# Patient Record
Sex: Female | Born: 1945 | Race: White | Hispanic: No | Marital: Single | Smoking: Never smoker
Health system: Southern US, Community
[De-identification: ages and names within clinical notes are randomized; demographics above are authoritative.]

## PROBLEM LIST (undated history)

## (undated) DIAGNOSIS — D649 Anemia, unspecified: Secondary | ICD-10-CM

## (undated) DIAGNOSIS — Z5189 Encounter for other specified aftercare: Secondary | ICD-10-CM

## (undated) DIAGNOSIS — J189 Pneumonia, unspecified organism: Secondary | ICD-10-CM

## (undated) DIAGNOSIS — I10 Essential (primary) hypertension: Secondary | ICD-10-CM

## (undated) DIAGNOSIS — C189 Malignant neoplasm of colon, unspecified: Secondary | ICD-10-CM

## (undated) DIAGNOSIS — C569 Malignant neoplasm of unspecified ovary: Secondary | ICD-10-CM

## (undated) DIAGNOSIS — R0602 Shortness of breath: Secondary | ICD-10-CM

## (undated) DIAGNOSIS — E119 Type 2 diabetes mellitus without complications: Secondary | ICD-10-CM

## (undated) DIAGNOSIS — E785 Hyperlipidemia, unspecified: Secondary | ICD-10-CM

## (undated) HISTORY — PX: COLON SURGERY: SHX602

## (undated) HISTORY — DX: Hyperlipidemia, unspecified: E78.5

## (undated) HISTORY — DX: Encounter for other specified aftercare: Z51.89

## (undated) HISTORY — DX: Pneumonia, unspecified organism: J18.9

## (undated) HISTORY — PX: WISDOM TOOTH EXTRACTION: SHX21

## (undated) HISTORY — DX: Essential (primary) hypertension: I10

## (undated) HISTORY — DX: Type 2 diabetes mellitus without complications: E11.9

---

## 2011-07-14 DIAGNOSIS — C569 Malignant neoplasm of unspecified ovary: Secondary | ICD-10-CM

## 2011-07-14 DIAGNOSIS — C189 Malignant neoplasm of colon, unspecified: Secondary | ICD-10-CM

## 2011-07-14 HISTORY — DX: Malignant neoplasm of colon, unspecified: C18.9

## 2011-07-14 HISTORY — PX: PORTACATH PLACEMENT: SHX2246

## 2011-07-14 HISTORY — DX: Malignant neoplasm of unspecified ovary: C56.9

## 2011-12-15 ENCOUNTER — Encounter: Payer: Self-pay | Admitting: Internal Medicine

## 2012-01-12 ENCOUNTER — Ambulatory Visit (AMBULATORY_SURGERY_CENTER): Payer: Medicare Other | Admitting: *Deleted

## 2012-01-12 VITALS — Ht 63.5 in | Wt 169.8 lb

## 2012-01-12 DIAGNOSIS — Z1211 Encounter for screening for malignant neoplasm of colon: Secondary | ICD-10-CM

## 2012-01-26 ENCOUNTER — Encounter: Payer: Self-pay | Admitting: Internal Medicine

## 2012-01-26 ENCOUNTER — Ambulatory Visit (AMBULATORY_SURGERY_CENTER): Payer: Medicare Other | Admitting: Internal Medicine

## 2012-01-26 VITALS — BP 110/59 | HR 100 | Temp 98.1°F | Resp 15 | Ht 63.5 in | Wt 169.0 lb

## 2012-01-26 DIAGNOSIS — Z1211 Encounter for screening for malignant neoplasm of colon: Secondary | ICD-10-CM

## 2012-01-26 DIAGNOSIS — K633 Ulcer of intestine: Secondary | ICD-10-CM

## 2012-01-26 DIAGNOSIS — K5730 Diverticulosis of large intestine without perforation or abscess without bleeding: Secondary | ICD-10-CM

## 2012-01-26 MED ORDER — SODIUM CHLORIDE 0.9 % IV SOLN: 500.0000 mL | INTRAVENOUS | Status: DC

## 2012-01-26 NOTE — Patient Instructions (Addendum)
There was an ulcer in the colon - the right side of the colon. I have taken biopsies to find out what is causing it. You should hear from me about the biopsy results and plans this week.  Thank you for choosing me and [facility].  Iva Boop, MD, FACG   YOU HAD AN ENDOSCOPIC PROCEDURE TODAY AT THE [facility]: Refer to the procedure report that was given to you for any specific questions about what was found during the examination.  If the procedure report does not answer your questions, please call your gastroenterologist to clarify.  If you requested that your care partner not be given the details of your procedure findings, then the procedure report has been included in a sealed envelope for you to review at your convenience later.  YOU SHOULD EXPECT: Some feelings of bloating in the abdomen. Passage of more gas than usual.  Walking can help get rid of the air that was put into your GI tract during the procedure and reduce the bloating. If you had a lower endoscopy (such as a colonoscopy or flexible sigmoidoscopy) you may notice spotting of blood in your stool or on the toilet paper. If you underwent a bowel prep for your procedure, then you may not have a normal bowel movement for a few days.  DIET: Your first meal following the procedure should be a light meal and then it is ok to progress to your normal diet.  A half-sandwich or bowl of soup is an example of a good first meal.  Heavy or fried foods are harder to digest and may make you feel nauseous or bloated.  Likewise meals heavy in dairy and vegetables can cause extra gas to form and this can also increase the bloating.  Drink plenty of fluids but you should avoid alcoholic beverages for 24 hours.  ACTIVITY: Your care partner should take you home directly after the procedure.  You should plan to take it easy, moving slowly for the rest of the day.  You can resume normal activity the day after the procedure  however you should NOT DRIVE or use heavy machinery for 24 hours (because of the sedation medicines used during the test).    SYMPTOMS TO REPORT IMMEDIATELY: A gastroenterologist can be reached at any hour.  During normal business hours, 8:30 AM to 5:00 PM Monday through Friday, call [phone].  After hours and on weekends, please call the GI answering service at [phone] who will take a message and have the physician on call contact you.   Following lower endoscopy (colonoscopy or flexible sigmoidoscopy):  Excessive amounts of blood in the stool  Significant tenderness or worsening of abdominal pains  Swelling of the abdomen that is new, acute  Fever of 100F or higher  Following upper endoscopy (EGD)  Vomiting of blood or coffee ground material  New chest pain or pain under the shoulder blades  Painful or persistently difficult swallowing  New shortness of breath  Fever of 100F or higher  Black, tarry-looking stools  FOLLOW UP: If any biopsies were taken you will be contacted by phone or by letter within the next 1-3 weeks.  Call your gastroenterologist if you have not heard about the biopsies in 3 weeks.  Our staff will call the home number listed on your records the next business day following your procedure to check on you and address any questions or concerns that you may have at that time regarding the information  given to you following your procedure. This is a courtesy call and so if there is no answer at the home number and we have not heard from you through the emergency physician on call, we will assume that you have returned to your regular daily activities without incident.  SIGNATURES/CONFIDENTIALITY: You and/or your care partner have signed paperwork which will be entered into your electronic medical record.  These signatures attest to the fact that that the information above on your After Visit Summary has been reviewed and is understood.  Full responsibility of  the confidentiality of this discharge information lies with you and/or your care-partner.   Diverticulosis, high fiber diet, hemorrhoid information given.

## 2012-01-26 NOTE — Progress Notes (Signed)
PT. Stated that she noticed a knot under her nabel 2 weeks ago and her stomach stays swollen. She stated she told her primary doctor and she said her doctor stated it is coming from IBS. Instructed pt. To inform DR. Leone Payor when she talk to him prior to procedure,

## 2012-01-26 NOTE — Progress Notes (Signed)
Patient did not experience any of the following events: a burn prior to discharge; a fall within the facility; wrong site/side/patient/procedure/implant event; or a hospital transfer or hospital admission upon discharge from the facility. (G8907) Patient did not have preoperative order for IV antibiotic SSI prophylaxis. (G8918)  

## 2012-01-26 NOTE — Op Note (Signed)
[facility] [address] [place], [place]  [address]  COLONOSCOPY PROCEDURE REPORT  PATIENT:  Ann Baldwin, Ann Baldwin  MR#:  [id number] BIRTHDATE:  27-Oct-1945, 66 yrs. old  GENDER:  female ENDOSCOPIST:  Iva Boop, MD, [facility] REF. BY:  Paulita Cradle, N.P. PROCEDURE DATE:  01/26/2012 PROCEDURE:  Colonoscopy with biopsy ASA CLASS:  Class III INDICATIONS:  Routine Risk Screening MEDICATIONS:   These medications were titrated to patient response per physician's verbal order, MAC sedation, administered by CRNA, propofol (Diprivan) 250 mg IV  DESCRIPTION OF PROCEDURE:   After the risks benefits and alternatives of the procedure were thoroughly explained, informed consent was obtained.  Digital rectal exam was performed and revealed no abnormalities.   The LB [id number]-[id number] endoscope was introduced through the anus and advanced to the cecum, which was identified by both the appendix and ileocecal valve, without limitations.  The quality of the prep was excellent, using MoviPrep.  The instrument was then slowly withdrawn as the colon was fully examined. <<PROCEDUREIMAGES>>  FINDINGS:  An ulcer was found in the ascending colon. Large ulcer, firm with heaped up margins. Difficult to see and obtain biopsies. Position changes and abdominal pressure utilized.  This was otherwise a normal examination of the colon, though colon was redundant.  Retroflexed views in the rectum revealed internal and external hemorrhoids.    The time to cecum = 12:20 minutes. The scope was then withdrawn in 17 minutes from the cecum and the procedure completed. COMPLICATIONS:  None ENDOSCOPIC IMPRESSION: 1) Ulcer in the ascending colon - biopsied 2) Internal and external hemorrhoids 3) Otherwise normal examination - excellent prep RECOMMENDATIONS: 1) Await biopsy results 2) I will call with results and plans. REPEAT EXAM:  In for Colonoscopy, pending biopsy results.  Iva Boop, MD,  Clementeen Graham  CC:  Paulita Cradle, NP and The Patient  n. eSIGNED:   Iva Boop at 01/26/2012 10:58 AM  Clydene Fake, [id number]

## 2012-01-26 NOTE — Progress Notes (Signed)
1026 SA02 100%, CONTINUES WITH MASK 02

## 2012-01-26 NOTE — Progress Notes (Signed)
1020PATIENT DESATURATED TO 83, INCREASE OF 02 AND MASK APPLIED, SA02 INCREASED TO 94%. COLOR REMAINS THE SAME AS PREPROCEDURE. SKIN WARM DRY, NAILBEDS PINK.RESP SLOW EVEN

## 2012-01-27 ENCOUNTER — Telehealth: Payer: Self-pay | Admitting: *Deleted

## 2012-01-27 NOTE — Telephone Encounter (Signed)
  Follow up Call-  Call back number 01/26/2012  Post procedure Call Back phone  # [phone]  Permission to leave phone message Yes     [facility]

## 2012-01-29 ENCOUNTER — Other Ambulatory Visit: Payer: Self-pay

## 2012-01-29 DIAGNOSIS — R140 Abdominal distension (gaseous): Secondary | ICD-10-CM

## 2012-01-29 DIAGNOSIS — K633 Ulcer of intestine: Secondary | ICD-10-CM

## 2012-01-29 DIAGNOSIS — R188 Other ascites: Secondary | ICD-10-CM

## 2012-01-29 NOTE — Progress Notes (Signed)
Quick Note:  I called result to her Discussed with Paulita Cradle, NP at [facility]  BUN 15, creat 1.48 and GFR 36 on 01/18/12  Office:  Please order CT abd/pelvis with contrast re: malignant right colon ulcer, abdominal distention, suspected ascites, ? Metastatic cancer  Patient expecting call  [facility] - no recall or letter ______

## 2012-01-29 NOTE — Progress Notes (Signed)
Have set up patient's CT abd/pelvis with contrast for 02/01/12, arrive at 11:15am for a 1:30pm scan at [facility] located at [address], [place] [address].  Have tried to call pt and LM for them to call me back.  Will continue to try and reach patient until I contact her.

## 2012-02-01 ENCOUNTER — Ambulatory Visit (INDEPENDENT_AMBULATORY_CARE_PROVIDER_SITE_OTHER)
Admission: RE | Admit: 2012-02-01 | Discharge: 2012-02-01 | Disposition: A | Discharge status: Home / Self Care | Payer: Medicaid Other | Source: Ambulatory Visit | Attending: Internal Medicine | Admitting: Internal Medicine

## 2012-02-01 DIAGNOSIS — R140 Abdominal distension (gaseous): Secondary | ICD-10-CM

## 2012-02-01 DIAGNOSIS — R141 Gas pain: Secondary | ICD-10-CM

## 2012-02-01 DIAGNOSIS — R188 Other ascites: Secondary | ICD-10-CM

## 2012-02-01 DIAGNOSIS — K633 Ulcer of intestine: Secondary | ICD-10-CM

## 2012-02-01 MED ORDER — IOHEXOL 300 MG/ML  SOLN
80.0000 mL | Freq: Once | INTRAMUSCULAR | Status: AC | PRN
  Administered 2012-02-01: 80 mL via INTRAVENOUS

## 2012-02-01 NOTE — Progress Notes (Signed)
Quick Note:  I called results Will get GYN Oncology appointment   ______

## 2012-02-02 ENCOUNTER — Other Ambulatory Visit: Payer: Self-pay | Admitting: *Deleted

## 2012-02-02 ENCOUNTER — Telehealth: Payer: Self-pay | Admitting: *Deleted

## 2012-02-02 DIAGNOSIS — K633 Ulcer of intestine: Secondary | ICD-10-CM

## 2012-02-02 DIAGNOSIS — R933 Abnormal findings on diagnostic imaging of other parts of digestive tract: Secondary | ICD-10-CM

## 2012-02-02 NOTE — Progress Notes (Signed)
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

## 2012-02-02 NOTE — Telephone Encounter (Signed)
Spoke with Dagmar Hait at [facility] oncology and the patient can be seen on 02/09/12 9:00/9:30 AM by Dr. Valentino Nose. Valet parking is available. Patient notified.

## 2012-02-03 ENCOUNTER — Telehealth: Payer: Self-pay | Admitting: Internal Medicine

## 2012-02-03 NOTE — Telephone Encounter (Signed)
Forward 3 pages to Dr. Stan Head for review on 02-03-12 ym

## 2012-02-08 ENCOUNTER — Encounter: Payer: Self-pay | Admitting: *Deleted

## 2012-02-09 ENCOUNTER — Other Ambulatory Visit (HOSPITAL_COMMUNITY)
Admission: RE | Admit: 2012-02-09 | Discharge: 2012-02-09 | Disposition: A | Discharge status: Home / Self Care | Payer: Medicare Other | Source: Ambulatory Visit | Attending: Gynecologic Oncology | Admitting: Gynecologic Oncology

## 2012-02-09 ENCOUNTER — Ambulatory Visit (HOSPITAL_BASED_OUTPATIENT_CLINIC_OR_DEPARTMENT_OTHER): Payer: Medicare Other | Admitting: Lab

## 2012-02-09 ENCOUNTER — Encounter: Payer: Self-pay | Admitting: Gynecologic Oncology

## 2012-02-09 ENCOUNTER — Ambulatory Visit: Payer: Medicare Other | Admitting: Gynecologic Oncology

## 2012-02-09 VITALS — BP 100/58 | HR 98 | Temp 97.5°F | Resp 18 | Ht 63.78 in | Wt 168.5 lb

## 2012-02-09 DIAGNOSIS — K6389 Other specified diseases of intestine: Secondary | ICD-10-CM

## 2012-02-09 DIAGNOSIS — R197 Diarrhea, unspecified: Secondary | ICD-10-CM

## 2012-02-09 DIAGNOSIS — K633 Ulcer of intestine: Secondary | ICD-10-CM

## 2012-02-09 DIAGNOSIS — Z124 Encounter for screening for malignant neoplasm of cervix: Secondary | ICD-10-CM | POA: Insufficient documentation

## 2012-02-09 DIAGNOSIS — E119 Type 2 diabetes mellitus without complications: Secondary | ICD-10-CM | POA: Insufficient documentation

## 2012-02-09 DIAGNOSIS — C786 Secondary malignant neoplasm of retroperitoneum and peritoneum: Secondary | ICD-10-CM | POA: Insufficient documentation

## 2012-02-09 DIAGNOSIS — R1909 Other intra-abdominal and pelvic swelling, mass and lump: Secondary | ICD-10-CM

## 2012-02-09 DIAGNOSIS — E785 Hyperlipidemia, unspecified: Secondary | ICD-10-CM | POA: Insufficient documentation

## 2012-02-09 DIAGNOSIS — C260 Malignant neoplasm of intestinal tract, part unspecified: Secondary | ICD-10-CM

## 2012-02-09 DIAGNOSIS — R188 Other ascites: Secondary | ICD-10-CM

## 2012-02-09 DIAGNOSIS — I10 Essential (primary) hypertension: Secondary | ICD-10-CM | POA: Insufficient documentation

## 2012-02-09 LAB — CEA: CEA: 0.5 ng/mL (ref 0.0–5.0)

## 2012-02-09 MED ORDER — HYDROCODONE-ACETAMINOPHEN 5-325 MG PO TABS: 1.0000 | ORAL_TABLET | ORAL | Status: DC | PRN

## 2012-02-09 MED ORDER — ONDANSETRON HCL 8 MG PO TABS: 4.0000 mg | ORAL_TABLET | Freq: Three times a day (TID) | ORAL | Status: DC | PRN

## 2012-02-09 NOTE — Patient Instructions (Addendum)
Immunostains will be collected from the biopsy. CEA, CA 125 Patient will be presented at GI tumor board.  Had a discussion with Dr. Gaynelle Adu.  If this represents metastatic colon cancer neoadjuvant chemotherapy is the best treatment course.   Patient to f/u 02/18/2012 with Gyn Onc for treatment planning. Referral to Dr. Darrold Span for neoadjuvant chemotherapy   Thank you very much Ms. [name] for allowing me to provide care for you today.  I appreciate your confidence in choosing our Gynecologic Oncology team.  If you have any questions about your visit today please call our office and we will get back to you as soon as possible.  Maryclare Labrador. Korin Hartwell MD., PhD Gynecologic Oncology

## 2012-02-09 NOTE — Progress Notes (Signed)
Consult Note: Gyn-Onc  Consult was requested by Dr. Leone Payor for the evaluation of Ann Baldwin 66 y.o. female  CC:  Chief Complaint  Patient presents with  . Ascites    New consult    HPI: 66 y/o G2P2 LNMP 40's  presented in April 2013 with abdominal discomfort, diarrhea, nausea, no constipation  that was thought to be IBD. The symptoms persisted and she was referred for colonoscopy 01/26/2012.  Findings were notable for  A large ulcer with heaped up margins in the ascending colon.  It was difficult to see and to obtain biopsies.    Pathology was c/w adenocarcinoma and the concern that it was not a primary colonic adenocarcinoma.  The patient denies rectal bleeding or weight loss, she reports poor appetite, abdominal distension for two months.  Early satiety.  No vaginal bleeding.  CT abdomen and pelvis 02/01/2012 with a 5x6.2 cmcomplex solid and cystic left adnexal mass, 3.9x2.9 right ovarian soft tissue mass, omental caking and peritoneal carcinomatosis.    Current Meds:  Outpatient Encounter Prescriptions as of 02/09/2012  Medication Sig Dispense Refill  . amLODipine-benazepril (LOTREL) 10-20 MG per capsule Take 1 capsule by mouth daily.      Marland Kitchen aspirin 81 MG tablet Take 81 mg by mouth daily.      Marland Kitchen atorvastatin (LIPITOR) 40 MG tablet Take 40 mg by mouth daily.      Marland Kitchen ezetimibe (ZETIA) 10 MG tablet Take 10 mg by mouth daily.      . furosemide (LASIX) 20 MG tablet Take 20 mg by mouth daily.      Marland Kitchen ibuprofen (ADVIL,MOTRIN) 600 MG tablet Take 600 mg by mouth every 6 (six) hours as needed.      . lubiprostone (AMITIZA) 8 MCG capsule Take 8 mcg by mouth 2 (two) times daily with a meal.      . sitaGLIPtin (JANUVIA) 100 MG tablet Take 100 mg by mouth daily.        Allergy: No Known Allergies  Social Hx:   History   Social History  . Marital Status: Single    Spouse Name: N/A    Number of Children: N/A  . Years of Education: N/A   Occupational History  . Not on file.   Social  History Main Topics  . Smoking status: Never Smoker   . Smokeless tobacco: Never Used  . Alcohol Use: No  . Drug Use: No  . Sexually Active: No   Other Topics Concern  . Not on file   Social History Narrative  . No narrative on file    Past Surgical Hx: No past surgical history on file.  Past Medical Hx:  Past Medical History  Diagnosis Date  . Diabetes mellitus   . Hyperlipidemia   . Hypertension   Last mammogram 2 years ago wnl.  Past Gynecological History: G2 P2 LNMP in 40's.  Menarche 12 regular menses until menopause.  No contraceptives used.  No h/o abnormal pap test, Last pap 10 years ago.  NSVD x 2.  No LMP recorded. Patient is postmenopausal.  Family Hx:  Family History  Problem Relation Age of Onset  . Colon cancer Neg Hx   . Esophageal cancer Neg Hx   . Stomach cancer Neg Hx   . Rectal cancer Neg Hx     Review of Systems:  Constitutional  Feels poorly Cardiovascular  No chest pain, shortness of breath, sleeps on t wo pillows. Pulmonary  Reports cough with yellow sputum. No wheeze.  Gastro Intestinal  Reports nausea, dry heaves and diarrhoea. No bright red blood per rectum.  Reports abdominal tightness.  Flat pencil like stool. No constipation.  Genito Urinary  No frequency, urgency, dysuria, no vaginal bleeding or discharge Musculo Skeletal  No myalgia, arthralgia, joint swelling or pain  Neurologic  No weakness, numbness, change in gait,  Psychology  No depression, anxiety,difficulty sleeping.   Vitals:  Blood pressure 100/58, pulse 98, temperature 97.5 F (36.4 C), temperature source Oral, resp. rate 18, height 5' 3.78" (1.62 m), weight 168 lb 8 oz (76.431 kg).  Physical Exam: Thin WD in NAD.  Bitemporal wasting. Neck  Supple NROM, without any enlargements.  Lymph Node Survey No cervical supraclavicular or inguinal adenopathy Cardiovascular  Pulse normal rate, regularity and rhythm. Lungs  Clear to auscultation bilateraly, without  wheezes/crackles/rhonchi.DEcreased breath sounds at bases Skin  Poor skin turgor.  Evidence of weigh loss. Psychiatry  Alert and oriented to person, place, and time, flat affect. Abdomen  Distended c/w ascites.  Normoactive bowel sounds, abdomen soft, non-tender and obese. No palpable masses. Back No CVA tenderness Genito Urinary  Vulva/vagina: Normal external female genitalia.  No lesions. No discharge or bleeding.  Bladder/urethra:  No lesions or masses  Vagina:atrophic, no masses, discharge or bleeding.  Cervix: Normal appearing, no lesions.  Uterus: Small, mobile, no parametrial involvement or nodularity.  Adnexa: No palpable masses. Rectal  Good tone, no masses no cul de sac nodularity.  Extremities  No bilateral cyanosis, clubbing or edema.   Assessment/Plan:  Ann Baldwin  is a 66 y.o.  year old with ascites,an  ulcerative lesion of the ascending colon and diffuse  peritotoneal carcinomatosis.  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  Dr. Colonel Bald will perform additional immunostains noted on pathology report.  Per his communication with Dr. Leone Payor then was suspicion that the lesion was invading into the colon.   Call placed to Dr. Colonel Bald to request additional immunostains. CEA, CA 125 Patient will be presented at GI tumor board.  Had a discussion with Dr. Gaynelle Adu.  If this represents metastatic colon cancer neoadjuvant chemotherapy is the best treatment course.   If this is a Gyn primary the options are neoadjuvant chemotherapy versus primary debulking. F/U 02/18/2012       Laurette Schimke, MD, PhD 02/09/2012, 9:24 AM

## 2012-02-10 ENCOUNTER — Other Ambulatory Visit: Payer: Self-pay | Admitting: *Deleted

## 2012-02-10 ENCOUNTER — Telehealth: Payer: Self-pay | Admitting: Gynecologic Oncology

## 2012-02-10 ENCOUNTER — Encounter (HOSPITAL_COMMUNITY): Payer: Self-pay | Admitting: *Deleted

## 2012-02-10 ENCOUNTER — Emergency Department (HOSPITAL_COMMUNITY): Payer: Medicare Other

## 2012-02-10 ENCOUNTER — Other Ambulatory Visit: Payer: Self-pay | Admitting: Lab

## 2012-02-10 ENCOUNTER — Other Ambulatory Visit: Payer: Self-pay

## 2012-02-10 ENCOUNTER — Inpatient Hospital Stay (HOSPITAL_COMMUNITY)
Admission: EM | Admit: 2012-02-10 | Discharge: 2012-02-17 | DRG: 683 | Disposition: A | Discharge status: Home / Self Care | Payer: Medicare Other | Attending: Internal Medicine | Admitting: Internal Medicine

## 2012-02-10 DIAGNOSIS — E785 Hyperlipidemia, unspecified: Secondary | ICD-10-CM | POA: Diagnosis present

## 2012-02-10 DIAGNOSIS — C786 Secondary malignant neoplasm of retroperitoneum and peritoneum: Secondary | ICD-10-CM | POA: Diagnosis present

## 2012-02-10 DIAGNOSIS — N179 Acute kidney failure, unspecified: Secondary | ICD-10-CM

## 2012-02-10 DIAGNOSIS — N838 Other noninflammatory disorders of ovary, fallopian tube and broad ligament: Secondary | ICD-10-CM | POA: Diagnosis present

## 2012-02-10 DIAGNOSIS — R110 Nausea: Secondary | ICD-10-CM | POA: Diagnosis present

## 2012-02-10 DIAGNOSIS — N19 Unspecified kidney failure: Secondary | ICD-10-CM

## 2012-02-10 DIAGNOSIS — C569 Malignant neoplasm of unspecified ovary: Secondary | ICD-10-CM | POA: Diagnosis present

## 2012-02-10 DIAGNOSIS — E119 Type 2 diabetes mellitus without complications: Secondary | ICD-10-CM | POA: Diagnosis present

## 2012-02-10 DIAGNOSIS — R1900 Intra-abdominal and pelvic swelling, mass and lump, unspecified site: Secondary | ICD-10-CM

## 2012-02-10 DIAGNOSIS — I959 Hypotension, unspecified: Secondary | ICD-10-CM | POA: Diagnosis present

## 2012-02-10 DIAGNOSIS — R0902 Hypoxemia: Secondary | ICD-10-CM | POA: Diagnosis present

## 2012-02-10 DIAGNOSIS — N839 Noninflammatory disorder of ovary, fallopian tube and broad ligament, unspecified: Secondary | ICD-10-CM

## 2012-02-10 DIAGNOSIS — I10 Essential (primary) hypertension: Secondary | ICD-10-CM | POA: Diagnosis present

## 2012-02-10 DIAGNOSIS — R188 Other ascites: Secondary | ICD-10-CM | POA: Diagnosis present

## 2012-02-10 HISTORY — DX: Malignant neoplasm of unspecified ovary: C56.9

## 2012-02-10 LAB — CBC
HCT: 31.8 % — ABNORMAL LOW (ref 36.0–46.0)
Hemoglobin: 10.7 g/dL — ABNORMAL LOW (ref 12.0–15.0)
MCH: 29.1 pg (ref 26.0–34.0)
MCHC: 33.6 g/dL (ref 30.0–36.0)
MCV: 86.4 fL (ref 78.0–100.0)
Platelets: 407 10*3/uL — ABNORMAL HIGH (ref 150–400)
RBC: 3.68 MIL/uL — ABNORMAL LOW (ref 3.87–5.11)
RDW: 15.7 % — ABNORMAL HIGH (ref 11.5–15.5)
WBC: 5.3 10*3/uL (ref 4.0–10.5)

## 2012-02-10 LAB — URINALYSIS, ROUTINE W REFLEX MICROSCOPIC
Glucose, UA: NEGATIVE mg/dL
Leukocytes, UA: NEGATIVE
Nitrite: NEGATIVE
Protein, ur: NEGATIVE mg/dL
Specific Gravity, Urine: 1.032 — ABNORMAL HIGH (ref 1.005–1.030)
Urobilinogen, UA: 0.2 mg/dL (ref 0.0–1.0)
pH: 5 (ref 5.0–8.0)

## 2012-02-10 LAB — BASIC METABOLIC PANEL
BUN: 68 mg/dL — ABNORMAL HIGH (ref 6–23)
CO2: 17 mEq/L — ABNORMAL LOW (ref 19–32)
Calcium: 9 mg/dL (ref 8.4–10.5)
Chloride: 97 mEq/L (ref 96–112)
Creatinine, Ser: 9.03 mg/dL — ABNORMAL HIGH (ref 0.50–1.10)
GFR calc Af Amer: 5 mL/min — ABNORMAL LOW (ref >90)
GFR calc non Af Amer: 4 mL/min — ABNORMAL LOW (ref >90)
Glucose, Bld: 93 mg/dL (ref 70–99)
Potassium: 4.3 mEq/L (ref 3.5–5.1)
Sodium: 135 mEq/L (ref 135–145)

## 2012-02-10 LAB — MAGNESIUM: Magnesium: 1.7 mg/dL (ref 1.5–2.5)

## 2012-02-10 LAB — COMPREHENSIVE METABOLIC PANEL
AST: 9 U/L (ref 0–37)
BUN: 63 mg/dL — ABNORMAL HIGH (ref 6–23)
CO2: 18 mEq/L — ABNORMAL LOW (ref 19–32)
Calcium: 8.8 mg/dL (ref 8.4–10.5)
Chloride: 101 mEq/L (ref 96–112)
Creatinine, Ser: 8.78 mg/dL — ABNORMAL HIGH (ref 0.50–1.10)

## 2012-02-10 LAB — URINE MICROSCOPIC-ADD ON

## 2012-02-10 LAB — GLUCOSE, CAPILLARY: Glucose-Capillary: 78 mg/dL (ref 70–99)

## 2012-02-10 LAB — PHOSPHORUS: Phosphorus: 7.5 mg/dL — ABNORMAL HIGH (ref 2.3–4.6)

## 2012-02-10 MED ORDER — ONDANSETRON HCL 4 MG PO TABS
4.0000 mg | ORAL_TABLET | Freq: Three times a day (TID) | ORAL | Status: DC | PRN
  Administered 2012-02-11 – 2012-02-16 (×3): 4 mg via ORAL
  Filled 2012-02-10 (×3): qty 1

## 2012-02-10 MED ORDER — ATORVASTATIN CALCIUM 40 MG PO TABS
40.0000 mg | ORAL_TABLET | Freq: Every day | ORAL | Status: DC
  Administered 2012-02-11 – 2012-02-17 (×7): 40 mg via ORAL
  Filled 2012-02-10 (×7): qty 1

## 2012-02-10 MED ORDER — SODIUM CHLORIDE 0.9 % IV SOLN
INTRAVENOUS | Status: AC
  Administered 2012-02-10 – 2012-02-11 (×2): via INTRAVENOUS

## 2012-02-10 MED ORDER — SODIUM CHLORIDE 0.9 % IJ SOLN
3.0000 mL | Freq: Two times a day (BID) | INTRAMUSCULAR | Status: DC
  Administered 2012-02-10 – 2012-02-17 (×7): 3 mL via INTRAVENOUS

## 2012-02-10 MED ORDER — EZETIMIBE 10 MG PO TABS
10.0000 mg | ORAL_TABLET | Freq: Every day | ORAL | Status: DC
  Administered 2012-02-11 – 2012-02-17 (×7): 10 mg via ORAL
  Filled 2012-02-10 (×7): qty 1

## 2012-02-10 MED ORDER — SODIUM CHLORIDE 0.9 % IV BOLUS (SEPSIS)
1000.0000 mL | Freq: Once | INTRAVENOUS | Status: AC
  Administered 2012-02-10: 1000 mL via INTRAVENOUS

## 2012-02-10 MED ORDER — ZOLPIDEM TARTRATE 5 MG PO TABS
5.0000 mg | ORAL_TABLET | Freq: Every evening | ORAL | Status: DC | PRN
  Administered 2012-02-10 – 2012-02-16 (×4): 5 mg via ORAL
  Filled 2012-02-10 (×4): qty 1

## 2012-02-10 MED ORDER — HYDROCODONE-ACETAMINOPHEN 5-325 MG PO TABS
1.0000 | ORAL_TABLET | ORAL | Status: DC | PRN
  Administered 2012-02-10 – 2012-02-16 (×9): 1 via ORAL
  Filled 2012-02-10 (×9): qty 1

## 2012-02-10 MED ORDER — ENOXAPARIN SODIUM 30 MG/0.3ML ~~LOC~~ SOLN
30.0000 mg | SUBCUTANEOUS | Status: DC
  Administered 2012-02-10 – 2012-02-16 (×7): 30 mg via SUBCUTANEOUS
  Filled 2012-02-10 (×8): qty 0.3

## 2012-02-10 MED ORDER — INSULIN ASPART 100 UNIT/ML ~~LOC~~ SOLN
0.0000 [IU] | Freq: Three times a day (TID) | SUBCUTANEOUS | Status: DC
  Administered 2012-02-12 – 2012-02-17 (×5): 1 [IU] via SUBCUTANEOUS

## 2012-02-10 NOTE — Telephone Encounter (Signed)
Ann Baldwin was  informed that there is renal insufficiency creatinine 8.78.  Advised to bring her mother to the ER as [name] for admission to the hospitalist service.

## 2012-02-10 NOTE — ED Notes (Signed)
MD at bedside. 

## 2012-02-10 NOTE — ED Notes (Signed)
Placed fall risk bracelet and red socks on pt.  

## 2012-02-10 NOTE — H&P (Addendum)
Ann Baldwin is an 66 y.o. female.   Chief Complaint: ARF HPI: 66 yo female with Dm2, Hypertension, Hyperlipidemia, new dx of ovarian mass likely ovarian cancer has ARF.  Told by pcp to come to ER.  Note that creatiine 1.48 on 01/18/2012 per notes in computer.  Pt has been taking motrin up until 2-3 days ago.  Pt had CT scan with dye exposure.  Pt has had n/v, diarrhea for 1 month.   Pt denies fever, chills, cp, palp, sob, orthopnea, lower ext edema.   In ED, pt found t ohave creatinine 9.03.  Pt will be admitted for ARF, and w/up of ovarian masses.   Past Medical History  Diagnosis Date  . Diabetes mellitus   . Hyperlipidemia   . Hypertension   . Ovarian cancer     Past Surgical History  Procedure Date  . No past surgeries     Family History  Problem Relation Age of Onset  . Colon cancer Neg Hx   . Esophageal cancer Neg Hx   . Stomach cancer Neg Hx   . Rectal cancer Neg Hx    Social History:  reports that she has never smoked. She has never used smokeless tobacco. She reports that she does not drink alcohol or use illicit drugs.  Allergies: No Known Allergies   (Not in a hospital admission)  Results for orders placed during the hospital encounter of 02/10/12 (from the past 48 hour(s))  BASIC METABOLIC PANEL     Status: Abnormal   Collection Time   02/10/12  5:40 PM      Component Value Range Comment   Sodium 135  135 - 145 mEq/L    Potassium 4.3  3.5 - 5.1 mEq/L    Chloride 97  96 - 112 mEq/L    CO2 17 (*) 19 - 32 mEq/L    Glucose, Bld 93  70 - 99 mg/dL    BUN 68 (*) 6 - 23 mg/dL    Creatinine, Ser 8.65 (*) 0.50 - 1.10 mg/dL    Calcium 9.0  8.4 - 78.4 mg/dL    GFR calc non Af Amer 4 (*) >90 mL/min    GFR calc Af Amer 5 (*) >90 mL/min   MAGNESIUM     Status: Normal   Collection Time   02/10/12  5:40 PM      Component Value Range Comment   Magnesium 1.7  1.5 - 2.5 mg/dL   PHOSPHORUS     Status: Abnormal   Collection Time   02/10/12  5:40 PM      Component Value  Range Comment   Phosphorus 7.5 (*) 2.3 - 4.6 mg/dL   CBC     Status: Abnormal   Collection Time   02/10/12  5:40 PM      Component Value Range Comment   WBC 5.3  4.0 - 10.5 K/uL    RBC 3.68 (*) 3.87 - 5.11 MIL/uL    Hemoglobin 10.7 (*) 12.0 - 15.0 g/dL    HCT 69.6 (*) 29.5 - 46.0 %    MCV 86.4  78.0 - 100.0 fL    MCH 29.1  26.0 - 34.0 pg    MCHC 33.6  30.0 - 36.0 g/dL    RDW 28.4 (*) 13.2 - 15.5 %    Platelets 407 (*) 150 - 400 K/uL   URINALYSIS, ROUTINE W REFLEX MICROSCOPIC     Status: Abnormal   Collection Time   02/10/12  6:36 PM  Component Value Range Comment   Color, Urine AMBER (*) YELLOW BIOCHEMICALS MAY BE AFFECTED BY COLOR   APPearance CLOUDY (*) CLEAR    Specific Gravity, Urine 1.032 (*) 1.005 - 1.030    pH 5.0  5.0 - 8.0    Glucose, UA NEGATIVE  NEGATIVE mg/dL    Hgb urine dipstick TRACE (*) NEGATIVE    Bilirubin Urine SMALL (*) NEGATIVE    Ketones, ur TRACE (*) NEGATIVE mg/dL    Protein, ur NEGATIVE  NEGATIVE mg/dL    Urobilinogen, UA 0.2  0.0 - 1.0 mg/dL    Nitrite NEGATIVE  NEGATIVE    Leukocytes, UA NEGATIVE  NEGATIVE   URINE MICROSCOPIC-ADD ON     Status: Normal   Collection Time   02/10/12  6:36 PM      Component Value Range Comment   RBC / HPF 0-2  <3 RBC/hpf    Urine-Other AMORPHOUS URATES/PHOSPHATES      No results found.  Review of Systems  Constitutional: Positive for weight loss. Negative for fever, chills, malaise/fatigue and diaphoresis.  HENT: Negative for hearing loss, ear pain, nosebleeds, congestion, neck pain, tinnitus and ear discharge.   Eyes: Negative for blurred vision, double vision, photophobia, pain and discharge.  Respiratory: Negative for cough, hemoptysis, sputum production, shortness of breath, wheezing and stridor.   Cardiovascular: Positive for claudication. Negative for chest pain, palpitations, orthopnea, leg swelling and PND.  Gastrointestinal: Positive for nausea and diarrhea. Negative for heartburn, vomiting, abdominal  pain, constipation and blood in stool.  Genitourinary: Negative for dysuria, urgency, frequency and hematuria.       Decreased urine output per family  Musculoskeletal: Negative for myalgias, back pain and joint pain.  Skin: Negative for itching and rash.  Neurological: Negative for dizziness, tingling, tremors, sensory change, speech change, focal weakness, seizures, weakness and headaches.  Endo/Heme/Allergies: Negative for environmental allergies and polydipsia. Does not bruise/bleed easily.  Psychiatric/Behavioral: Negative for depression, suicidal ideas, hallucinations and substance abuse. The patient is not nervous/anxious and does not have insomnia.     Blood pressure 110/54, pulse 101, temperature 98 F (36.7 C), temperature source Oral, resp. rate 10, SpO2 90.00%. Physical Exam  Constitutional: She is oriented to person, place, and time. She appears well-developed and well-nourished. No distress.  HENT:  Head: Normocephalic and atraumatic.  Nose: Nose normal.  Mouth/Throat: No oropharyngeal exudate.  Eyes: Conjunctivae and EOM are normal. Pupils are equal, round, and reactive to light. Right eye exhibits no discharge. Left eye exhibits no discharge. No scleral icterus.  Neck: Normal range of motion. Neck supple. No JVD present. No tracheal deviation present. No thyromegaly present.  Cardiovascular: Normal rate, regular rhythm and normal heart sounds.  Exam reveals no gallop and no friction rub.   No murmur heard. Respiratory: Effort normal and breath sounds normal. No stridor. No respiratory distress. She has no wheezes. She has no rales. She exhibits no tenderness.  GI: Soft. Bowel sounds are normal. She exhibits distension and mass. There is no tenderness. There is no rebound and no guarding.       + distention, dullness to percussion  Musculoskeletal: Normal range of motion. She exhibits no edema and no tenderness.  Lymphadenopathy:    She has no cervical adenopathy.    Neurological: She is alert and oriented to person, place, and time. She has normal reflexes. She displays normal reflexes. No cranial nerve deficit. She exhibits normal muscle tone. Coordination normal.  Skin: Skin is warm and dry. No rash noted. She  is not diaphoretic. No erythema. No pallor.  Psychiatric: She has a normal mood and affect. Her behavior is normal. Judgment and thought content normal.     Assessment/Plan ARF ddx ace-i, + diarrrhea, + contrast + nsaids Check urine sodium , creatinine,  Eosinophils I have contacted nephrology Stop lasix, stop amlodipine benazepril Stop ibuprofen  Ovarian mass:  CA 125 elevated Pt will need oncology consult in am  Ascites: await ct scan results, may need paracentesis  Dm2 Fsbs ac and qhs, iss Stop januvia  Borderline hypoxemia VQ scan in am  Pearson Grippe 02/10/2012, 9:03 PM

## 2012-02-10 NOTE — Telephone Encounter (Signed)
Spoke with Ann Baldwin -the patient's daughter- regarding the CEA, CA 125 and results of the immuno-histochemistry stains of the colon biopsy.  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  The plan is for 3-4 cycles of neoadjuvant paclitaxel/carboplatin with interval surgery if there is clinical improvement.  Ann Baldwin will discuss this plan with her mother Ann Baldwin.  Advised to keep the appointments with Dr. Darrold Span and myself.

## 2012-02-10 NOTE — ED Notes (Signed)
[facility] notified of pt's need for transfer to [facility].

## 2012-02-10 NOTE — ED Notes (Signed)
Family at bedside. 

## 2012-02-11 ENCOUNTER — Other Ambulatory Visit (HOSPITAL_COMMUNITY): Payer: Medicare Other

## 2012-02-11 ENCOUNTER — Inpatient Hospital Stay (HOSPITAL_COMMUNITY): Payer: Medicare Other

## 2012-02-11 ENCOUNTER — Other Ambulatory Visit: Payer: Self-pay | Admitting: *Deleted

## 2012-02-11 ENCOUNTER — Telehealth: Payer: Self-pay | Admitting: Oncology

## 2012-02-11 DIAGNOSIS — E785 Hyperlipidemia, unspecified: Secondary | ICD-10-CM | POA: Diagnosis present

## 2012-02-11 DIAGNOSIS — I10 Essential (primary) hypertension: Secondary | ICD-10-CM | POA: Diagnosis present

## 2012-02-11 DIAGNOSIS — R188 Other ascites: Secondary | ICD-10-CM | POA: Diagnosis present

## 2012-02-11 LAB — SODIUM, URINE, RANDOM: Sodium, Ur: 39 mEq/L

## 2012-02-11 LAB — AMYLASE, BODY FLUID: Amylase, Fluid: 32 U/L

## 2012-02-11 LAB — RETICULOCYTES
RBC.: 3.48 MIL/uL — ABNORMAL LOW (ref 3.87–5.11)
Retic Count, Absolute: 27.8 10*3/uL (ref 19.0–186.0)

## 2012-02-11 LAB — CALCIUM / CREATININE RATIO, URINE

## 2012-02-11 LAB — PROTEIN, BODY FLUID

## 2012-02-11 LAB — GLUCOSE, CAPILLARY
Glucose-Capillary: 89 mg/dL (ref 70–99)
Glucose-Capillary: 78 mg/dL (ref 70–99)
Glucose-Capillary: 59 mg/dL — ABNORMAL LOW (ref 70–99)
Glucose-Capillary: 122 mg/dL — ABNORMAL HIGH (ref 70–99)

## 2012-02-11 LAB — BODY FLUID CELL COUNT WITH DIFFERENTIAL
Monocyte-Macrophage-Serous Fluid: 24 % — ABNORMAL LOW (ref 50–90)
Total Nucleated Cell Count, Fluid: 1140 cu mm — ABNORMAL HIGH (ref 0–1000)

## 2012-02-11 MED ORDER — SODIUM CHLORIDE 0.9 % IV BOLUS (SEPSIS)
1000.0000 mL | Freq: Once | INTRAVENOUS | Status: AC
  Administered 2012-02-11: 1000 mL via INTRAVENOUS

## 2012-02-11 MED ORDER — ONDANSETRON HCL 4 MG/2ML IJ SOLN
4.0000 mg | Freq: Four times a day (QID) | INTRAMUSCULAR | Status: DC | PRN
  Administered 2012-02-12 – 2012-02-13 (×3): 4 mg via INTRAVENOUS
  Filled 2012-02-11 (×4): qty 2

## 2012-02-11 MED ORDER — ALUMINUM HYDROXIDE GEL 320 MG/5ML PO SUSP
30.0000 mL | Freq: Three times a day (TID) | ORAL | Status: DC
  Administered 2012-02-11 – 2012-02-12 (×3): 30 mL via ORAL
  Filled 2012-02-11 (×5): qty 30

## 2012-02-11 MED ORDER — SODIUM CHLORIDE 0.9 % IV SOLN: INTRAVENOUS | Status: DC

## 2012-02-11 MED ORDER — TECHNETIUM TO 99M ALBUMIN AGGREGATED
3.0000 | Freq: Once | INTRAVENOUS | Status: AC | PRN
  Administered 2012-02-11: 3 via INTRAVENOUS

## 2012-02-11 MED ORDER — GLUCOSE-VITAMIN C 4-6 GM-MG PO CHEW
CHEWABLE_TABLET | ORAL | Status: AC
  Administered 2012-02-11: 18:00:00
  Filled 2012-02-11: qty 1

## 2012-02-11 MED ORDER — DEXTROSE-NACL 5-0.45 % IV SOLN
INTRAVENOUS | Status: DC
  Administered 2012-02-11 – 2012-02-12 (×3): via INTRAVENOUS

## 2012-02-11 NOTE — Procedures (Signed)
Successful US guided paracentesis from RLQ.  Yielded 5L of slightly turbid dark yellow fluid.  No immediate complications.  Pt tolerated well.  Her SBP did drop into the 80's from a beginning reading of 109.  Specimen was sent for labs.  Brayton El PA-C 02/11/2012 4:51 PM

## 2012-02-11 NOTE — Progress Notes (Signed)
Hypoglycemic Event  CBG: 59  Treatment: 3 glucose tabs  Symptoms: None  Follow-up CBG: Time:1742 CBG Result:113  Possible Reasons for Event: Inadequate meal intake  Comments/MD notified:Ashely RN paged Dr. Cheral Almas, Mee Hives  Remember to initiate Hypoglycemia Order Set & complete

## 2012-02-11 NOTE — Consult Note (Signed)
ONCOLOGY  HOSPITAL CONSULTATION NOTE  Ann Baldwin                                MR#: [id number]  DOB: 03-06-1946                        CSN#: [id number] Referring MD: [facility] Primary MD: Dr. Rudi Heap  Reason for Consult:    NWG:NFAOZHY Ann Baldwin is a 66 y.o. female initially found to have an ulcerative lesion of the ascending colon , s/p colonoscopy with biopsy by Dr. 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 Further immunostains are being  performed by Dr. Colonel Bald but Gyn primary is highly suspected.  CT abdomen and pelvis  with IV contrast on  02/01/2012 demonstrated a  5x6.2 cm complex solid and cystic left adnexal mass, 3.9 x 2.9 right ovarian soft tissue mass,omental infiltration and soft tissue density consistent with omental tumor/caking. Additionally, slight nodular appearance to the undersurface of the right diaphragm suspicious for  peritoneal tumor implants. Findings stranding is identified within the perihepatic ascites question adhesions, tumor strands, or septations within the ascitic fluid.  Dr. Laurette Schimke,  GYN Oncology evaluated patient on 02/09/12.  Based on the findings and elevated  CA 125 at [address]38.2, with CEA of 0.5,  3-4 cycles of neoadjuvant paclitaxel/carboplatin with interval surgery if there were clinical improvement was recommended.  She was to be seen at our clinic as well on 8/2 by Dr. Darrold Span. However, CMET performed on 7/30  revealed  acute renal insufficiency, with a Creatinine level of 8.78 then rising to 9.03 , for which she was admitted by the Hospitalist team. A new CT of the abdomen and pelvis without contrast on  7/31 confirmed again large amount of ascites, peritoneal and omental disease, consistent with carcinomatosis. No evidence for bowel obstruction. Complex bilateral ovarian masses were again seen.  We were informed of the patient's admission while her metabolic issues are being managed by the Nephrology and IM team.     PMH:  Past Medical History  Diagnosis Date  . Diabetes mellitus   . Hyperlipidemia   . Hypertension   . Ovarian cancer         Anemia  Surgeries:  Past Surgical History  Procedure Date  . No past surgeries     Allergies: No Known Allergies  Medications:   Prior to Admission:  Prescriptions prior to admission  Medication Sig Dispense Refill  . amLODipine-benazepril (LOTREL) 10-20 MG per capsule Take 1 capsule by mouth daily.      Marland Kitchen aspirin 81 MG tablet Take 81 mg by mouth daily.      Marland Kitchen atorvastatin (LIPITOR) 40 MG tablet Take 40 mg by mouth daily.      Marland Kitchen ezetimibe (ZETIA) 10 MG tablet Take 10 mg by mouth daily.      . furosemide (LASIX) 20 MG tablet Take 20 mg by mouth daily.      Marland Kitchen HYDROcodone-acetaminophen (NORCO/VICODIN) 5-325 MG per tablet Take 1 tablet by mouth every 4 (four) hours as needed. For pain      . ondansetron (ZOFRAN) 8 MG tablet Take 4-8 mg by mouth every 8 (eight) hours as needed. For nausea      . sitaGLIPtin (JANUVIA) 100 MG tablet Take 100 mg by mouth daily.      Marland Kitchen ibuprofen (ADVIL,MOTRIN) 600 MG tablet Take 600 mg by  mouth every 6 (six) hours as needed. For pain        ZOX:[id number], ondansetron, zolpidem  ROS: Constitutional: Positive for weight loss, unknown amount. Poor appetite for about 3 months.  Negative for fever, chills and malaise/fatigue.  Eyes: Negative for blurred vision and double vision.  Respiratory: Negative for cough. No hemoptysis. shortness of breath secondary to ascites. No pleuritic chest pain.  Cardiovascular: Negative for chest pain. No palpitations.  GI: Positive for nausea,no  vomiting, and positive for  Intermittent diarrhea for about 4 weeks, initially thought to be due to IBS . Initial symptoms began in 10/2011 and were accompanied by sharp LLQ pain . Abdominal tightness. Positive for change in bowel caliber, smaller diameter. No  Melena or hematochezia. GU: No blood in urine. No loss of urinary control.  Decreased urinary output for about 4 days Skin: Negative for itching. No rash. No petechia. No easy  bruising Neurological: No headaches. No motor or sensory deficits.  Family History:    No family history of cancer  Past Gynecological History: G2 P2 LNMP in 64's. Menarche 12 regular menses until menopause. No contraceptives used. No h/o abnormal pap test, Last pap 10 years ago. LAst MGM 1995.  NSVD x 2. No LMP recorded. Patient is postmenopausal.   Social History:  reports that she has never smoked. She has never used smokeless tobacco. She reports that she does not drink alcohol or use illicit drugs.  Physical Exam    Filed Vitals:   02/11/12 1005  BP: 95/59  Pulse: 83  Temp:   Resp:       General:  6 -year-old white female in no acute distress A. and O. x3  well-developed. Ill appearing.  HEENT: Normocephalic, atraumatic, PERRLA. Oral cavity without thrush or lesions. Neck supple. no thyromegaly, no cervical or supraclavicular adenopathy  Lungs decreased breath sounds at the bases. No wheezing, rhonchi or rales. No axillary masses. Breasts: not examined. Cardiac regular rate and rhythm normal S1-S2, no murmur , rubs or gallops Abdomen TTP on the LLQ. Abdomen distended,bowel sounds x4. No HSM. No masses palpable.  GU/rectal: deferred. Extremities no clubbing cyanosis or edema. No bruising or petechial rash. Poor skin turgor.  Musculoskeletal: no spinal tenderness.  Neuro: Non Focal  Labs:  CBC   Lab 02/10/12 1740  WBC 5.3  HGB 10.7*  HCT 31.8*  PLT 407*  MCV 86.4  MCH 29.1  MCHC 33.6  RDW 15.7*  LYMPHSABS --  MONOABS --  EOSABS --  BASOSABS --  BANDABS --     CMP    Lab 02/10/12 1740 02/09/12 1011  NA 135 139  K 4.3 4.4  CL 97 101  CO2 17* 18*  GLUCOSE 93 91  BUN 68* 63*  CREATININE 9.03* 8.78*  CALCIUM 9.0 8.8  MG 1.7 --  AST -- 9  ALT -- <8  ALKPHOS -- 74  BILITOT -- 0.2*        Component Value Date/Time   BILITOT 0.2* 02/09/2012 1011      No results found for this basename: INR:5,PROTIME:5 in the last 168 hours  No results found for this basename: DDIMER:2 in the last 72 hours   Anemia panel: Pending  No results found for this basename: VITAMINB12:2,FOLATE:2,FERRITIN:2,TIBC:2,IRON:2,RETICCTPCT:2 in the last 72 hours   Imaging Studies:  Ct Abdomen Pelvis Wo Contrast  02/11/2012  **ADDENDUM** CREATED: 02/11/2012 00:27:29  The patient has new renal failure. No evidence for hydronephrosis. There is a persistent nephrogram on this noncontrast exam,  a finding which can be associated with contrast nephropathy.  The patient was last given intravenous contrast on 02/01/2012.  **END ADDENDUM** SIGNED BY: Blair Hailey. Manson Passey, M.D.   02/10/2012  *RADIOLOGY REPORT*  Clinical Data: Abdominal pain.  Diarrhea, nausea.  Colonoscopy 07/16 showed a large ulceration in the ascending colon, pathology showing adenocarcinoma.  CT ABDOMEN AND PELVIS WITHOUT CONTRAST  Technique:  Multidetector CT imaging of the abdomen and pelvis was performed following the standard protocol without intravenous contrast.  Comparison: CT of the abdomen pelvis 02/01/2012  Findings: There are bilateral pleural effusions, left greater than right.  Bibasilar atelectasis/consolidation is present, increased since the prior study.  As identified previously, there is a large amount of ascites. Peritoneal nodules and omental stranding are again identified, consistent with carcinomatosis.  The liver shows no focal lesions.  No focal abnormality identified within the spleen, pancreas, or adrenal glands.  A hiatal hernia is present.  Small bowel loops show no obstruction although surrounded by mesenteric edema and ascites.  The gallbladder contains dependent, calcified gallstone.  Colonic loops are relatively collapsed.  Within the pelvis, solid and cystic adnexal masses are again noted, unchanged in appearance.  Uterine fibroids are present.  A Foley catheter decompresses the bladder.   There is diffuse body wall edema.  Degenerative changes are seen in the spine.  There is dense atherosclerotic calcification of the abdominal aorta but no aneurysm.  IMPRESSION:  1.  Large amount of ascites, peritoneal and omental disease, consistent with carcinomatosis. 2.  No evidence for bowel obstruction. 3.  Complex bilateral ovarian masses.  Differential diagnosis includes primary ovarian malignancy or ovarian metastases, which can originate from other sites such as stomach, pancreas, and colon primaries.  Original Report Authenticated By: Patterson Hammersmith, M.D.   Dg Chest 2 View  02/11/2012  *RADIOLOGY REPORT*  Clinical Data: Shortness of breath, cough, hypoxia.  CHEST - 2 VIEW  Comparison: None  Findings: Very low lung volumes with bibasilar atelectasis.  Small effusions present.  Heart size is borderline, accentuated by the low volumes.  Mild vascular congestion.  IMPRESSION: Very low lung volumes with bibasilar atelectasis and small effusions.  Original Report Authenticated By: Cyndie Chime, M.D.   Ct Abdomen Pelvis W Contrast  02/01/2012  *RADIOLOGY REPORT*  Clinical Data:  Abdominal distention, malignant right colonic ulcer, suspected ascites, lower abdominal pain, nausea, question metastatic cancer  CT ABDOMEN AND PELVIS WITH CONTRAST  Technique:  Multidetector CT imaging of the abdomen and pelvis was performed following the standard protocol during bolus administration of intravenous contrast. Sagittal and coronal MPR images reconstructed from axial data set.  Contrast: 80mL OMNIPAQUE IOHEXOL 300 MG/ML  SOLN Dilute oral contrast.  Comparison: None  Findings: Subsegmental atelectasis bilateral lower lobes greater on left. Minimal left pleural effusion. Significant ascites. Scattered atherosclerotic calcifications aorta and coronary arteries. Cholelithiasis. Liver, spleen, pancreas, kidneys, adrenal glands normal appearance.  Enhancing 1.4 x 1.6 cm diameter probable leiomyoma within the uterus.  Additional enhancing nodule adjacent to the uterine fundus question exophytic leiomyoma, 2.3 x 2.0 cm image 82. Complex solid and cystic appearing mass in left adnexa, 5.0 x 6.2 cm suspicious for a cystic ovarian neoplasm. Additional right ovarian soft tissue mass 3.9 x 2.9 cm image 80. Enlarged right inguinal lymph node 2.5 x 1.5 cm image 88. Enlarged left periaortic lymph node 2.1 x 1.6 x 3.3 cm image 46.  Omental infiltration and soft tissue density consistent with omental tumor/caking. Additionally, slight nodular appearance to the undersurface of  the right diaphragm question peritoneal tumor implants. Findings stranding is identified within the perihepatic ascites question adhesions, tumor strands, or septations within the ascitic fluid. Probable hiatal hernia. Remainder stomach decompressed. No definite colonic tumor identified. Redundant cecum in right upper quadrant. Appendix not definitely localized. No definite focal gastric or bowel abnormality otherwise identified. No definite acute osseous findings.  IMPRESSION: Bilateral ovarian masses, larger and more cystic on left, highly suspicious for ovarian neoplasms. Significant ascites with evidence of peritoneal carcinomatosis. Retroperitoneal and right inguinal adenopathy. Cholelithiasis. Hiatal hernia. No definite focal bowel lesion / mass evident by CT.  Original Report Authenticated By: Lollie Marrow, M.D.   COLONOSCOPY PROCEDURE REPORT  PROCEDURE DATE: 01/26/2012  PROCEDURE: Colonoscopy with biopsy  ASA CLASS: Class III  INDICATIONS: Routine Risk Screening  DESCRIPTION OF PROCEDUREThe LB PCF-Q180AL [id number] endoscope was introduced through the anus and advanced to the cecum, which was identified by both the appendix and ileocecal valve, without limitations. The quality of the prep was excellent, using MoviPrep. The instrument was then slowly withdrawn as the colon was fully examined.<PROCEDUREIMAGES>>  FINDINGS: An ulcer was found in the ascending  colon. Large ulcer, firm with heaped up margins. Difficult to see and obtain biopsies. Position changes and abdominal pressure utilized. This was otherwise a normal examination of the colon, though colon was redundant. Retroflexed views in the rectum revealed internal and external hemorrhoids. The time to cecum = 12:20 minutes. The scope was then withdrawn in 17 minutes from the cecum and the procedure completed.  COMPLICATIONS: None  ENDOSCOPIC IMPRESSION:  1) Ulcer in the ascending colon - biopsied  2) Internal and external hemorrhoids  3) Otherwise normal examination - excellent prep  Iva Boop, MD, Clementeen Graham  CC: Paulita Cradle, NP and The Patient n.  eSIGNED: Iva Boop at 01/26/2012 10:58 AM Clydene Fake, [id number]    A/P: 66 y.o. female with a new diagnosis of adenocarcinoma, GYN primary, with elevated CEA 125, admitted with acute renal failure and dehydration as well as diarrhea, managed by multispecialty teams. The patient was to be evaluated at our office, with plans to receive 3-4 cycles of neoadjuvant paclitaxel/carboplatin with interval surgery if there were clinical improvement. We were informed of the patient's admission, in order to proceed with recommendations while hospitalized. She is to receive therapeutic-diagnostic thoracentesis today. Spoke with Warner Mccreedy, NP for GYN Oncology who informed as that the patient is to be seen by Dr.Quang Thorpe at the [facility] once discharged from the hospital after her renal functions have normalized. At this time, her preliminary ointment time is 02/18/12 after her  visit with Dr. Nelly Rout. Thank you very much for allowing [place] the opportunity to participate in the care of this nice patient.   Marlowe Kays E 02/11/2012 2:22 PM  See full consult note 02-12-12 Jama Flavors, MD

## 2012-02-11 NOTE — Progress Notes (Signed)
I have personally seen and examined this patient and agree with the assessment/plan as outlined above by Lorretta Harp MD (PGY 2 Resident). Ann Baldwin is a 66 year old Caucasian woman with past medical history significant for diabetes-unclear duration, hypertension and dyslipidemia. In her work up for chronic nausea vomiting and abdominal pain, she underwent a colonoscopy that showed colonic ulcer with biopsy features consistent of adenocarcinoma which by CT scan with contrast on 02/01/2012 was found to be from metastatic left-sided ovarian cancer. She was admitted with acute renal failure (creatinine at baseline 1.5) with a creatinine of 9.0. Urine output charted overnight is noted to be in the anuric range at 30 cc.  Prior to her admission, she was regularly taking ACE inhibitors, diuretics and nonsteroidal anti-inflammatory drugs. On exam she appears to be hypotensive and volume depleted with significant discomfort from ascites/abdominal distention.  Her acute renal failure appears to be multifactorial with the predominating etiology of volume depletion/prerenal azotemia in the face of ongoing ACE inhibitor and nonsteroidal anti-inflammatory drug use. Cannot rule out evolution into acute tubular necrosis (ischemic) without a fair trial of intravenous fluids for volume resuscitation. We will look for the presence of any ureteral/urinary tract obstruction when renal ultrasound results are available. Given her abdominal distention/discomfort-  Questioning as to whether intra-abdominal hypertension is a contributory factor to the etiology of her acute renal failure.  At this time, she has no acute hemodialysis needs. Await further input from oncology.  Ann Oyola K.,MD 02/11/2012 3:42 PM

## 2012-02-11 NOTE — Progress Notes (Signed)
INITIAL ADULT NUTRITION ASSESSMENT Date: 02/11/2012   Time: 10:14 AM  Reason for Assessment: Nutrition Risk Report  ASSESSMENT: Female 66 y.o.  Dx: ARF  Hx:  Past Medical History  Diagnosis Date  . Diabetes mellitus   . Hyperlipidemia   . Hypertension   . Ovarian cancer    Past Surgical History  Procedure Date  . No past surgeries    Related Meds:     . atorvastatin  40 mg Oral Daily  . enoxaparin (LOVENOX) injection  30 mg Subcutaneous Q24H  . ezetimibe  10 mg Oral Daily  . insulin aspart  0-9 Units Subcutaneous TID WC  . sodium chloride  1,000 mL Intravenous Once  . sodium chloride  1,000 mL Intravenous Once  . sodium chloride  3 mL Intravenous Q12H   Ht: 5' 3.89" (162.3 cm)  Wt: 169 lb 15.6 oz (77.1 kg)  Ideal Wt: 54.5 kg % Ideal Wt: 141%  Wt Readings from Last 15 Encounters:  02/10/12 169 lb 15.6 oz (77.1 kg)  02/09/12 168 lb 8 oz (76.431 kg)  01/26/12 169 lb (76.658 kg)  01/12/12 169 lb 12.8 oz (77.021 kg)  Usual Wt: unable to state % Usual Wt: n/a  Body mass index is 29.28 kg/(m^2). Pt is overweight.  Food/Nutrition Related Hx: poor intake x 3 months  Labs:  CMP     Component Value Date/Time   NA 135 02/10/2012 1740   K 4.3 02/10/2012 1740   CL 97 02/10/2012 1740   CO2 17* 02/10/2012 1740   GLUCOSE 93 02/10/2012 1740   BUN 68* 02/10/2012 1740   CREATININE 9.03* 02/10/2012 1740   CALCIUM 9.0 02/10/2012 1740   PROT 6.5 02/09/2012 1011   ALBUMIN 3.4* 02/09/2012 1011   AST 9 02/09/2012 1011   ALT <8 02/09/2012 1011   ALKPHOS 74 02/09/2012 1011   BILITOT 0.2* 02/09/2012 1011   GFRNONAA 4* 02/10/2012 1740   GFRAA 5* 02/10/2012 1740   Phosphorus  Date/Time Value Range Status  02/10/2012  5:40 PM 7.5* 2.3 - 4.6 mg/dL Final   Potassium  Date/Time Value Range Status  02/10/2012  5:40 PM 4.3  3.5 - 5.1 mEq/L Final  02/09/2012 10:11 AM 4.4  3.5 - 5.3 mEq/L Final     Intake/Output Summary (Last 24 hours) at 02/11/12 1016 Last data filed at 02/11/12 1610  Gross per 24 hour  Intake 718.75 ml  Output     30 ml  Net 688.75 ml    Diet Order: NPO  Supplements/Tube Feeding: none  IVF:    sodium chloride Last Rate: 75 mL/hr at 02/11/12 0855    Estimated Nutritional Needs:   Kcal: 1925 - 2100 kcal Protein: 70 - 85 grams protein Fluid: at least 2 liters daily  Pt with new dx of ovarian mass, likely ovarian CA, has ARF. Pt is currently NPO, states that she has not received a tray since admission, unable to assess current PO intake.  Pt states that her intake has declined for 3 months. States that she would just eat bites of foods at meal times. Stated that her family recently purchased her Special K shakes and protein bars PTA, but she was admitted before she was able to try them. Agreeable to trying supplement while here.  Pt unable to state her usual weight or how much weight she has lost. Says that her family has been commenting that her temples and face appear thinner.  Pt is at nutrition risk given recent decline in PO intake,  recent wt loss, and likely catabolic illness.  NUTRITION DIAGNOSIS: -Inadequate oral intake (NI-2.1).  Status: Ongoing  RELATED TO: inability to eat  AS EVIDENCE BY: NPO status  MONITORING/EVALUATION(Goals): Goal: Pt to meet >/= 90% of their estimated nutrition needs Monitor: weights, labs, diet advancement  EDUCATION NEEDS: -Education not appropriate at this time  INTERVENTION: 1. Recommend liberalized, Regular diet 2. Once diet order allows, recommend Glucerna Shake to help meet nutrition needs 3. RD to continue to follow nutrition care plan   DOCUMENTATION CODES Per approved criteria  -Not Applicable   Jarold Motto MS, RD, LDN Pager: [phone] After-hours pager: [phone]

## 2012-02-11 NOTE — Progress Notes (Signed)
Spoke with Marlowe Kays, PA and Dr. Welton Flakes, Oncologist on call.  Informed of the plan to have the patient receive inpatient treatment to normalize renal function and follow up with Dr. Nelly Rout in GYN Oncology on February 18, 2012.  Dr. Jama Flavors to see the patient as a new consult following the August 8th appointment as previously planned.

## 2012-02-11 NOTE — Progress Notes (Signed)
Utilization review completed.  

## 2012-02-11 NOTE — Progress Notes (Signed)
Received patient from [place] ED,via [facility].Admitted with dx ARF.Patient is alert and oriented x4.Foley catheter in place.Abdomen distended, ascites.Informed patient not to eat or drink anything after MN for possible paracentesis per MD note.Placed patient on telemetry.Oriented to room and unit routines.Call bell within reach.Family at bedside. Jenise Iannelli Joselita,RN

## 2012-02-11 NOTE — Progress Notes (Signed)
Patient ID: Ann Baldwin  female  UJW:[id number]    DOB: 12-26-1945    DOA: 02/10/2012  PCP: Allie Dimmer, OTR  Subjective: Patient seen and examined earlier this morning, awaiting further testing. Abdominal Pain controlled. Denies any nausea, vomiting any bleeding, chest pain.  Objective: Weight change:   Intake/Output Summary (Last 24 hours) at 02/11/12 1335 Last data filed at 02/11/12 0828  Gross per 24 hour  Intake 718.75 ml  Output     30 ml  Net 688.75 ml   Blood pressure 95/59, pulse 83, temperature 98.5 F (36.9 C), temperature source Oral, resp. rate 17, height 5' 3.89" (1.623 m), weight 77.1 kg (169 lb 15.6 oz), SpO2 93.00%.  Physical Exam: General: Alert and awake, oriented x3, not in any acute distress. HEENT: anicteric sclera, pupils reactive to light and accommodation, EOMI CVS: S1-S2 clear, no murmur rubs or gallops Chest: Decreased breath sound at the bases. Abdomen: Ascites with distention, tenderness to palpation in the left lower quadrant Extremities: no cyanosis, clubbing or edema noted bilaterally Neuro: Cranial nerves II-XII intact, no focal neurological deficits  Lab Results: Basic Metabolic Panel:  Lab 02/10/12 5621 02/09/12 1011  NA 135 139  K 4.3 4.4  CL 97 101  CO2 17* 18*  GLUCOSE 93 91  BUN 68* 63*  CREATININE 9.03* 8.78*  CALCIUM 9.0 8.8  MG 1.7 --  PHOS 7.5* --   Liver Function Tests:  Lab 02/09/12 1011  AST 9  ALT <8  ALKPHOS 74  BILITOT 0.2*  PROT 6.5  ALBUMIN 3.4*   CBC:  Lab 02/10/12 1740  WBC 5.3  NEUTROABS --  HGB 10.7*  HCT 31.8*  MCV 86.4  PLT 407*   CBG:  Lab 02/11/12 1225 02/11/12 0725 02/10/12 2322  GLUCAP 78 89 78     Micro Results: No results found for this or any previous visit (from the past 240 hour(s)).  Studies/Results: Ct Abdomen Pelvis Wo Contrast  02/11/2012  **ADDENDUM** CREATED: 02/11/2012 00:27:29  The patient has new renal failure. No evidence for hydronephrosis. There is a  persistent nephrogram on this noncontrast exam, a finding which can be associated with contrast nephropathy.  The patient was last given intravenous contrast on 02/01/2012.  **END ADDENDUM** SIGNED BY: Blair Hailey. Manson Passey, M.D.   02/10/2012  *RADIOLOGY REPORT*  Clinical Data: Abdominal pain.  Diarrhea, nausea.  Colonoscopy 07/16 showed a large ulceration in the ascending colon, pathology showing adenocarcinoma.  CT ABDOMEN AND PELVIS WITHOUT CONTRAST  Technique:  Multidetector CT imaging of the abdomen and pelvis was performed following the standard protocol without intravenous contrast.  Comparison: CT of the abdomen pelvis 02/01/2012  Findings: There are bilateral pleural effusions, left greater than right.  Bibasilar atelectasis/consolidation is present, increased since the prior study.  As identified previously, there is a large amount of ascites. Peritoneal nodules and omental stranding are again identified, consistent with carcinomatosis.  The liver shows no focal lesions.  No focal abnormality identified within the spleen, pancreas, or adrenal glands.  A hiatal hernia is present.  Small bowel loops show no obstruction although surrounded by mesenteric edema and ascites.  The gallbladder contains dependent, calcified gallstone.  Colonic loops are relatively collapsed.  Within the pelvis, solid and cystic adnexal masses are again noted, unchanged in appearance.  Uterine fibroids are present.  A Foley catheter decompresses the bladder.  There is diffuse body wall edema.  Degenerative changes are seen in the spine.  There is dense atherosclerotic calcification of the  abdominal aorta but no aneurysm.  IMPRESSION:  1.  Large amount of ascites, peritoneal and omental disease, consistent with carcinomatosis. 2.  No evidence for bowel obstruction. 3.  Complex bilateral ovarian masses.  Differential diagnosis includes primary ovarian malignancy or ovarian metastases, which can originate from other sites such as  stomach, pancreas, and colon primaries.  Original Report Authenticated By: Patterson Hammersmith, M.D.   Dg Chest 2 View  02/11/2012  *RADIOLOGY REPORT*  Clinical Data: Shortness of breath, cough, hypoxia.  CHEST - 2 VIEW  Comparison: None  Findings: Very low lung volumes with bibasilar atelectasis.  Small effusions present.  Heart size is borderline, accentuated by the low volumes.  Mild vascular congestion.  IMPRESSION: Very low lung volumes with bibasilar atelectasis and small effusions.  Original Report Authenticated By: Cyndie Chime, M.D.   Ct Abdomen Pelvis W Contrast  02/01/2012  *RADIOLOGY REPORT*  Clinical Data:  Abdominal distention, malignant right colonic ulcer, suspected ascites, lower abdominal pain, nausea, question metastatic cancer  CT ABDOMEN AND PELVIS WITH CONTRAST  Technique:  Multidetector CT imaging of the abdomen and pelvis was performed following the standard protocol during bolus administration of intravenous contrast. Sagittal and coronal MPR images reconstructed from axial data set.  Contrast: 80mL OMNIPAQUE IOHEXOL 300 MG/ML  SOLN Dilute oral contrast.  Comparison: None  Findings: Subsegmental atelectasis bilateral lower lobes greater on left. Minimal left pleural effusion. Significant ascites. Scattered atherosclerotic calcifications aorta and coronary arteries. Cholelithiasis. Liver, spleen, pancreas, kidneys, adrenal glands normal appearance.  Enhancing 1.4 x 1.6 cm diameter probable leiomyoma within the uterus. Additional enhancing nodule adjacent to the uterine fundus question exophytic leiomyoma, 2.3 x 2.0 cm image 82. Complex solid and cystic appearing mass in left adnexa, 5.0 x 6.2 cm suspicious for a cystic ovarian neoplasm. Additional right ovarian soft tissue mass 3.9 x 2.9 cm image 80. Enlarged right inguinal lymph node 2.5 x 1.5 cm image 88. Enlarged left periaortic lymph node 2.1 x 1.6 x 3.3 cm image 46.  Omental infiltration and soft tissue density consistent with  omental tumor/caking. Additionally, slight nodular appearance to the undersurface of the right diaphragm question peritoneal tumor implants. Findings stranding is identified within the perihepatic ascites question adhesions, tumor strands, or septations within the ascitic fluid. Probable hiatal hernia. Remainder stomach decompressed. No definite colonic tumor identified. Redundant cecum in right upper quadrant. Appendix not definitely localized. No definite focal gastric or bowel abnormality otherwise identified. No definite acute osseous findings.  IMPRESSION: Bilateral ovarian masses, larger and more cystic on left, highly suspicious for ovarian neoplasms. Significant ascites with evidence of peritoneal carcinomatosis. Retroperitoneal and right inguinal adenopathy. Cholelithiasis. Hiatal hernia. No definite focal bowel lesion / mass evident by CT.  Original Report Authenticated By: Lollie Marrow, M.D.    Medications: Scheduled Meds:   . atorvastatin  40 mg Oral Daily  . enoxaparin (LOVENOX) injection  30 mg Subcutaneous Q24H  . ezetimibe  10 mg Oral Daily  . insulin aspart  0-9 Units Subcutaneous TID WC  . sodium chloride  1,000 mL Intravenous Once  . sodium chloride  1,000 mL Intravenous Once  . sodium chloride  3 mL Intravenous Q12H   Continuous Infusions:   . 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 Warner Mccreedy, appreciated Gyn-Onc recommendations and follow-up, plan either surgery or neoadjuvant chemotherapy with subsequent surgery once her renal function has normalized, followup with Dr. Nelly Rout on August 8th. - Called for  oncology consult, patient of Dr. Darrold Span.   Diabetes mellitus: - Start on sliding scale insulin once placed on diet   Ascites: - Likely malignant due to newly diagnosed metastatic ovarian CA, pericardial carcinomatosis - Obtain paracentesis with the labs ordered   Hypertension currently hypotensive: - Hold off on any antihypertensives, continue gentle hydration   Hyperlipidemia: Continue Zetia and atorvastatin  DVT Prophylaxis: Lovenox  Code Status:  Disposition: Not medically ready   LOS: 1 day   RAI,RIPUDEEP M.D. [facility] 02/11/2012, 1:35 PM Pager: [phone]  If 7PM-7AM, please contact night-coverage [email or web address] Password [email or web address]

## 2012-02-11 NOTE — Telephone Encounter (Signed)
Talked to Dagmar Hait, pt is in hospital and appt left on Dr. Darrold Span schedule per Harriett Sine

## 2012-02-11 NOTE — Progress Notes (Addendum)
Consult Note: Gyn-Onc  Ann Baldwin 66 y.o. female  CC:  Abdominal pain, ascites.  Presented to the Emergency Room due to creatinine level of 8.78.  HPI:  66 y/o G2P2 presented in April 2013 with abdominal discomfort, diarrhea, and nausea that were thought to be related to IBD.  A colonoscopy was performed on 01/26/2012 due to the symptoms persisting. Findings were notable for a large ulcer with heaped up margins in the ascending colon. Although difficult to see, biopsies were obtained. Pathology was consistent with adenocarcinoma with the concern that it was not a primary colonic adenocarcinoma.  CT of the abdomen and pelvis on 02/01/2012 showed a 5x6.2 cm complex solid and cystic left adnexal mass, 3.9x2.9 right ovarian soft tissue mass, omental caking, and peritoneal carcinomatosis.  During the initial consult with Dr. Nelly Rout on February 09, 2012, the patient denied rectal bleeding, vaginal bleeding, or weight loss.  She reported a poor appetite, early satiety, and abdominal distention for two months.  Plans were made for the patient to see Dr. Darrold Span on Friday, February 12, 2012 to evaluate for chemotherapy, which was cancelled due to ED admission.  Lab work was obtained during the initial visit on 02/09/12 with abnormal finding of CA 125 level at 22,938.2.  On 02/10/12, a Cmet was added on to the blood drawn on 02/09/12 with findings of BUN 63 and serum creatinine of 8.78.  The patient was advised to go to the Emergency Room to evaluate and treat renal function by Dr. Nelly Rout on 02/10/12.      Review of Systems: Constitutional:  Feels poorly.  Tired.    Cardiovascular:  No chest pain. Shortness of breath intermittently due to ascites.  Pulmonary:  No cough or sputum production at this time. No wheeze.  Gastrointestinal:  Reports nausea intermittently but not at this time.  Diarrhea.  No bright red blood per rectum. Reports abdominal tightness. Pt reports sharp pain in the left lower quadrant  intermittently that is relieved with medication.  Genitourinary:  No frequency, urgency, dysuria, no vaginal bleeding or discharge  Musculoskeletal:  No myalgia, arthralgia, joint swelling or pain  Neurologic:  No weakness, numbness, change in gait,  Psychology:  No depression, anxiety,difficulty sleeping.  Allergy: No Known Allergies  Social Hx:   History   Social History  . Marital Status: Single    Spouse Name: N/A    Number of Children: N/A  . Years of Education: N/A   Occupational History  . [employer]    Social History Main Topics  . Smoking status: Never Smoker   . Smokeless tobacco: Never Used  . Alcohol Use: No  . Drug Use: No  . Sexually Active: No   Other Topics Concern  . Not on file   Social History Narrative  . No narrative on file    Past Surgical Hx:  Past Surgical History  Procedure Date  . No past surgeries     Past Medical Hx:  Past Medical History  Diagnosis Date  . Diabetes mellitus   . Hyperlipidemia   . Hypertension   . Ovarian cancer     Family Hx:  Family History  Problem Relation Age of Onset  . Colon cancer Neg Hx   . Esophageal cancer Neg Hx   . Stomach cancer Neg Hx   . Rectal cancer Neg Hx    Vital signs in last 24 hours: Temp:  [98 F (36.7 C)-98.5 F (36.9 C)] 98.5 F (36.9 C) (08/01 0844) Pulse  Rate:  [81-101] 83  (08/01 1005) Resp:  [10-17] 17  (08/01 0844) BP: (89-123)/(47-64) 95/59 mmHg (08/01 1005) SpO2:  [90 %-93 %] 93 % (08/01 0844) Weight:  [168 lb 8 oz (76.431 kg)-169 lb 15.6 oz (77.1 kg)] 169 lb 15.6 oz (77.1 kg) (07/31 2307) Last BM Date: 02/10/12  Intake/Output from previous day: 07/31 0701 - 08/01 0700 In: 718.8 [P.O.:120; I.V.:598.8] Out: 30 [Urine:30]  Labs: WBC/Hgb/Hct/Plts:  5.3/10.7/31.8/407 (07/31 1740) BUN/Cr/glu/ALT/AST/amyl/lip:  68/9.03/--/--/--/--/-- (07/31 1740)  CT Scan 02/10/12:1. Large amount of ascites, peritoneal and omental disease, consistent with carcinomatosis. 2. No  evidence for bowel obstruction. 3. Complex bilateral ovarian masses. Differential diagnosis includes primary ovarian malignancy or ovarian metastases, which can originate from other sites such as stomach, pancreas, and colon primaries.   Physical Exam: General: alert, cooperative, no distress and pale Resp: clear to auscultation bilaterally Cardio: regular rate and rhythm, S1, S2 normal, no murmur, click, rub or gallop GI: abnormal findings:  ascites, distended and active bowel sounds noted.  Tenderness reported with palpation of the left lower quadrant Extremities: extremities normal, atraumatic, no cyanosis or edema GU: Foley catheter to straight drain with trace amounts of urine in the drainage bag  Assessment/Plan:  Plan is to normalize renal function then continue with plans for either surgery performed by GYN Oncology vs. most likely neoadjuvant chemotherapy with subsequent surgery.  Plans to have patient see Dr. Nelly Rout on February 18, 2012 in clinic to reconsult.  Dr. Darrold Span is aware of the following patient.  Appointment with her was cancelled for February 12, 2012 until renal failure corrected.  Plan to reschedule appointment with Dr. Darrold Span after 02/18/12 visit with Dr. Nelly Rout.  Dr. Nelly Rout can be reached at [phone].     CROSS, MELISSA DEAL 02/11/2012, 11:04 AM

## 2012-02-11 NOTE — Progress Notes (Signed)
Reason for Consult: acute renal failure Referring Physician:  Dr. Rosalia Hammers   HPI:    66 yo female with PMH of DM2, hypertension and hyperlipidemia, who has chronic nausea, vomiting and abdominal pain since April of 2013. She was initially treated as IBD without improvement. Colonoscopy on 01/26/12 showed a large ulcer in ascending colon. Biopsy of the ulcer showed adenocarcinoma. CT scan with contrast  on 7/22 showed 5 X 6.2 cm complex left adnexal mass. CA-125 is elevated CA-125 (16109) with CEA of 0.5.   Patient presents to ED  due to worsening nausea, vomiting and abdominal pain on 02/10/12. She was found to have creatinine 9.03. Of note, her cre was 1.48 on 01/18/2012 per notes in computer. Patient has been taking motrin up until 2-3 days ago. She has been also on lasix 20 mg daily.   Pt denies fever, chills, chest pain, palpitation, sob, orthopnea, lower ext edema.    Past Medical History  Diagnosis Date  . Diabetes mellitus   . Hyperlipidemia   . Hypertension   . Ovarian cancer     Allergies: No Known Allergies  Medications:   Prior to Admission medications   Medication Sig Start Date End Date Taking? Authorizing Provider  amLODipine-benazepril (LOTREL) 10-20 MG per capsule Take 1 capsule by mouth daily.   Yes Historical Provider, MD  aspirin 81 MG tablet Take 81 mg by mouth daily.   Yes Historical Provider, MD  atorvastatin (LIPITOR) 40 MG tablet Take 40 mg by mouth daily.   Yes Historical Provider, MD  ezetimibe (ZETIA) 10 MG tablet Take 10 mg by mouth daily.   Yes Historical Provider, MD  furosemide (LASIX) 20 MG tablet Take 20 mg by mouth daily.   Yes Historical Provider, MD  HYDROcodone-acetaminophen (NORCO/VICODIN) 5-325 MG per tablet Take 1 tablet by mouth every 4 (four) hours as needed. For pain 02/09/12 02/19/12 Yes Melissa D Cross, NP  ondansetron (ZOFRAN) 8 MG tablet Take 4-8 mg by mouth every 8 (eight) hours as needed. For nausea 02/09/12 02/16/12 Yes Melissa D Cross, NP    sitaGLIPtin (JANUVIA) 100 MG tablet Take 100 mg by mouth daily.   Yes Historical Provider, MD  ibuprofen (ADVIL,MOTRIN) 600 MG tablet Take 600 mg by mouth every 6 (six) hours as needed. For pain    Historical Provider, MD    Discontinued Meds:   Medications Discontinued During This Encounter  Medication Reason  . lubiprostone (AMITIZA) 8 MCG capsule Change in therapy  . ondansetron (ZOFRAN) 8 MG tablet   . HYDROcodone-acetaminophen (NORCO/VICODIN) 5-325 MG per tablet     Family History:   Family History  Problem Relation Age of Onset  . Colon cancer Neg Hx   . Esophageal cancer Neg Hx   . Stomach cancer Neg Hx   . Rectal cancer Neg Hx      Lab 02/10/12 1740 02/09/12 1011  CREATININE 9.03* 8.78*    Lab 02/10/12 1740 02/09/12 1011  NA 135 139  K 4.3 4.4  CL 97 101  CO2 17* 18*  GLUCOSE 93 91  BUN 68* 63*  CREATININE 9.03* 8.78*  CALCIUM 9.0 8.8  MG 1.7 --  PHOS 7.5* --   CBC:    Component Value Date/Time   WBC 5.3 02/10/2012 1740   HGB 10.7* 02/10/2012 1740   HCT 31.8* 02/10/2012 1740   PLT 407* 02/10/2012 1740   MCV 86.4 02/10/2012 1740    Lab 02/10/12 1740  HGB 10.7*   Magnesium: 1.7 Phosphorus: 7.5  URINALYSIS, ROUTINE W REFLEX MICROSCOPIC     Status: Abnormal      02/10/12  6:36 PM      Component Value Range Comment   Color, Urine AMBER (*) YELLOW BIOCHEMICALS MAY BE AFFECTED BY COLOR   APPearance CLOUDY (*) CLEAR    Specific Gravity, Urine 1.032 (*) 1.005 - 1.030    pH 5.0  5.0 - 8.0    Glucose, UA NEGATIVE  NEGATIVE mg/dL    Hgb urine dipstick TRACE (*) NEGATIVE    Bilirubin Urine SMALL (*) NEGATIVE    Ketones, ur TRACE (*) NEGATIVE mg/dL    Protein, ur NEGATIVE  NEGATIVE mg/dL    Urobilinogen, UA 0.2  0.0 - 1.0 mg/dL    Nitrite NEGATIVE  NEGATIVE    Leukocytes, UA NEGATIVE  NEGATIVE   URINE MICROSCOPIC-ADD ON     Status: Normal   Collection Time   02/10/12  6:36 PM      Component Value Range Comment   RBC / HPF 0-2  <3 RBC/hpf     Urine-Other AMORPHOUS URATES/PHOSPHATES     GLUCOSE, CAPILLARY     Status: Normal   Collection Time   02/10/12 11:22 PM      Component Value Range Comment   Glucose-Capillary 78  70 - 99 mg/dL   SODIUM, URINE, RANDOM     Status: Normal   Collection Time   02/11/12  5:38 AM      Component Value Range Comment   Sodium, Ur 39     GLUCOSE, CAPILLARY     Status: Normal   Collection Time   02/11/12  7:25 AM      Component Value Range Comment   Glucose-Capillary 89  70 - 99 mg/dL    Physical Exam:   Vitals: T: 98.5     HR:  83     BP: 95/59      RR:17    O2 saturation:  93  General: resting in bed, not in acute distress HEENT: PERRL, EOMI, no scleral icterus Cardiac: S1/S2, RRR, No murmurs, gallops or rubs Pulm: Decreased air movement at base bilaterally, worse on the right side. No rales, wheezing, rhonchi or rubs. Abd: Distended, mild tender over LLQ, no rebound pain, no organomegaly, BS present Ext: No rashes. No edema, 2+DP/PT pulse bilaterally Musculoskeletal: No joint deformities, erythema, or stiffness, ROM full and nontender Skin: no rashes. No skin bruise. Neuro: alert and oriented X3, cranial nerves II-XII grossly intact, muscle strength 5/5 in all extremeties,  sensation to light touch intact.   Ct Abdomen Pelvis Wo Contrast  02/11/2012  **ADDENDUM** CREATED: 02/11/2012 00:27:29  The patient has new renal failure. No evidence for hydronephrosis. There is a persistent nephrogram on this noncontrast exam, a finding which can be associated with contrast nephropathy.  The patient was last given intravenous contrast on 02/01/2012.  **END ADDENDUM** SIGNED BY: Blair Hailey. Manson Passey, M.D.   02/10/2012  *RADIOLOGY REPORT*  Clinical Data: Abdominal pain.  Diarrhea, nausea.  Colonoscopy 07/16 showed a large ulceration in the ascending colon, pathology showing adenocarcinoma.  CT ABDOMEN AND PELVIS WITHOUT CONTRAST  Technique:  Multidetector CT imaging of the abdomen and pelvis was performed following  the standard protocol without intravenous contrast.  Comparison: CT of the abdomen pelvis 02/01/2012  Findings: There are bilateral pleural effusions, left greater than right.  Bibasilar atelectasis/consolidation is present, increased since the prior study.  As identified previously, there is a large amount of ascites. Peritoneal nodules and omental stranding are again  identified, consistent with carcinomatosis.  The liver shows no focal lesions.  No focal abnormality identified within the spleen, pancreas, or adrenal glands.  A hiatal hernia is present.  Small bowel loops show no obstruction although surrounded by mesenteric edema and ascites.  The gallbladder contains dependent, calcified gallstone.  Colonic loops are relatively collapsed.  Within the pelvis, solid and cystic adnexal masses are again noted, unchanged in appearance.  Uterine fibroids are present.  A Foley catheter decompresses the bladder.  There is diffuse body wall edema.  Degenerative changes are seen in the spine.  There is dense atherosclerotic calcification of the abdominal aorta but no aneurysm.  IMPRESSION:  1.  Large amount of ascites, peritoneal and omental disease, consistent with carcinomatosis. 2.  No evidence for bowel obstruction. 3.  Complex bilateral ovarian masses.  Differential diagnosis includes primary ovarian malignancy or ovarian metastases, which can originate from other sites such as stomach, pancreas, and colon primaries.  Original Report Authenticated By: Patterson Hammersmith, M.D.   Assessment/Plan:  #: ARF: It is most likely multifactorial, including prerenal secondary to volume depletion due to nausea/vomiting/diarrhea, NSIADs, ACEI and IV contrast, HTN and DM-2. Currently patient's Cre was 9.0, and BUN was 68 on 02/10/12. Patient dose not seem to be fluid overloaded. Her lung is clear at middle and upper lobes. Her chest x-ray showed very low lung volumes with bibasilar atelectasis and small effusions, which is most  likely due to abdominal distension secondary to acinitis. Patient does not have leg edema. Patient's Na and K are normal. Patient's N/V/abdominal pain could be due to uremic syndrome, but it can also be due to ascitis and abdominal tumors.   - Will check FeUrea: patient was on lasix, FeNa may not be useful - Will treat with IV: NS 75 cc/h - Pending urine Eosinophils - d/c Lasix, Amlodipine, Benazepril, ibuprofen - Renal ultrasound to r/o hydronephrosis  #: Ovarian mass: CA 125 elevated. Oncology was consulted.  #: Ascites: Paracentesis was ordered.  #: DM-2: On SSI and d/c Januvia  #: HTN: blood pressure is running low currently. D/c blood pressure medicaions.  #: Borderline hypoxemia: it is most likely due to deceased air movement secondary to severe ascitics. Since patient has possible malignancy which increased the possibility of PE. VQ scan was ordered for ruling out PE.   #: Hyperphosphoremia:  will treat with aluminum hydroxide 30 ml, tid, po for 72 hours  #: Anemia: Hgb was 10.7 on 02/10/12. It is likely due to possible malignant ovarian cancer and blood lose from colon given her large ulcer in colon. Will check anemia panel.    Lorretta Harp, MD PGY2, Internal Medicine Teaching Service Pager: [phone]

## 2012-02-12 ENCOUNTER — Ambulatory Visit: Payer: Medicare Other | Admitting: Oncology

## 2012-02-12 ENCOUNTER — Ambulatory Visit: Payer: Medicare Other

## 2012-02-12 ENCOUNTER — Other Ambulatory Visit: Payer: Medicare Other | Admitting: Lab

## 2012-02-12 DIAGNOSIS — R188 Other ascites: Secondary | ICD-10-CM

## 2012-02-12 DIAGNOSIS — D649 Anemia, unspecified: Secondary | ICD-10-CM

## 2012-02-12 LAB — GLUCOSE, CAPILLARY: Glucose-Capillary: 132 mg/dL — ABNORMAL HIGH (ref 70–99)

## 2012-02-12 LAB — RENAL FUNCTION PANEL
CO2: 18 mEq/L — ABNORMAL LOW (ref 19–32)
Chloride: 102 mEq/L (ref 96–112)
GFR calc Af Amer: 5 mL/min — ABNORMAL LOW (ref >90)
GFR calc non Af Amer: 4 mL/min — ABNORMAL LOW (ref >90)
Glucose, Bld: 121 mg/dL — ABNORMAL HIGH (ref 70–99)
Sodium: 135 mEq/L (ref 135–145)

## 2012-02-12 LAB — UREA NITROGEN, URINE: Urea Nitrogen, Ur: 201 mg/dL

## 2012-02-12 LAB — IRON AND TIBC: UIBC: 150 ug/dL (ref 125–400)

## 2012-02-12 LAB — FERRITIN: Ferritin: 105 ng/mL (ref 10–291)

## 2012-02-12 LAB — FOLATE: Folate: 9.8 ng/mL

## 2012-02-12 MED ORDER — CALCIUM ACETATE 667 MG PO CAPS
1334.0000 mg | ORAL_CAPSULE | Freq: Three times a day (TID) | ORAL | Status: DC
  Administered 2012-02-12 – 2012-02-14 (×7): 1334 mg via ORAL
  Filled 2012-02-12 (×11): qty 2

## 2012-02-12 NOTE — Progress Notes (Signed)
Patient ID: Ann Baldwin  female  HYQ:[id number]    DOB: Mar 08, 1946    DOA: 02/10/2012  PCP: Allie Dimmer, OTR  Subjective: Patient seen and examined earlier this morning, feels a lot better after paracentesis done. Having some nausea issues this morning otherwise no vomiting, abdominal pain controlled, no chest pain, shortness of breath or any fevers. Despite nausea patient still want to continue solid diet.  Objective: Weight change: 0.669 kg (1 lb 7.6 oz)  Intake/Output Summary (Last 24 hours) at 02/12/12 1311 Last data filed at 02/12/12 0900  Gross per 24 hour  Intake 2614.17 ml  Output    225 ml  Net 2389.17 ml   Blood pressure 85/47, pulse 86, temperature 98.1 F (36.7 C), temperature source Oral, resp. rate 16, height 5' 3.89" (1.623 m), weight 77.1 kg (169 lb 15.6 oz), SpO2 91.00%.  Physical Exam: General: Alert and awake, oriented x3, not in any acute distress. HEENT: anicteric sclera, pupils reactive to light and accommodation, EOMI CVS: S1-S2 clear, no murmur rubs or gallops Chest: Decreased breath sound at the bases. Abdomen: Soft, distention improved from yesterday, tenderness to palpation in the left lower quadrant Extremities: no cyanosis, clubbing or edema noted bilaterally Neuro: Cranial nerves II-XII intact, no focal neurological deficits  Lab Results: Basic Metabolic Panel:  Lab 02/12/12 9528 02/10/12 1740  NA 135 135  K 4.7 4.3  CL 102 97  CO2 18* 17*  GLUCOSE 121* 93  BUN 68* 68*  CREATININE 8.21* 9.03*  CALCIUM 7.5* 9.0  MG -- 1.7  PHOS 6.8* --   Liver Function Tests:  Lab 02/12/12 0816 02/09/12 1011  AST -- 9  ALT -- <8  ALKPHOS -- 74  BILITOT -- 0.2*  PROT -- 6.5  ALBUMIN 2.1* 3.4*   CBC:  Lab 02/10/12 1740  WBC 5.3  NEUTROABS --  HGB 10.7*  HCT 31.8*  MCV 86.4  PLT 407*   CBG:  Lab 02/12/12 1132 02/12/12 0730 02/11/12 2115 02/11/12 1742 02/11/12 1656  GLUCAP 142* 132* 122* 113* 59*     Micro Results: Recent  Results (from the past 240 hour(s))  BODY FLUID CULTURE     Status: Normal (Preliminary result)   Collection Time   02/11/12  3:43 PM      Component Value Range Status Comment   Specimen Description FLUID ASCITIC   Final    Special Requests   Final    Gram Stain     Final    Value: RARE WBC PRESENT,BOTH PMN AND MONONUCLEAR     NO ORGANISMS SEEN   Culture NO GROWTH   Final    Report Status PENDING   Incomplete     Studies/Results: Ct Abdomen Pelvis Wo Contrast  02/11/2012  **ADDENDUM** CREATED: 02/11/2012 00:27:29  The patient has new renal failure. No evidence for hydronephrosis. There is a persistent nephrogram on this noncontrast exam, a finding which can be associated with contrast nephropathy.  The patient was last given intravenous contrast on 02/01/2012.  **END ADDENDUM** SIGNED BY: Blair Hailey. Manson Passey, M.D.   02/10/2012  *RADIOLOGY REPORT*  Clinical Data: Abdominal pain.  Diarrhea, nausea.  Colonoscopy 07/16 showed a large ulceration in the ascending colon, pathology showing adenocarcinoma.  CT ABDOMEN AND PELVIS WITHOUT CONTRAST  Technique:  Multidetector CT imaging of the abdomen and pelvis was performed following the standard protocol without intravenous contrast.  Comparison: CT of the abdomen pelvis 02/01/2012  Findings: There are bilateral pleural effusions, left greater than right.  Bibasilar atelectasis/consolidation is present, increased since the prior study.  As identified previously, there is a large amount of ascites. Peritoneal nodules and omental stranding are again identified, consistent with carcinomatosis.  The liver shows no focal lesions.  No focal abnormality identified within the spleen, pancreas, or adrenal glands.  A hiatal hernia is present.  Small bowel loops show no obstruction although surrounded by mesenteric edema and ascites.  The gallbladder contains dependent, calcified gallstone.  Colonic loops are relatively collapsed.  Within the pelvis, solid and cystic  adnexal masses are again noted, unchanged in appearance.  Uterine fibroids are present.  A Foley catheter decompresses the bladder.  There is diffuse body wall edema.  Degenerative changes are seen in the spine.  There is dense atherosclerotic calcification of the abdominal aorta but no aneurysm.  IMPRESSION:  1.  Large amount of ascites, peritoneal and omental disease, consistent with carcinomatosis. 2.  No evidence for bowel obstruction. 3.  Complex bilateral ovarian masses.  Differential diagnosis includes primary ovarian malignancy or ovarian metastases, which can originate from other sites such as stomach, pancreas, and colon primaries.  Original Report Authenticated By: Patterson Hammersmith, M.D.   Dg Chest 2 View  02/11/2012  *RADIOLOGY REPORT*  Clinical Data: Shortness of breath, cough, hypoxia.  CHEST - 2 VIEW  Comparison: None  Findings: Very low lung volumes with bibasilar atelectasis.  Small effusions present.  Heart size is borderline, accentuated by the low volumes.  Mild vascular congestion.  IMPRESSION: Very low lung volumes with bibasilar atelectasis and small effusions.  Original Report Authenticated By: Cyndie Chime, M.D.   Ct Abdomen Pelvis W Contrast  02/01/2012  *RADIOLOGY REPORT*  Clinical Data:  Abdominal distention, malignant right colonic ulcer, suspected ascites, lower abdominal pain, nausea, question metastatic cancer  CT ABDOMEN AND PELVIS WITH CONTRAST  Technique:  Multidetector CT imaging of the abdomen and pelvis was performed following the standard protocol during bolus administration of intravenous contrast. Sagittal and coronal MPR images reconstructed from axial data set.  Contrast: 80mL OMNIPAQUE IOHEXOL 300 MG/ML  SOLN Dilute oral contrast.  Comparison: None  Findings: Subsegmental atelectasis bilateral lower lobes greater on left. Minimal left pleural effusion. Significant ascites. Scattered atherosclerotic calcifications aorta and coronary arteries. Cholelithiasis. Liver,  spleen, pancreas, kidneys, adrenal glands normal appearance.  Enhancing 1.4 x 1.6 cm diameter probable leiomyoma within the uterus. Additional enhancing nodule adjacent to the uterine fundus question exophytic leiomyoma, 2.3 x 2.0 cm image 82. Complex solid and cystic appearing mass in left adnexa, 5.0 x 6.2 cm suspicious for a cystic ovarian neoplasm. Additional right ovarian soft tissue mass 3.9 x 2.9 cm image 80. Enlarged right inguinal lymph node 2.5 x 1.5 cm image 88. Enlarged left periaortic lymph node 2.1 x 1.6 x 3.3 cm image 46.  Omental infiltration and soft tissue density consistent with omental tumor/caking. Additionally, slight nodular appearance to the undersurface of the right diaphragm question peritoneal tumor implants. Findings stranding is identified within the perihepatic ascites question adhesions, tumor strands, or septations within the ascitic fluid. Probable hiatal hernia. Remainder stomach decompressed. No definite colonic tumor identified. Redundant cecum in right upper quadrant. Appendix not definitely localized. No definite focal gastric or bowel abnormality otherwise identified. No definite acute osseous findings.  IMPRESSION: Bilateral ovarian masses, larger and more cystic on left, highly suspicious for ovarian neoplasms. Significant ascites with evidence of peritoneal carcinomatosis. Retroperitoneal and right inguinal adenopathy. Cholelithiasis. Hiatal hernia. No definite focal bowel lesion / mass evident by CT.  Original  Report Authenticated By: Lollie Marrow, M.D.    Medications: Scheduled Meds:    . aluminum hydroxide  30 mL Oral TID  . atorvastatin  40 mg Oral Daily  . enoxaparin (LOVENOX) injection  30 mg Subcutaneous Q24H  . ezetimibe  10 mg Oral Daily  . glucose-Vitamin C      . insulin aspart  0-9 Units Subcutaneous TID WC  . sodium chloride  3 mL Intravenous Q12H   Continuous Infusions:    . dextrose 5 % and 0.45% NaCl 100 mL/hr at 02/12/12 0555  . DISCONTD:  sodium chloride 75 mL/hr at 02/11/12 1413     Assessment/Plan: Active Problems:  ARF (acute renal failure): Only slight improvement, no vomiting or any diarrhea, urine output increased to 225 cc - Hold off on Lasix, NSAIDs, ACE inhibitors, continue IV fluid - Renal ultrasound obtained, no hydronephrosis  - Will continue to follow renal recommendations, no plans of any hemodialysis yet.   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 Nelly Rout on August 8th. - Discussed with Dr. Darrold Span as well, will follow patient today, but limited options given acute renal insufficiency.   Diabetes mellitus: - Start on sliding scale insulin once placed on diet   Ascites: Status post paracentesis on 02/11/2012, 5 L removed. Cytology pending, culture so far negative. - Likely malignant due to newly diagnosed metastatic ovarian CA, pericardial carcinomatosis   Hypertension currently hypotensive: - Hold off on any antihypertensives, continue gentle hydration   Hyperlipidemia: Continue Zetia and atorvastatin  DVT Prophylaxis: Lovenox  Code Status:  Disposition: Not medically ready   LOS: 2 days   Quintan Saldivar M.D. [facility] 02/12/2012, 1:11 PM Pager: [phone]  If 7PM-7AM, please contact night-coverage [email or web address] Password [email or web address]

## 2012-02-12 NOTE — Progress Notes (Signed)
Patient's BP 82/44,patient asymptomatic,MD Niu on the unit doing round,notified of pt's low bp.No new order. Vinaya Sancho Joselita,RN

## 2012-02-12 NOTE — Progress Notes (Signed)
Medical Oncology Consultation 02-12-12  Ann Baldwin MR#: [id number]  DOB: 1946/05/31 CSN#: [id number]  Referring MD: [facility]  Primary MD: Dr. Rudi Heap Ann Cradle, NP) with [facility]  Reason for Consult: apparent gyn carcinoma  NWG:NFAOZHY Ann Baldwin is a 66 y.o. female who initially presented to PCP with "stomach problems" thought possibly IBS, including diarrhea with dark stools each AM for at least a month; she also had progressive abdominal distension for ~ 4 months. She was sent for colonoscopy 01-26-12, as she had not had this exam previously, with an ulcerative lesion of the ascending colon biopsied by Dr. Leone Payor, with adenocarcinoma (I cannot locate that path report in EMR now); I understand that this did not appear to be primary from colon at endoscopy. CT abdomen and pelvis with IV contrast on 02/01/2012 demonstrated a 5x6.2 cm complex solid and cystic left adnexal mass, 3.9 x 2.9 right ovarian soft tissue mass,omental infiltration and soft tissue density consistent with omental tumor/caking. Additionally, slight nodular appearance to the undersurface of the right diaphragm suspicious for peritoneal tumor implants. Findings stranding is identified within the perihepatic ascites question adhesions, tumor strands, or septations within the ascitic fluid. Dr. Laurette Schimke, GYN Oncology evaluated patient on 02/09/12; additional immunostains were requested on the pathology, which I believe are still pending. Based on the CT and elevated CA 125 at [address].2, with CEA of 0.5, Dr Nelly Rout felt this was consistent with gyn primary and was considering surgery vs neoadjuvant chemotherapy; she was to be seen  by Dr. Darrold Span at [facility] on 02-12-12. However, CMET performed on 7/30 revealed acute renal insufficiency, with a creatinine level of 8.78 , for which she was admitted by the Hospitalist team on 02-10-12. Repeat CT of the abdomen and pelvis without contrast on 7/31  confirmed again large amount of ascites, peritoneal and omental disease, consistent with carcinomatosis. No evidence for bowel obstruction. Complex bilateral ovarian masses were again seen. We were informed of the patient's admission while her metabolic issues are being managed by the Nephrology and IM team.   Patient had paracentesis of 5 liters ascites on 02-11-12, with improvement in that discomfort. Cytology is pending. I/O past 24 hrs 2494/ 225  (not counting paracentesis) PMH:  Past Medical History   Diagnosis  Date   .  Diabetes mellitus    .  Hyperlipidemia    .  Hypertension    .  Ovarian cancer    Anemia   Surgeries:  Past Surgical History   Procedure  Date   .  No past surgeries    Allergies: No Known Allergies  Medications:  Prior to Admission:  Prescriptions prior to admission   Medication  Sig  Dispense  Refill   .  amLODipine-benazepril (LOTREL) 10-20 MG per capsule  Take 1 capsule by mouth daily.     Marland Kitchen  aspirin 81 MG tablet  Take 81 mg by mouth daily.     Marland Kitchen  atorvastatin (LIPITOR) 40 MG tablet  Take 40 mg by mouth daily.     Marland Kitchen  ezetimibe (ZETIA) 10 MG tablet  Take 10 mg by mouth daily.     .  furosemide (LASIX) 20 MG tablet  Take 20 mg by mouth daily.     Marland Kitchen  HYDROcodone-acetaminophen (NORCO/VICODIN) 5-325 MG per tablet  Take 1 tablet by mouth every 4 (four) hours as needed. For pain     .  ondansetron (ZOFRAN) 8 MG tablet  Take 4-8 mg by mouth every  8 (eight) hours as needed. For nausea     .  sitaGLIPtin (JANUVIA) 100 MG tablet  Take 100 mg by mouth daily.     Marland Kitchen  ibuprofen (ADVIL,MOTRIN) 600 MG tablet  Take 600 mg by mouth every 6 (six) hours as needed. For pain     ZOX:[id number], ondansetron, zolpidem  ROS:  Constitutional: Positive for weight loss, unknown amount.Weight ~ a year ago was 170 lbs. Poor appetite for about 3 months with early satiety. Negative for fever, chills and malaise/fatigue.  Eyes: Negative for blurred vision and double vision.  Uses reading glasses No known active dental problems. No thyroid symptoms. Respiratory: Some NP cough. No hemoptysis. Shortness of breath secondary to ascites. No pleuritic chest pain.  Cardiovascular: Negative for chest pain. No palpitations.  GI: Positive for nausea,no vomiting, and positive for Intermittent diarrhea for about 4 weeks, initially thought to be due to IBS . Initial symptoms began in 10/2011 and were accompanied by sharp LLQ pain . Abdominal tightness. Positive for change in bowel caliber, smaller diameter. No Melena or hematochezia.  GU: No blood in urine. No loss of urinary control. Decreased urinary output for about 4 days  Skin: Negative for itching. No rash. No petechia. No easy bruising  Neurological: No headaches. No motor or sensory deficits.  Overdue mammograms but no changes on breast self exam. No LE swelling No noted bleeding  Family History:  Sister with cancer ? Uterus "treated with medicine" No other known cancer  Cardiac disease in father, HTN in son  Past Gynecological History: G2 P2 LNMP in 40's. Menarche 12 regular menses until menopause. No contraceptives used. No h/o abnormal pap test, Last pap 10 years ago. LAst MGM 1995. NSVD x 2. No LMP recorded. Patient is postmenopausal.  Social History: reports that she has never smoked. She has never used smokeless tobacco. She reports that she does not drink alcohol or use illicit drugs. From [place] where she lives with 1 son, is divorced. Retired from work with furniture and sewing. Has 2 sons and 2 grandchildren.   Physical Exam  Filed Vitals:    02/11/12 1005   BP:  95/59   Pulse:  83   Temp:    Resp:    Vitals now 98.3, 83 regular, 18 not labored supine on [place] O2, 99/45, O2 sat 92% IVF D5 .45 NS at 100 via right antecubital IV, site ok No urine output charted in EMR yet today, tho urine present in foley collection. General: 61 -year-old white female in no acute distress A. and O. x3 well-developed.  Ill appearing.  HEENT: Normocephalic, atraumatic, PERRLA. Oral cavity without thrush or lesions.  Neck supple. no thyromegaly, no cervical or supraclavicular adenopathy  Lungs decreased breath sounds at the bases. No wheezing, rhonchi or rales. No axillary masses.  Breasts: not examined.  Cardiac regular rate and rhythm normal S1-S2, no murmur , rubs or gallops  Abdomen TTP on the LLQ. Abdomen distended,bowel sounds x4. No HSM. No masses palpable. Since paracentesis not tight tho still fluid wave, not tender, active BS. GU/rectal: deferred. Foley in, with some urine. Extremities no clubbing cyanosis or edema. No bruising or petechial rash. Poor skin turgor.  Musculoskeletal: no spinal tenderness.  Neuro: Non Focal  Labs:  CBC  Lab  02/10/12 1740   WBC  5.3   HGB  10.7*   HCT  31.8*   PLT  407*   MCV  86.4   MCH  29.1   MCHC  33.6   RDW  15.7*   LYMPHSABS  --   MONOABS  --   EOSABS  --   BASOSABS  --   BANDABS  --   CMP  Lab  02/10/12 1740  02/09/12 1011   NA  135  139   K  4.3  4.4   CL  97  101   CO2  17*  18*   GLUCOSE  93  91   BUN  68*  63*   CREATININE  9.03*  8.78*   CALCIUM  9.0  8.8   MG  1.7  --   AST  --  9   ALT  --  <8   ALKPHOS  --  74   BILITOT  --  0.2*    02-12-12 Na 135, K 4.7, CO2 18, glu 121, BUN 68, creat 8.2, Ca 7.5, Phos 6.8, alb 2.1   No repeat CBC since 02-10-12   Component  Value  Date/Time    BILITOT  0.2*  02/09/2012 1011   No results found for this basename: INR:5,PROTIME:5 in the last 168 hours  No results found for this basename: DDIMER:2 in the last 72 hours  Anemia panel: iron 23, %sat 13, ferritin pending Folate normal 9.8, B12 just under normal at 206 Imaging Studies:  Ct Abdomen Pelvis Wo Contrast  02/11/2012 **ADDENDUM** CREATED: 02/11/2012 00:27:29 The patient has new renal failure. No evidence for hydronephrosis. There is a persistent nephrogram on this noncontrast exam, a finding which can be associated with contrast nephropathy.  The patient was last given intravenous contrast on 02/01/2012. **END ADDENDUM** SIGNED BY: Blair Hailey. Manson Passey, M.D.  02/10/2012 *RADIOLOGY REPORT* Clinical Data: Abdominal pain. Diarrhea, nausea. Colonoscopy 07/16 showed a large ulceration in the ascending colon, pathology showing adenocarcinoma. CT ABDOMEN AND PELVIS WITHOUT CONTRAST Technique: Multidetector CT imaging of the abdomen and pelvis was performed following the standard protocol without intravenous contrast. Comparison: CT of the abdomen pelvis 02/01/2012 Findings: There are bilateral pleural effusions, left greater than right. Bibasilar atelectasis/consolidation is present, increased since the prior study. As identified previously, there is a large amount of ascites. Peritoneal nodules and omental stranding are again identified, consistent with carcinomatosis. The liver shows no focal lesions. No focal abnormality identified within the spleen, pancreas, or adrenal glands. A hiatal hernia is present. Small bowel loops show no obstruction although surrounded by mesenteric edema and ascites. The gallbladder contains dependent, calcified gallstone. Colonic loops are relatively collapsed. Within the pelvis, solid and cystic adnexal masses are again noted, unchanged in appearance. Uterine fibroids are present. A Foley catheter decompresses the bladder. There is diffuse body wall edema. Degenerative changes are seen in the spine. There is dense atherosclerotic calcification of the abdominal aorta but no aneurysm. IMPRESSION: 1. Large amount of ascites, peritoneal and omental disease, consistent with carcinomatosis. 2. No evidence for bowel obstruction. 3. Complex bilateral ovarian masses. Differential diagnosis includes primary ovarian malignancy or ovarian metastases, which can originate from other sites such as stomach, pancreas, and colon primaries. Original Report Authenticated By: Patterson Hammersmith, M.D.  Dg Chest 2 View  02/11/2012 *RADIOLOGY REPORT*  Clinical Data: Shortness of breath, cough, hypoxia. CHEST - 2 VIEW Comparison: None Findings: Very low lung volumes with bibasilar atelectasis. Small effusions present. Heart size is borderline, accentuated by the low volumes. Mild vascular congestion. IMPRESSION: Very low lung volumes with bibasilar atelectasis and small effusions. Original Report Authenticated By: Cyndie Chime, M.D.  Ct Abdomen Pelvis W Contrast  02/01/2012 *RADIOLOGY  REPORT* Clinical Data: Abdominal distention, malignant right colonic ulcer, suspected ascites, lower abdominal pain, nausea, question metastatic cancer CT ABDOMEN AND PELVIS WITH CONTRAST Technique: Multidetector CT imaging of the abdomen and pelvis was performed following the standard protocol during bolus administration of intravenous contrast. Sagittal and coronal MPR images reconstructed from axial data set. Contrast: 80mL OMNIPAQUE IOHEXOL 300 MG/ML SOLN Dilute oral contrast. Comparison: None Findings: Subsegmental atelectasis bilateral lower lobes greater on left. Minimal left pleural effusion. Significant ascites. Scattered atherosclerotic calcifications aorta and coronary arteries. Cholelithiasis. Liver, spleen, pancreas, kidneys, adrenal glands normal appearance. Enhancing 1.4 x 1.6 cm diameter probable leiomyoma within the uterus. Additional enhancing nodule adjacent to the uterine fundus question exophytic leiomyoma, 2.3 x 2.0 cm image 82. Complex solid and cystic appearing mass in left adnexa, 5.0 x 6.2 cm suspicious for a cystic ovarian neoplasm. Additional right ovarian soft tissue mass 3.9 x 2.9 cm image 80. Enlarged right inguinal lymph node 2.5 x 1.5 cm image 88. Enlarged left periaortic lymph node 2.1 x 1.6 x 3.3 cm image 46. Omental infiltration and soft tissue density consistent with omental tumor/caking. Additionally, slight nodular appearance to the undersurface of the right diaphragm question peritoneal tumor implants. Findings stranding is identified within  the perihepatic ascites question adhesions, tumor strands, or septations within the ascitic fluid. Probable hiatal hernia. Remainder stomach decompressed. No definite colonic tumor identified. Redundant cecum in right upper quadrant. Appendix not definitely localized. No definite focal gastric or bowel abnormality otherwise identified. No definite acute osseous findings. IMPRESSION: Bilateral ovarian masses, larger and more cystic on left, highly suspicious for ovarian neoplasms. Significant ascites with evidence of peritoneal carcinomatosis. Retroperitoneal and right inguinal adenopathy. Cholelithiasis. Hiatal hernia. No definite focal bowel lesion / mass evident by CT. Original Report Authenticated By: Lollie Marrow, M.D.  COLONOSCOPY PROCEDURE REPORT  PROCEDURE DATE: 01/26/2012  PROCEDURE: Colonoscopy with biopsy  ASA CLASS: Class III  INDICATIONS: Routine Risk Screening  DESCRIPTION OF PROCEDUREThe LB PCF-Q180AL [id number] endoscope was introduced through the anus and advanced to the cecum, which was identified by both the appendix and ileocecal valve, without limitations. The quality of the prep was excellent, using MoviPrep. The instrument was then slowly withdrawn as the colon was fully examined.<PROCEDUREIMAGES>>  FINDINGS: An ulcer was found in the ascending colon. Large ulcer, firm with heaped up margins. Difficult to see and obtain biopsies. Position changes and abdominal pressure utilized. This was otherwise a normal examination of the colon, though colon was redundant. Retroflexed views in the rectum revealed internal and external hemorrhoids. The time to cecum = 12:20 minutes. The scope was then withdrawn in 17 minutes from the cecum and the procedure completed.  COMPLICATIONS: None  ENDOSCOPIC IMPRESSION:  1) Ulcer in the ascending colon - biopsied  2) Internal and external hemorrhoids  3) Otherwise normal examination - excellent prep  Iva Boop, MD, Clementeen Graham  CC: Ann Cradle, NP and  The Patient n.  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 She is much more comfortable since paracentesis on 02-11-12; if ascites rapidly reaccumulates may need this procedure repeated at least until we hopefully improve situation with chemotherapy. Expect we will begin neoadjuvant chemotherapy, perhaps with single agent taxane, when renal situation allows. I have asked my partners to see her this weekend and I will see again on 02-15-12; please call between our rounds if needed. 2.acute renal  failure, not quite anuric: likely multifactorial with intravascular dehydration related to malignant ascites, diarrhea, ACE, NSAID, underlying diabetes. Creatinine slightly better today. Nephrology following. 3. Anemia: also probably multifactorial with low iron, chronic disease , renal failure. Would consider IV iron, or at least po. 4. Diabetes on insulin coverage for CBGs  Jama Flavors, MD [phone]

## 2012-02-12 NOTE — Progress Notes (Addendum)
Subjective:  Patient feels better. Still has nausea, but no vomiting or diarrhea. She has mild abdominal pain over the paracentesis site. Afebrile.   Objective: Vital signs in last 24 hours: Filed Vitals:   02/11/12 1739 02/11/12 2111 02/12/12 0538 02/12/12 0908  BP: 85/47 85/48 82/44  85/47  Pulse: 88 81 84 86  Temp: 98.1 F (36.7 C) 97.9 F (36.6 C) 98.3 F (36.8 C) 98.1 F (36.7 C)  TempSrc: Oral Oral Oral Oral  Resp: 16 16 16 16   Height:  5' 3.89" (1.623 m)    Weight:  169 lb 15.6 oz (77.1 kg)    SpO2: 90% 90% 90% 91%   Weight change: 1 lb 7.6 oz (0.669 kg)  Intake/Output Summary (Last 24 hours) at 02/12/12 1244 Last data filed at 02/12/12 0900  Gross per 24 hour  Intake 2614.17 ml  Output    225 ml  Net 2389.17 ml   Urine: 225 ml  Physical Exam:   Vitals: Tm: 98.5     HR:  84   BP: 82/44 to 109/53     RR:16  O2 saturation:  90  General: resting in bed, not in acute distress HEENT: PERRL, EOMI, no scleral icterus Cardiac: S1/S2, RRR, No murmurs, gallops or rubs Pulm: Decreased air movement at base bilaterally, No rales, wheezing, rhonchi or rubs. Abd: Distended, mild tender over RLQ, no rebound pain, no organomegaly, BS present Ext: No rashes. No edema, 2+DP/PT pulse bilaterally Musculoskeletal: No joint deformities, erythema, or stiffness, ROM full and nontender Skin: no rashes. No skin bruise. Neuro: alert and oriented X3, cranial nerves II-XII grossly intact, muscle strength 5/5 in all extremeties,  sensation to light touch intact.    Lab Results: Basic Metabolic Panel:  Lab 02/12/12 4782 02/10/12 1740  NA 135 135  K 4.7 4.3  CL 102 97  CO2 18* 17*  GLUCOSE 121* 93  BUN 68* 68*  CREATININE 8.21* 9.03*  CALCIUM 7.5* 9.0  MG -- 1.7  PHOS 6.8* 7.5*      Lab 02/12/12 0816 02/10/12 1740 02/09/12 1011  CREATININE 8.21* 9.03* 8.78*    Liver Function Tests:  Lab 02/12/12 0816 02/09/12 1011  AST -- 9  ALT -- <8  ALKPHOS -- 74  BILITOT -- 0.2*    PROT -- 6.5  ALBUMIN 2.1* 3.4*   CBC:  Lab 02/10/12 1740  WBC 5.3  NEUTROABS --  HGB 10.7*  HCT 31.8*  MCV 86.4  PLT 407*   CBG:  Lab 02/12/12 1132 02/12/12 0730 02/11/12 2115 02/11/12 1742 02/11/12 1656 02/11/12 1225  GLUCAP 142* 132* 122* 113* 59* 78   Hemoglobin A1C:  Lab 02/11/12 0016  HGBA1C 6.1*   Anemia Panel:  Lab 02/11/12 1952  VITAMINB12 206*  FOLATE 9.8  FERRITIN 105  TIBC 173*  IRON 23*  RETICCTPCT 0.8   Urinalysis:  Lab 02/10/12 1836  COLORURINE AMBER*  LABSPEC 1.032*  PHURINE 5.0  GLUCOSEU NEGATIVE  HGBUR TRACE*  BILIRUBINUR SMALL*  KETONESUR TRACE*  PROTEINUR NEGATIVE  UROBILINOGEN 0.2  NITRITE NEGATIVE  LEUKOCYTESUR NEGATIVE  Urine sodium 39 Urine creatinine 352 Fractional sodium excretion 0.67% Fractional urea excretion 6.7% Micro Results: Recent Results (from the past 240 hour(s))  BODY FLUID CULTURE     Status: Normal (Preliminary result)   Collection Time   02/11/12  3:43 PM      Component Value Range Status Comment   Specimen Description FLUID ASCITIC   Final    Special Requests   Final  Gram Stain     Final    Value: RARE WBC PRESENT,BOTH PMN AND MONONUCLEAR     NO ORGANISMS SEEN   Culture NO GROWTH   Final    Report Status PENDING   Incomplete    Studies/Results: Ct Abdomen Pelvis Wo Contrast  02/11/2012  **ADDENDUM** CREATED: 02/11/2012 00:27:29  The patient has new renal failure. No evidence for hydronephrosis. There is a persistent nephrogram on this noncontrast exam, a finding which can be associated with contrast nephropathy.  The patient was last given intravenous contrast on 02/01/2012.  **END ADDENDUM** SIGNED BY: Blair Hailey. Manson Passey, M.D.   02/10/2012  *RADIOLOGY REPORT*  Clinical Data: Abdominal pain.  Diarrhea, nausea.  Colonoscopy 07/16 showed a large ulceration in the ascending colon, pathology showing adenocarcinoma.  CT ABDOMEN AND PELVIS WITHOUT CONTRAST  Technique:  Multidetector CT imaging of the abdomen  and pelvis was performed following the standard protocol without intravenous contrast.  Comparison: CT of the abdomen pelvis 02/01/2012  Findings: There are bilateral pleural effusions, left greater than right.  Bibasilar atelectasis/consolidation is present, increased since the prior study.  As identified previously, there is a large amount of ascites. Peritoneal nodules and omental stranding are again identified, consistent with carcinomatosis.  The liver shows no focal lesions.  No focal abnormality identified within the spleen, pancreas, or adrenal glands.  A hiatal hernia is present.  Small bowel loops show no obstruction although surrounded by mesenteric edema and ascites.  The gallbladder contains dependent, calcified gallstone.  Colonic loops are relatively collapsed.  Within the pelvis, solid and cystic adnexal masses are again noted, unchanged in appearance.  Uterine fibroids are present.  A Foley catheter decompresses the bladder.  There is diffuse body wall edema.  Degenerative changes are seen in the spine.  There is dense atherosclerotic calcification of the abdominal aorta but no aneurysm.  IMPRESSION:  1.  Large amount of ascites, peritoneal and omental disease, consistent with carcinomatosis. 2.  No evidence for bowel obstruction. 3.  Complex bilateral ovarian masses.  Differential diagnosis includes primary ovarian malignancy or ovarian metastases, which can originate from other sites such as stomach, pancreas, and colon primaries.  Original Report Authenticated By: Patterson Hammersmith, M.D.   Dg Chest 2 View  02/11/2012  *RADIOLOGY REPORT*  Clinical Data: Shortness of breath, cough, hypoxia.  CHEST - 2 VIEW  Comparison: None  Findings: Very low lung volumes with bibasilar atelectasis.  Small effusions present.  Heart size is borderline, accentuated by the low volumes.  Mild vascular congestion.  IMPRESSION: Very low lung volumes with bibasilar atelectasis and small effusions.  Original Report  Authenticated By: Cyndie Chime, M.D.   Nm Pulmonary Perfusion  02/11/2012  *RADIOLOGY REPORT*  Clinical Data: Hypoxemia.  Cancer.  NM PULMONARY PERFUSION PARTICULATE  Radiopharmaceutical: CURIE MAA TECHNETIUM TO 86M ALBUMIN AGGREGATED  Comparison: Chest radiograph 02/11/2012.  Findings: There is mildly heterogeneous perfusion of the left lung, compatible with appearance on chest radiograph with thickening of the fissure and lower lobe atelectasis best demonstrated on the left lateral projection.  Right lung appears well perfused.  There is volume loss at the left base.  IMPRESSION: Low probability pulmonary embolus.  Original Report Authenticated By: Andreas Newport, M.D.   US Renal  02/11/2012  *RADIOLOGY REPORT*  Clinical Data: Acute renal failure, abdominal ascites  RENAL/URINARY TRACT ULTRASOUND COMPLETE  Comparison:  02/10/2012 CT exam  Findings:  Right Kidney:  9.4 cm length.  Normal cortex and echogenicity.  No hydronephrosis or  obstruction.  No focal abnormality.  Left Kidney:  10.1 cm length.  Limited visualization.  No hydronephrosis or definite focal abnormality.  Bladder:  Decompressed by Foley catheter  Abdominal ascites noted as well as bilateral pleural effusions.  IMPRESSION: No acute finding by ultrasound.  No hydronephrosis.  Original Report Authenticated By: Judie Petit. Ruel Favors, M.D.   US Paracentesis  02/11/2012  *RADIOLOGY REPORT*  Clinical Data: Abdominal discomfort, possible ovarian cancer, large volume ascites  ULTRASOUND GUIDED PARACENTESIS  Comparison:  None  An ultrasound guided paracentesis was thoroughly discussed with the patient and questions answered.  The benefits, risks, alternatives and complications were also discussed.  The patient understands and wishes to proceed with the procedure.  Written consent was obtained.  Ultrasound was performed to localize and mark an adequate pocket of fluid in the right lower quadrant of the abdomen.  The area was then prepped and draped  in the normal sterile fashion.  1% Lidocaine was used for local anesthesia.  Under ultrasound guidance a 19 gauge Yueh catheter was introduced.  Paracentesis was performed.  The catheter was removed and a dressing applied.  Complications:  None  Findings:  A total of approximately 5 liters of turbid dark yellow fluid was removed.  A fluid sample was sent for laboratory analysis.  IMPRESSION: Successful ultrasound guided paracentesis yielding 5 liters of ascites.  Read by Brayton El PA-C  Original Report Authenticated By: Richarda Overlie, M.D.   Medications: Scheduled Meds:    . aluminum hydroxide  30 mL Oral TID  . atorvastatin  40 mg Oral Daily  . enoxaparin (LOVENOX) injection  30 mg Subcutaneous Q24H  . ezetimibe  10 mg Oral Daily  . glucose-Vitamin C      . insulin aspart  0-9 Units Subcutaneous TID WC  . sodium chloride  3 mL Intravenous Q12H   Continuous Infusions:    . dextrose 5 % and 0.45% NaCl 100 mL/hr at 02/12/12 0555  . DISCONTD: sodium chloride 75 mL/hr at 02/11/12 1413   PRN Meds:.HYDROcodone-acetaminophen, ondansetron, ondansetron, technetium albumin aggregated, zolpidem   Assessment/Plan:  #: ARF: It is most likely multifactorial, including prerenal secondary to volume depletion due to nausea/vomiting/diarrhea, NSIADs, ACEI and IV contrast, HTN and DM-2. Currently patient's Creatinine is 8.2 down from 9.  Patient dose not seem to be fluid overloaded. Her lung is clear at middle and upper lobes. Her chest x-ray showed very low lung volumes with bibasilar atelectasis and small effusions, which is most likely due to abdominal distension secondary to ascites. Patient does not have leg edema. Patient's Na and K are normal. Patient's N/V/abdominal pain could be due to uremic syndrome, but it can also be due to ascites and abdominal tumors. Renal ultrasound did not show hydronephrosis.    FeNa 0.67% /FeUrea 6.7% - both compatible with is with pre-renal state - Continue IV: NS 100  cc/h - Pending urine Eosinophils - d/c'd Lasix, Amlodipine, Benazepril, nsaids   #: Ovarian mass: CA 125 elevated. Oncology was consulted and is evaluating  #: Ascites: Paracentesis was performed. 5 L of slightly turbid dark yellow fluid was removed. Pending related lab.  #: DM-2: On SSI and d/c Januvia  #: HTN: blood pressure is running low currently. D/c'd blood pressure medicaions.  #: Borderline hypoxemia: it is most likely due to deceased air movement secondary to severe ascitics. Since patient has possible malignancy which increased the possibility of PE. VQ scan was negative for PE.   #: Hyperphosphoremia:  Treat with aluminum  hydroxide 30 ml, tid, po for 72 hours.  #: Anemia: Hgb was 10.7 on 02/10/12. It is likely due to possible malignant ovarian cancer and blood lose from colon given her large ulcer in colon. Anemia panel showed low iron and decreased TIBC and normal ferritin, indicating possible chronic disease anemia. Vb12 is 206. Will check methylmalonic acid level.  Lorretta Harp, MD PGY2, Internal Medicine Teaching Service Pager: [phone]  I have seen and examined this patient and agree with plan as outlined in the above note with highlighted additions. 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  Urinary indices fit with a prerenal picture. Creatinine has improved slightly with IVF and tap of abdomen - will continue fluids and careful observation.  Oncology evaluating at present.   I spoke with patient about her wishes regarding dialysis if renal function fails to improve.  She says as long as aggressive treatment for her tumor is possible/available, that she would wish to undergo dialysis treatments if need be.  At this time no urgent need, but her wishes have  been clarified just in case. Adonte Vanriper B,MD 02/12/2012 4:09 PM   Addendum:  Hyperphosphatemia:  although short term of aluminum hydroxide treatment is less likely to cause significant aluminum toxicity, it is better to switch it to calcium acetate. Will d/c aluminum hydroxide and start calcium acetate today.   LOS: 2 days   Lorretta Harp 02/12/2012, 12:44 PM

## 2012-02-12 NOTE — Progress Notes (Signed)
Gynecologic Oncology  HPI:  66 y/o G2P2 presented in April 2013 with abdominal discomfort, diarrhea, and nausea that were thought to be related to IBD.  A colonoscopy was performed on 01/26/2012 due to the symptoms persisting. Findings were notable for a large ulcer with heaped up margins in the ascending colon. Although difficult to see, biopsies were obtained. Pathology was consistent with adenocarcinoma with the concern that it was not a primary colonic adenocarcinoma.  CT of the abdomen and pelvis on 02/01/2012 showed a 5x6.2 cm complex solid and cystic left adnexal mass, 3.9x2.9 right ovarian soft tissue mass, omental caking, and peritoneal carcinomatosis.  During the initial consult with Dr. Nelly Rout on February 09, 2012, the patient denied rectal bleeding, vaginal bleeding, or weight loss.  She reported a poor appetite, early satiety, and abdominal distention for two months.  Plans were made for the patient to see Dr. Darrold Span on Friday, February 12, 2012 to evaluate for chemotherapy, which was cancelled due to ED admission.  Lab work was obtained during the initial visit on 02/09/12 with abnormal finding of CA 125 level at 22,938.2.  On 02/10/12, a Cmet was added on to the blood drawn on 02/09/12 with findings of BUN 63 and serum creatinine of 8.78.  The patient was advised to go to the Emergency Room to evaluate and treat renal function by Dr. Nelly Rout on 02/10/12.     Subjective: Patient reports "feeling better since the fluid was pulled off."  Reporting 5 L removed during paracentesis.  Reporting being "sick on my stomach when I tried to eat this morning."  Denies pain at this time.  Denies having a bowel movement.  Objective: Vital signs in last 24 hours: Temp:  [97.9 F (36.6 C)-98.3 F (36.8 C)] 98.1 F (36.7 C) (08/02 0908) Pulse Rate:  [81-88] 86  (08/02 0908) Resp:  [16] 16  (08/02 0908) BP: (82-109)/(44-59) 85/47 mmHg (08/02 0908) SpO2:  [90 %-91 %] 91 % (08/02 0908) Weight:  [169 lb 15.6 oz  (77.1 kg)] 169 lb 15.6 oz (77.1 kg) (08/01 2111) Last BM Date: 02/10/12  Intake/Output from previous day: 08/01 0701 - 08/02 0700 In: 2494.2 [P.O.:480; I.V.:2014.2] Out: 225 [Urine:225]  Physical Examination: General: alert, cooperative and no distress Resp: clear to auscultation bilaterally Cardio: regular rate and rhythm, S1, S2 normal, no murmur, click, rub or gallop GI: soft, non-tender; bowel sounds normal; no masses,  no organomegaly  Labs:   BUN/Cr/glu/ALT/AST/amyl/lip:  68/8.21/--/--/--/--/-- (08/02 1478)  Assessment/Plan:  Plan is to normalize renal function then continue with plans for either surgery performed by GYN Oncology vs. most likely neoadjuvant chemotherapy with subsequent surgery.  Plans to have patient see Dr. Nelly Rout on February 18, 2012 in clinic to reconsult.  Dr. Darrold Span is aware of the following patient.  Appointment with her was cancelled for February 12, 2012 until renal failure corrected.  Plan to reschedule appointment with Dr. Darrold Span after 02/18/12 visit with Dr. Nelly Rout.  Dr. Nelly Rout can be reached at [phone].     Sharna Gabrys DEAL 02/12/2012, 10:02 AM

## 2012-02-13 DIAGNOSIS — C569 Malignant neoplasm of unspecified ovary: Secondary | ICD-10-CM

## 2012-02-13 DIAGNOSIS — C800 Disseminated malignant neoplasm, unspecified: Secondary | ICD-10-CM

## 2012-02-13 DIAGNOSIS — R180 Malignant ascites: Secondary | ICD-10-CM

## 2012-02-13 DIAGNOSIS — N179 Acute kidney failure, unspecified: Principal | ICD-10-CM

## 2012-02-13 LAB — GLUCOSE, CAPILLARY
Glucose-Capillary: 107 mg/dL — ABNORMAL HIGH (ref 70–99)
Glucose-Capillary: 121 mg/dL — ABNORMAL HIGH (ref 70–99)
Glucose-Capillary: 126 mg/dL — ABNORMAL HIGH (ref 70–99)
Glucose-Capillary: 104 mg/dL — ABNORMAL HIGH (ref 70–99)

## 2012-02-13 LAB — BASIC METABOLIC PANEL
CO2: 18 mEq/L — ABNORMAL LOW (ref 19–32)
Chloride: 99 mEq/L (ref 96–112)
Creatinine, Ser: 7.29 mg/dL — ABNORMAL HIGH (ref 0.50–1.10)
GFR calc Af Amer: 6 mL/min — ABNORMAL LOW (ref >90)
Potassium: 4 mEq/L (ref 3.5–5.1)
Sodium: 131 mEq/L — ABNORMAL LOW (ref 135–145)

## 2012-02-13 MED ORDER — SODIUM CHLORIDE 0.9 % IV SOLN
INTRAVENOUS | Status: DC
  Administered 2012-02-13 – 2012-02-17 (×5): via INTRAVENOUS

## 2012-02-13 MED ORDER — POLYSACCHARIDE IRON COMPLEX 150 MG PO CAPS
150.0000 mg | ORAL_CAPSULE | Freq: Every day | ORAL | Status: DC
  Administered 2012-02-13 – 2012-02-17 (×5): 150 mg via ORAL
  Filled 2012-02-13 (×5): qty 1

## 2012-02-13 MED ORDER — SODIUM BICARBONATE 650 MG PO TABS
650.0000 mg | ORAL_TABLET | Freq: Three times a day (TID) | ORAL | Status: DC
  Administered 2012-02-13 – 2012-02-17 (×12): 650 mg via ORAL
  Filled 2012-02-13 (×15): qty 1

## 2012-02-13 MED ORDER — GLUCOSE 40 % PO GEL
ORAL | Status: AC
  Administered 2012-02-13: 37.5 g
  Filled 2012-02-13: qty 1

## 2012-02-13 MED ORDER — BOOST / RESOURCE BREEZE PO LIQD
1.0000 | Freq: Three times a day (TID) | ORAL | Status: DC
  Administered 2012-02-13 – 2012-02-15 (×7): 1 via ORAL

## 2012-02-13 MED ORDER — METOCLOPRAMIDE HCL 5 MG/ML IJ SOLN
5.0000 mg | Freq: Four times a day (QID) | INTRAMUSCULAR | Status: DC
  Administered 2012-02-13 – 2012-02-17 (×17): 5 mg via INTRAVENOUS
  Filled 2012-02-13 (×20): qty 1

## 2012-02-13 NOTE — Progress Notes (Signed)
Subjective:  Patient feels ok. Still has nausea, but no vomiting, diarrhea or abdominal pain. Afebrile.  Urine output is improving Getting D51/2NS at 100/hour Objective: Vital signs in last 24 hours: Filed Vitals:   02/12/12 1400 02/12/12 1800 02/12/12 2031 02/13/12 0517  BP: 99/45 89/56 105/66 93/53  Pulse: 83 83 87 91  Temp: 98.3 F (36.8 C) 98.5 F (36.9 C) 99 F (37.2 C) 99.3 F (37.4 C)  TempSrc: Oral Oral Oral Oral  Resp: 18 16 16 17   Height:   5' 3.89" (1.623 m)   Weight:   169 lb 15.6 oz (77.1 kg)   SpO2: 92% 90% 90% 91%   Weight change: 0 lb (0 kg)  Intake/Output Summary (Last 24 hours) at 02/13/12 0823 Last data filed at 02/13/12 0551  Gross per 24 hour  Intake   2765 ml  Output    100 ml  Net   2665 ml  I/O last 3 completed shifts: In: 4376.7 [P.O.:960; I.V.:3416.7] Out: 325 [Urine:325] Total I/O In: 40 [P.O.:40] Out: -  350 ml in urometer right now Body weight:  02/12/12 2031 169 lb 15.6 oz (77.1 kg) 02/11/12 2111 169 lb 15.6 oz (77.1 kg) 02/10/12 2128 168 lb 8 oz (76.431   Physical Exam:   Vitals: Tm: 99.3    HR:  91   BP: 85/47 to 93/53     RR:17  O2 saturation:  91  General: resting in bed, not in acute distress HEENT: PERRL, EOMI, no scleral icterus Cardiac: S1/S2, RRR, No murmurs, gallops or rubs Pulm: Decreased air movement at base bilaterally, No rales, wheezing, rhonchi or rubs. Abd: Distended, mild tender on LLQ, no rebound pain, no organomegaly, BS present; still soft (post tap) Ext: No rashes. Trace edema LE's, 2+DP/PT pulse bilaterally Musculoskeletal: No joint deformities, erythema, or stiffness, ROM full and nontender Skin: no rashes. No skin bruise. Neuro: alert and oriented X3, cranial nerves II-XII grossly intact, muscle strength 5/5 in all extremeties,  sensation to light touch intact.    Lab Results: Basic Metabolic Panel:  Lab 02/13/12 4098 02/12/12 0816 02/10/12 1740  NA 131* 135 --  K 4.0 4.7 --  CL 99 102 --  CO2 18*  18* --  GLUCOSE 117* 121* --  BUN 66* 68* --  CREATININE 7.29* 8.21* --  CALCIUM 7.4* 7.5* --  MG -- -- 1.7  PHOS -- 6.8* 7.5*    Lab 02/13/12 0525 02/12/12 0816 02/10/12 1740 02/09/12 1011  CREATININE 7.29* 8.21* 9.03* 8.78*    Liver Function Tests:  Lab 02/12/12 0816 02/09/12 1011  AST -- 9  ALT -- <8  ALKPHOS -- 74  BILITOT -- 0.2*  PROT -- 6.5  ALBUMIN 2.1* 3.4*   CBC:  Lab 02/10/12 1740  WBC 5.3  NEUTROABS --  HGB 10.7*  HCT 31.8*  MCV 86.4  PLT 407*   CBG:  Lab 02/13/12 0748 02/12/12 2034 02/12/12 1621 02/12/12 1132 02/12/12 0730 02/11/12 2115  GLUCAP 121* 107* 129* 142* 132* 122*   Hemoglobin A1C:  Lab 02/11/12 0016  HGBA1C 6.1*   Anemia Panel:  Lab 02/11/12 1952  VITAMINB12 206*  FOLATE 9.8  FERRITIN 105  TIBC 173*  IRON 23*  RETICCTPCT 0.8   Urinalysis:  Lab 02/10/12 1836  COLORURINE AMBER*  LABSPEC 1.032*  PHURINE 5.0  GLUCOSEU NEGATIVE  HGBUR TRACE*  BILIRUBINUR SMALL*  KETONESUR TRACE*  PROTEINUR NEGATIVE  UROBILINOGEN 0.2  NITRITE NEGATIVE  LEUKOCYTESUR NEGATIVE   02/12/12:  Urine sodium 39  Urine creatinine 352  Fractional sodium excretion 0.67%  Fractional urea excretion 6.7%  Micro Results: Recent Results (from the past 240 hour(s))  BODY FLUID CULTURE     Status: Normal (Preliminary result)   Collection Time   02/11/12  3:43 PM      Component Value Range Status Comment   Specimen Description FLUID ASCITIC   Final    Special Requests   Final    Gram Stain     Final    Value: RARE WBC PRESENT,BOTH PMN AND MONONUCLEAR     NO ORGANISMS SEEN   Culture NO GROWTH   Final    Report Status PENDING   Incomplete    Studies/Results: Ct Abdomen Pelvis Wo Contrast  02/11/2012  **ADDENDUM** CREATED: 02/11/2012 00:27:29  The patient has new renal failure. No evidence for hydronephrosis. There is a persistent nephrogram on this noncontrast exam, a finding which can be associated with contrast nephropathy.  The patient was  last given intravenous contrast on 02/01/2012.  **END ADDENDUM** SIGNED BY: Blair Hailey. Manson Passey, M.D.   02/10/2012  *RADIOLOGY REPORT*  Clinical Data: Abdominal pain.  Diarrhea, nausea.  Colonoscopy 07/16 showed a large ulceration in the ascending colon, pathology showing adenocarcinoma.  CT ABDOMEN AND PELVIS WITHOUT CONTRAST  Technique:  Multidetector CT imaging of the abdomen and pelvis was performed following the standard protocol without intravenous contrast.  Comparison: CT of the abdomen pelvis 02/01/2012  Findings: There are bilateral pleural effusions, left greater than right.  Bibasilar atelectasis/consolidation is present, increased since the prior study.  As identified previously, there is a large amount of ascites. Peritoneal nodules and omental stranding are again identified, consistent with carcinomatosis.  The liver shows no focal lesions.  No focal abnormality identified within the spleen, pancreas, or adrenal glands.  A hiatal hernia is present.  Small bowel loops show no obstruction although surrounded by mesenteric edema and ascites.  The gallbladder contains dependent, calcified gallstone.  Colonic loops are relatively collapsed.  Within the pelvis, solid and cystic adnexal masses are again noted, unchanged in appearance.  Uterine fibroids are present.  A Foley catheter decompresses the bladder.  There is diffuse body wall edema.  Degenerative changes are seen in the spine.  There is dense atherosclerotic calcification of the abdominal aorta but no aneurysm.  IMPRESSION:  1.  Large amount of ascites, peritoneal and omental disease, consistent with carcinomatosis. 2.  No evidence for bowel obstruction. 3.  Complex bilateral ovarian masses.  Differential diagnosis includes primary ovarian malignancy or ovarian metastases, which can originate from other sites such as stomach, pancreas, and colon primaries.  Original Report Authenticated By: Patterson Hammersmith, M.D.   Dg Chest 2 View  02/11/2012   *RADIOLOGY REPORT*  Clinical Data: Shortness of breath, cough, hypoxia.  CHEST - 2 VIEW  Comparison: None  Findings: Very low lung volumes with bibasilar atelectasis.  Small effusions present.  Heart size is borderline, accentuated by the low volumes.  Mild vascular congestion.  IMPRESSION: Very low lung volumes with bibasilar atelectasis and small effusions.  Original Report Authenticated By: Cyndie Chime, M.D.   Nm Pulmonary Perfusion  02/11/2012  *RADIOLOGY REPORT*  Clinical Data: Hypoxemia.  Cancer.  NM PULMONARY PERFUSION PARTICULATE  Radiopharmaceutical: CURIE MAA TECHNETIUM TO 70M ALBUMIN AGGREGATED  Comparison: Chest radiograph 02/11/2012.  Findings: There is mildly heterogeneous perfusion of the left lung, compatible with appearance on chest radiograph with thickening of the fissure and lower lobe atelectasis best demonstrated on the left lateral  projection.  Right lung appears well perfused.  There is volume loss at the left base.  IMPRESSION: Low probability pulmonary embolus.  Original Report Authenticated By: Andreas Newport, M.D.   US Renal  02/11/2012  *RADIOLOGY REPORT*  Clinical Data: Acute renal failure, abdominal ascites  RENAL/URINARY TRACT ULTRASOUND COMPLETE  Comparison:  02/10/2012 CT exam  Findings:  Right Kidney:  9.4 cm length.  Normal cortex and echogenicity.  No hydronephrosis or obstruction.  No focal abnormality.  Left Kidney:  10.1 cm length.  Limited visualization.  No hydronephrosis or definite focal abnormality.  Bladder:  Decompressed by Foley catheter  Abdominal ascites noted as well as bilateral pleural effusions.  IMPRESSION: No acute finding by ultrasound.  No hydronephrosis.  Original Report Authenticated By: Judie Petit. Ruel Favors, M.D.   US Paracentesis  02/11/2012  *RADIOLOGY REPORT*  Clinical Data: Abdominal discomfort, possible ovarian cancer, large volume ascites  ULTRASOUND GUIDED PARACENTESIS  Comparison:  None  An ultrasound guided paracentesis was thoroughly  discussed with the patient and questions answered.  The benefits, risks, alternatives and complications were also discussed.  The patient understands and wishes to proceed with the procedure.  Written consent was obtained.  Ultrasound was performed to localize and mark an adequate pocket of fluid in the right lower quadrant of the abdomen.  The area was then prepped and draped in the normal sterile fashion.  1% Lidocaine was used for local anesthesia.  Under ultrasound guidance a 19 gauge Yueh catheter was introduced.  Paracentesis was performed.  The catheter was removed and a dressing applied.  Complications:  None  Findings:  A total of approximately 5 liters of turbid dark yellow fluid was removed.  A fluid sample was sent for laboratory analysis.  IMPRESSION: Successful ultrasound guided paracentesis yielding 5 liters of ascites.  Read by Brayton El PA-C  Original Report Authenticated By: Richarda Overlie, M.D.   Medications: Scheduled Meds:    . atorvastatin  40 mg Oral Daily  . calcium acetate  1,334 mg Oral TID WC  . enoxaparin (LOVENOX) injection  30 mg Subcutaneous Q24H  . ezetimibe  10 mg Oral Daily  . feeding supplement  1 Container Oral TID BM  . insulin aspart  0-9 Units Subcutaneous TID WC  . iron polysaccharides  150 mg Oral Daily  . sodium chloride  3 mL Intravenous Q12H  . DISCONTD: aluminum hydroxide  30 mL Oral TID   Continuous Infusions:    . dextrose 5 % and 0.45% NaCl 100 mL/hr at 02/12/12 1800   PRN Meds:.HYDROcodone-acetaminophen, ondansetron, ondansetron, zolpidem   Assessment/Plan:  #: ARF: It is most likely multifactorial, including prerenal secondary to volume depletion due to nausea/vomiting/diarrhea, NSIADs, ACEI and IV contrast (7/22 with delayed nephrogram on imaging study of 7/31).  Background of HTN and DM-2. Creatinine was 1.48 on 7/8.  Currently patient's Creatinine is 7.21 down from 9 on admission.  Patient dose not seem to be fluid overloaded. Her lung  is clear at middle and upper lobes. Her chest x-ray showed very low lung volumes with bibasilar atelectasis and small effusions, which is most likely due to abdominal distension secondary to ascites. Patient has small amount leg edema. Not clinically uremic (N/V preced ARF), can  be due to ascites and abdominal tumors. Renal ultrasound did not show hydronephrosis. Creatinine improving with IV fluid.    FeNa 0.67% /FeUrea 6.7% - both compatible with pre-renal state - Continue IV:  dextrose 5 %-0.45 % sodium chloride infusion, 100 cc/h Change to  isotonic saline with lowish BP's and mild hyponatremia - d/c'd Lasix, Amlodipine, Benazepril, nsaids   #: Ovarian mass: CA 125 elevated. Oncology was consulted and is evaluating. Possible neoadjuvant chemotherapy, perhaps with single agent taxane, when renal situation allows.  #: Ascites: Paracentesis was performed. 5 L of slightly turbid dark yellow fluid was removed.  #: DM-2: On SSI and d/c Januvia  #: HTN: blood pressure is running low currently. D/c'd blood pressure medicaions.  #: Borderline hypoxemia: it is most likely due to deceased air movement secondary to severe ascitics. Since patient has possible malignancy which increased the possibility of PE. VQ scan was negative for PE.   #: Hyperphosphatemia:  Treat with calcium Acetate. Phosphorus 6.8 (8/2).  #: Anemia: Hgb was 10.7 on 02/10/12. It is likely due to possible malignant ovarian cancer and blood lose from colon given her large ulcer in colon. Anemia panel showed low iron and decreased TIBC and normal ferritin, indicating possible iron deficiency  -pending methylmalonic acid level. -Started iron supplement  Lorretta Harp, MD PGY2, Internal Medicine Teaching Service Pager: [phone] I have seen and examined this patient and agree with plan as outlined by Dr. Clyde Lundborg with highlighted additions.  Urine output is increasing, creatinine is (so far) dropping at about 1 mg% per day.  Will continue IVF,  change to isotonic fluids with lowish BP and dropping sodium on 1/2ns.  Add oral sodium bicarbonate for mild acidosis.  Optimistic that she will get further renal recovery - just not sure how much.  Will continue to observe, follow labs and exam. Caeson Filippi B,MD 02/13/2012 10:12 AM

## 2012-02-13 NOTE — Progress Notes (Signed)
Patient ID: Ann Baldwin  female  ZHY:[id number]    DOB: 1945-07-27    DOA: 02/10/2012  PCP: Allie Dimmer, OTR  Subjective:  C/o nausea and poor appetite, no vomiting, abdominal pain controlled, no chest pain, shortness of breath or any fevers.   Objective: Weight change: 0 kg (0 lb)  Intake/Output Summary (Last 24 hours) at 02/13/12 0949 Last data filed at 02/13/12 0900  Gross per 24 hour  Intake   2685 ml  Output    100 ml  Net   2585 ml   Blood pressure 96/49, pulse 85, temperature 98.5 F (36.9 C), temperature source Oral, resp. rate 16, height 5' 3.89" (1.623 m), weight 77.1 kg (169 lb 15.6 oz), SpO2 94.00%.  Physical Exam: General: Alert and awake, oriented x3, not in any acute distress. HEENT: anicteric sclera, pupils reactive to light and accommodation, EOMI CVS: S1-S2 clear, no murmur rubs or gallops Chest: Decreased breath sound at the bases. Abdomen: Soft, distention improved, NBS Extremities: no cyanosis, clubbing or edema noted bilaterally   Lab Results: Basic Metabolic Panel:  Lab 02/13/12 2952 02/12/12 0816 02/10/12 1740  NA 131* 135 --  K 4.0 4.7 --  CL 99 102 --  CO2 18* 18* --  GLUCOSE 117* 121* --  BUN 66* 68* --  CREATININE 7.29* 8.21* --  CALCIUM 7.4* 7.5* --  MG -- -- 1.7  PHOS -- 6.8* --   Liver Function Tests:  Lab 02/12/12 0816 02/09/12 1011  AST -- 9  ALT -- <8  ALKPHOS -- 74  BILITOT -- 0.2*  PROT -- 6.5  ALBUMIN 2.1* 3.4*   CBC:  Lab 02/10/12 1740  WBC 5.3  NEUTROABS --  HGB 10.7*  HCT 31.8*  MCV 86.4  PLT 407*   CBG:  Lab 02/13/12 0748 02/12/12 2034 02/12/12 1621 02/12/12 1132 02/12/12 0730  GLUCAP 121* 107* 129* 142* 132*     Micro Results: Recent Results (from the past 240 hour(s))  BODY FLUID CULTURE     Status: Normal (Preliminary result)   Collection Time   02/11/12  3:43 PM      Component Value Range Status Comment   Specimen Description FLUID ASCITIC   Final    Special Requests   Final    Gram Stain     Final    Value: RARE WBC PRESENT,BOTH PMN AND MONONUCLEAR     NO ORGANISMS SEEN   Culture NO GROWTH   Final    Report Status PENDING   Incomplete     Studies/Results: Ct Abdomen Pelvis Wo Contrast  02/11/2012  **ADDENDUM** CREATED: 02/11/2012 00:27:29  The patient has new renal failure. No evidence for hydronephrosis. There is a persistent nephrogram on this noncontrast exam, a finding which can be associated with contrast nephropathy.  The patient was last given intravenous contrast on 02/01/2012.  **END ADDENDUM** SIGNED BY: Blair Hailey. Manson Passey, M.D.   02/10/2012  *RADIOLOGY REPORT*  Clinical Data: Abdominal pain.  Diarrhea, nausea.  Colonoscopy 07/16 showed a large ulceration in the ascending colon, pathology showing adenocarcinoma.  CT ABDOMEN AND PELVIS WITHOUT CONTRAST  Technique:  Multidetector CT imaging of the abdomen and pelvis was performed following the standard protocol without intravenous contrast.  Comparison: CT of the abdomen pelvis 02/01/2012  Findings: There are bilateral pleural effusions, left greater than right.  Bibasilar atelectasis/consolidation is present, increased since the prior study.  As identified previously, there is a large amount of ascites. Peritoneal nodules and omental stranding are again  identified, consistent with carcinomatosis.  The liver shows no focal lesions.  No focal abnormality identified within the spleen, pancreas, or adrenal glands.  A hiatal hernia is present.  Small bowel loops show no obstruction although surrounded by mesenteric edema and ascites.  The gallbladder contains dependent, calcified gallstone.  Colonic loops are relatively collapsed.  Within the pelvis, solid and cystic adnexal masses are again noted, unchanged in appearance.  Uterine fibroids are present.  A Foley catheter decompresses the bladder.  There is diffuse body wall edema.  Degenerative changes are seen in the spine.  There is dense atherosclerotic calcification of the  abdominal aorta but no aneurysm.  IMPRESSION:  1.  Large amount of ascites, peritoneal and omental disease, consistent with carcinomatosis. 2.  No evidence for bowel obstruction. 3.  Complex bilateral ovarian masses.  Differential diagnosis includes primary ovarian malignancy or ovarian metastases, which can originate from other sites such as stomach, pancreas, and colon primaries.  Original Report Authenticated By: Patterson Hammersmith, M.D.   Dg Chest 2 View  02/11/2012  *RADIOLOGY REPORT*  Clinical Data: Shortness of breath, cough, hypoxia.  CHEST - 2 VIEW  Comparison: None  Findings: Very low lung volumes with bibasilar atelectasis.  Small effusions present.  Heart size is borderline, accentuated by the low volumes.  Mild vascular congestion.  IMPRESSION: Very low lung volumes with bibasilar atelectasis and small effusions.  Original Report Authenticated By: Cyndie Chime, M.D.   Ct Abdomen Pelvis W Contrast  02/01/2012  *RADIOLOGY REPORT*  Clinical Data:  Abdominal distention, malignant right colonic ulcer, suspected ascites, lower abdominal pain, nausea, question metastatic cancer  CT ABDOMEN AND PELVIS WITH CONTRAST  Technique:  Multidetector CT imaging of the abdomen and pelvis was performed following the standard protocol during bolus administration of intravenous contrast. Sagittal and coronal MPR images reconstructed from axial data set.  Contrast: 80mL OMNIPAQUE IOHEXOL 300 MG/ML  SOLN Dilute oral contrast.  Comparison: None  Findings: Subsegmental atelectasis bilateral lower lobes greater on left. Minimal left pleural effusion. Significant ascites. Scattered atherosclerotic calcifications aorta and coronary arteries. Cholelithiasis. Liver, spleen, pancreas, kidneys, adrenal glands normal appearance.  Enhancing 1.4 x 1.6 cm diameter probable leiomyoma within the uterus. Additional enhancing nodule adjacent to the uterine fundus question exophytic leiomyoma, 2.3 x 2.0 cm image 82. Complex solid and  cystic appearing mass in left adnexa, 5.0 x 6.2 cm suspicious for a cystic ovarian neoplasm. Additional right ovarian soft tissue mass 3.9 x 2.9 cm image 80. Enlarged right inguinal lymph node 2.5 x 1.5 cm image 88. Enlarged left periaortic lymph node 2.1 x 1.6 x 3.3 cm image 46.  Omental infiltration and soft tissue density consistent with omental tumor/caking. Additionally, slight nodular appearance to the undersurface of the right diaphragm question peritoneal tumor implants. Findings stranding is identified within the perihepatic ascites question adhesions, tumor strands, or septations within the ascitic fluid. Probable hiatal hernia. Remainder stomach decompressed. No definite colonic tumor identified. Redundant cecum in right upper quadrant. Appendix not definitely localized. No definite focal gastric or bowel abnormality otherwise identified. No definite acute osseous findings.  IMPRESSION: Bilateral ovarian masses, larger and more cystic on left, highly suspicious for ovarian neoplasms. Significant ascites with evidence of peritoneal carcinomatosis. Retroperitoneal and right inguinal adenopathy. Cholelithiasis. Hiatal hernia. No definite focal bowel lesion / mass evident by CT.  Original Report Authenticated By: Lollie Marrow, M.D.    Medications: Scheduled Meds:    . atorvastatin  40 mg Oral Daily  . calcium acetate  1,334 mg Oral TID WC  . enoxaparin (LOVENOX) injection  30 mg Subcutaneous Q24H  . ezetimibe  10 mg Oral Daily  . feeding supplement  1 Container Oral TID BM  . insulin aspart  0-9 Units Subcutaneous TID WC  . iron polysaccharides  150 mg Oral Daily  . sodium chloride  3 mL Intravenous Q12H  . DISCONTD: aluminum hydroxide  30 mL Oral TID   Continuous Infusions:    . dextrose 5 % and 0.45% NaCl 100 mL/hr at 02/12/12 1800     Assessment/Plan: Active Problems:  ARF (acute renal failure): Improving, no vomiting or any diarrhea, improving - Hold off on Lasix, NSAIDs, ACE  inhibitors, continue IV fluid - Renal ultrasound obtained, no hydronephrosis  - Will continue to follow renal recommendations, no plans of any hemodialysis yet.   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 Nelly Rout on August 8th. - Appreciate oncology recommendations, plans for chemotherapy after her renal function has normalized  Chronic nausea - continue Zofran, change to full liquid diet for today with nutritional supplements - Trial of Reglan   Diabetes mellitus: - Start on sliding scale insulin once placed on diet   Ascites: Status post paracentesis on 02/11/2012, 5 L removed. Cytology pending, culture so far negative. 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   Hypertension currently borderline hypotensive: - Hold off on any antihypertensives, continue gentle hydration   Hyperlipidemia: Continue Zetia and atorvastatin  DVT Prophylaxis: Lovenox  Code Status:  Disposition: Not medically ready   LOS: 3 days   Karry Causer M.D. [facility] 02/13/2012, 9:49 AM Pager: [phone]  If 7PM-7AM, please contact night-coverage [email or web address] Password [email or web address]

## 2012-02-13 NOTE — Progress Notes (Signed)
Subjective: Patient feels better this am. No new complaints.   Objective: Weight change: 0 lb (0 kg)  Intake/Output Summary (Last 24 hours) at 02/13/12 0958 Last data filed at 02/13/12 0900  Gross per 24 hour  Intake   2685 ml  Output    100 ml  Net   2585 ml   Blood pressure 96/49, pulse 85, temperature 98.5 F (36.9 C), temperature source Oral, resp. rate 16, height 5' 3.89" (1.623 m), weight 169 lb 15.6 oz (77.1 kg), SpO2 94.00%.  Physical Exam: General: Alert and awake, oriented x3, not in any acute distress. HEENT: anicteric sclera, pupils reactive to light and accommodation, EOMI CVS: S1-S2 clear, no murmur rubs or gallops Chest: Decreased breath sound at the bases. Abdomen: Soft, distention improved from yesterday, tenderness to palpation in the left lower quadrant Extremities: no cyanosis, clubbing or edema noted bilaterally Neuro: Cranial nerves II-XII intact, no focal neurological deficits  Lab Results: Basic Metabolic Panel:  Lab 02/13/12 0981 02/12/12 0816 02/10/12 1740  NA 131* 135 --  K 4.0 4.7 --  CL 99 102 --  CO2 18* 18* --  GLUCOSE 117* 121* --  BUN 66* 68* --  CREATININE 7.29* 8.21* --  CALCIUM 7.4* 7.5* --  MG -- -- 1.7  PHOS -- 6.8* --   Liver Function Tests:  Lab 02/12/12 0816 02/09/12 1011  AST -- 9  ALT -- <8  ALKPHOS -- 74  BILITOT -- 0.2*  PROT -- 6.5  ALBUMIN 2.1* 3.4*   CBC:  Lab 02/10/12 1740  WBC 5.3  NEUTROABS --  HGB 10.7*  HCT 31.8*  MCV 86.4  PLT 407*   CBG:  Lab 02/13/12 0748 02/12/12 2034 02/12/12 1621 02/12/12 1132 02/12/12 0730  GLUCAP 121* 107* 129* 142* 132*     Micro Results: Recent Results (from the past 240 hour(s))  BODY FLUID CULTURE     Status: Normal (Preliminary result)   Collection Time   02/11/12  3:43 PM      Component Value Range Status Comment   Specimen Description FLUID ASCITIC   Final    Special Requests   Final    Gram Stain     Final    Value: RARE WBC PRESENT,BOTH PMN AND  MONONUCLEAR     NO ORGANISMS SEEN   Culture NO GROWTH   Final    Report Status PENDING   Incomplete     Medications: Scheduled Meds:    . atorvastatin  40 mg Oral Daily  . calcium acetate  1,334 mg Oral TID WC  . enoxaparin (LOVENOX) injection  30 mg Subcutaneous Q24H  . ezetimibe  10 mg Oral Daily  . feeding supplement  1 Container Oral TID BM  . insulin aspart  0-9 Units Subcutaneous TID WC  . iron polysaccharides  150 mg Oral Daily  . sodium chloride  3 mL Intravenous Q12H  . DISCONTD: aluminum hydroxide  30 mL Oral TID   Continuous Infusions:    . dextrose 5 % and 0.45% NaCl 100 mL/hr at 02/12/12 1800     Assessment/Plan:  66 year old with:  1. ARF (acute renal failure): Only slight improvement.  2. 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 No active treatment for the time being given her ARF.    3. Ascites: Status post paracentesis on 02/11/2012, 5 L removed. Cytology pending, culture so far negative. - Likely malignant  due to newly diagnosed metastatic ovarian CA. Abdomin is less distended today.

## 2012-02-14 LAB — RENAL FUNCTION PANEL
Albumin: 1.7 g/dL — ABNORMAL LOW (ref 3.5–5.2)
Calcium: 7.5 mg/dL — ABNORMAL LOW (ref 8.4–10.5)
GFR calc Af Amer: 8 mL/min — ABNORMAL LOW (ref >90)
Glucose, Bld: 77 mg/dL (ref 70–99)
Phosphorus: 5 mg/dL — ABNORMAL HIGH (ref 2.3–4.6)
Potassium: 4.5 mEq/L (ref 3.5–5.1)
Sodium: 135 mEq/L (ref 135–145)

## 2012-02-14 LAB — GLUCOSE, CAPILLARY
Glucose-Capillary: 101 mg/dL — ABNORMAL HIGH (ref 70–99)
Glucose-Capillary: 112 mg/dL — ABNORMAL HIGH (ref 70–99)

## 2012-02-14 NOTE — Progress Notes (Signed)
Subjective: Patient notes feeling "pretty good" this morning.  She still feels nausea, but has been able to eat some full liquids.  No vomiting.  The patient continues to have good urine output and drop in creatinine.  She feels that she is not yet "ready" to have her foley taken out, due to weakness and difficulty transferring to bedside commode.  The patient's CO2 has improved mildly with PO bicarb added yesterday.  Objective: Vital signs in last 24 hours: Filed Vitals:   02/13/12 1400 02/13/12 1654 02/13/12 2033 02/14/12 0524  BP: 92/56 97/54 94/52  97/47  Pulse: 87 84 87 97  Temp: 98.8 F (37.1 C) 98.6 F (37 C) 98.6 F (37 C) 97.7 F (36.5 C)  TempSrc: Oral Oral Oral Oral  Resp: 17 16 16 16   Height:      Weight:   171 lb 15.3 oz (78 kg)   SpO2: 92% 92% 93% 93%   Weight change: 1 lb 15.8 oz (0.9 kg)  Intake/Output Summary (Last 24 hours) at 02/14/12 0953 Last data filed at 02/14/12 0601  Gross per 24 hour  Intake   2034 ml  Output   1425 ml  Net    609 ml  UOP: 1425  Body weight:  02/13/12 2033 171 lb 15.3 oz (78 kg) 02/12/12 2031 169 lb 15.6 oz (77.1 kg) 02/11/12 2111 169 lb 15.6 oz (77.1 kg) 02/10/12 2128 168 lb 8 oz (76.431   General: alert, resting in bed, and in no apparent distress HEENT: pupils equal round and reactive to light, vision grossly intact, oropharynx clear and non-erythematous  Neck: supple, no lymphadenopathy, no JVD Lungs: decreased air movement at bases bilaterally, normal work of respiration, no wheezes, rales, ronchi Heart: regular rate and rhythm, no murmurs, gallops, or rubs Abdomen: soft, mildly tender to palpation at LLQ, no guarding or rebound tenderness Extremities: trace LE edema Neurologic: alert & oriented X3, cranial nerves II-XII intact, strength grossly intact, sensation intact to light touch  Lab Results: Basic Metabolic Panel:  Lab 02/14/12 1610 02/13/12 0525 02/12/12 0816 02/10/12 1740  NA 135 131* -- --  K 4.5 4.0 -- --    CL 104 99 -- --  CO2 19 18* -- --  GLUCOSE 77 117* -- --  BUN 60* 66* -- --  CREATININE 5.92* 7.29* -- --  CALCIUM 7.5* 7.4* -- --  MG -- -- -- 1.7  PHOS 5.0* -- 6.8* --    Lab 02/14/12 0505 02/13/12 0525 02/12/12 0816 02/10/12 1740 02/09/12 1011  CREATININE 5.92* 7.29* 8.21* 9.03* 8.78*    Liver Function Tests:  Lab 02/14/12 0505 02/12/12 0816 02/09/12 1011  AST -- -- 9  ALT -- -- <8  ALKPHOS -- -- 74  BILITOT -- -- 0.2*  PROT -- -- 6.5  ALBUMIN 1.7* 2.1* --   CBC:  Lab 02/10/12 1740  WBC 5.3  NEUTROABS --  HGB 10.7*  HCT 31.8*  MCV 86.4  PLT 407*   CBG:  Lab 02/14/12 0745 02/13/12 2037 02/13/12 1747 02/13/12 1717 02/13/12 1649 02/13/12 1138  GLUCAP 89 104* 74 66* 65* 126*   Hemoglobin A1C:  Lab 02/11/12 0016  HGBA1C 6.1*   Anemia Panel:  Lab 02/11/12 1952  VITAMINB12 206*  FOLATE 9.8  FERRITIN 105  TIBC 173*  IRON 23*  RETICCTPCT 0.8  Saturation ratio: 13  Urinalysis:  Lab 02/10/12 1836  COLORURINE AMBER*  LABSPEC 1.032*  PHURINE 5.0  GLUCOSEU NEGATIVE  HGBUR TRACE*  BILIRUBINUR SMALL*  KETONESUR TRACE*  PROTEINUR NEGATIVE  UROBILINOGEN 0.2  NITRITE NEGATIVE  LEUKOCYTESUR NEGATIVE   02/12/12:  Urine sodium 39  Urine creatinine 352  Fractional sodium excretion 0.67%  Fractional urea excretion 6.7%  Micro Results: Recent Results (from the past 240 hour(s))  BODY FLUID CULTURE     Status: Normal (Preliminary result)   Collection Time   02/11/12  3:43 PM      Component Value Range Status Comment   Specimen Description FLUID ASCITIC   Final    Special Requests   Final    Gram Stain     Final    Value: RARE WBC PRESENT,BOTH PMN AND MONONUCLEAR     NO ORGANISMS SEEN   Culture NO GROWTH 1 DAY   Final    Report Status PENDING   Incomplete    Studies/Results: Ct Abdomen Pelvis Wo Contrast  02/11/2012  **ADDENDUM** CREATED: 02/11/2012 00:27:29  The patient has new renal failure. No evidence for hydronephrosis. There is a  persistent nephrogram on this noncontrast exam, a finding which can be associated with contrast nephropathy.  The patient was last given intravenous contrast on 02/01/2012.  **END ADDENDUM** SIGNED BY: Blair Hailey. Manson Passey, M.D.   02/10/2012  *RADIOLOGY REPORT*  Clinical Data: Abdominal pain.  Diarrhea, nausea.  Colonoscopy 07/16 showed a large ulceration in the ascending colon, pathology showing adenocarcinoma.  CT ABDOMEN AND PELVIS WITHOUT CONTRAST  Technique:  Multidetector CT imaging of the abdomen and pelvis was performed following the standard protocol without intravenous contrast.  Comparison: CT of the abdomen pelvis 02/01/2012  Findings: There are bilateral pleural effusions, left greater than right.  Bibasilar atelectasis/consolidation is present, increased since the prior study.  As identified previously, there is a large amount of ascites. Peritoneal nodules and omental stranding are again identified, consistent with carcinomatosis.  The liver shows no focal lesions.  No focal abnormality identified within the spleen, pancreas, or adrenal glands.  A hiatal hernia is present.  Small bowel loops show no obstruction although surrounded by mesenteric edema and ascites.  The gallbladder contains dependent, calcified gallstone.  Colonic loops are relatively collapsed.  Within the pelvis, solid and cystic adnexal masses are again noted, unchanged in appearance.  Uterine fibroids are present.  A Foley catheter decompresses the bladder.  There is diffuse body wall edema.  Degenerative changes are seen in the spine.  There is dense atherosclerotic calcification of the abdominal aorta but no aneurysm.  IMPRESSION:  1.  Large amount of ascites, peritoneal and omental disease, consistent with carcinomatosis. 2.  No evidence for bowel obstruction. 3.  Complex bilateral ovarian masses.  Differential diagnosis includes primary ovarian malignancy or ovarian metastases, which can originate from other sites such as  stomach, pancreas, and colon primaries.  Original Report Authenticated By: Patterson Hammersmith, M.D.   Dg Chest 2 View  02/11/2012  *RADIOLOGY REPORT*  Clinical Data: Shortness of breath, cough, hypoxia.  CHEST - 2 VIEW  Comparison: None  Findings: Very low lung volumes with bibasilar atelectasis.  Small effusions present.  Heart size is borderline, accentuated by the low volumes.  Mild vascular congestion.  IMPRESSION: Very low lung volumes with bibasilar atelectasis and small effusions.  Original Report Authenticated By: Cyndie Chime, M.D.   Nm Pulmonary Perfusion  02/11/2012  *RADIOLOGY REPORT*  Clinical Data: Hypoxemia.  Cancer.  NM PULMONARY PERFUSION PARTICULATE  Radiopharmaceutical: CURIE MAA TECHNETIUM TO 57M ALBUMIN AGGREGATED  Comparison: Chest radiograph 02/11/2012.  Findings: There is mildly heterogeneous perfusion of the left lung,  compatible with appearance on chest radiograph with thickening of the fissure and lower lobe atelectasis best demonstrated on the left lateral projection.  Right lung appears well perfused.  There is volume loss at the left base.  IMPRESSION: Low probability pulmonary embolus.  Original Report Authenticated By: Andreas Newport, M.D.   US Renal  02/11/2012  *RADIOLOGY REPORT*  Clinical Data: Acute renal failure, abdominal ascites  RENAL/URINARY TRACT ULTRASOUND COMPLETE  Comparison:  02/10/2012 CT exam  Findings:  Right Kidney:  9.4 cm length.  Normal cortex and echogenicity.  No hydronephrosis or obstruction.  No focal abnormality.  Left Kidney:  10.1 cm length.  Limited visualization.  No hydronephrosis or definite focal abnormality.  Bladder:  Decompressed by Foley catheter  Abdominal ascites noted as well as bilateral pleural effusions.  IMPRESSION: No acute finding by ultrasound.  No hydronephrosis.  Original Report Authenticated By: Judie Petit. Ruel Favors, M.D.   US Paracentesis  02/11/2012  *RADIOLOGY REPORT*  Clinical Data: Abdominal discomfort, possible  ovarian cancer, large volume ascites  ULTRASOUND GUIDED PARACENTESIS  Comparison:  None  An ultrasound guided paracentesis was thoroughly discussed with the patient and questions answered.  The benefits, risks, alternatives and complications were also discussed.  The patient understands and wishes to proceed with the procedure.  Written consent was obtained.  Ultrasound was performed to localize and mark an adequate pocket of fluid in the right lower quadrant of the abdomen.  The area was then prepped and draped in the normal sterile fashion.  1% Lidocaine was used for local anesthesia.  Under ultrasound guidance a 19 gauge Yueh catheter was introduced.  Paracentesis was performed.  The catheter was removed and a dressing applied.  Complications:  None  Findings:  A total of approximately 5 liters of turbid dark yellow fluid was removed.  A fluid sample was sent for laboratory analysis.  IMPRESSION: Successful ultrasound guided paracentesis yielding 5 liters of ascites.  Read by Brayton El PA-C  Original Report Authenticated By: Richarda Overlie, M.D.   Medications: Scheduled Meds:   . atorvastatin  40 mg Oral Daily  . calcium acetate  1,334 mg Oral TID WC  . dextrose      . enoxaparin (LOVENOX) injection  30 mg Subcutaneous Q24H  . ezetimibe  10 mg Oral Daily  . feeding supplement  1 Container Oral TID BM  . insulin aspart  0-9 Units Subcutaneous TID WC  . iron polysaccharides  150 mg Oral Daily  . metoCLOPramide (REGLAN) injection  5 mg Intravenous Q6H  . sodium bicarbonate  650 mg Oral TID  . sodium chloride  3 mL Intravenous Q12H   Continuous Infusions:   . sodium chloride 100 mL/hr at 02/13/12 1237  . DISCONTD: dextrose 5 % and 0.45% NaCl 100 mL/hr at 02/12/12 1800   PRN Meds:.HYDROcodone-acetaminophen, ondansetron, ondansetron, zolpidem   Assessment/Plan:  The patient is a 66 yo woman, history of DM2, HTN, HL, with chronic nausea/vomiting/abd pain secondary to adenocarcinoma, presenting  with worsening nausea/vomiting/abd pain, found to have a pre-renal ARF with Cr = 9.03 (Cr = 1.48 on 01/18/12), now downtrending.  #: ARF: It is most likely multifactorial, including prerenal secondary to volume depletion due to nausea/vomiting/diarrhea, NSIADs, ACEI and IV contrast (7/22 with delayed nephrogram on imaging study of 7/31).  Background of HTN and DM-2. Creatinine was 1.48 on 7/8.   Creatinine is now appropriately downtrending with IV fluids, without signs of volume overload.  Patient has small amount leg edema.  Not clinically uremic (N/V  preced ARF). Renal ultrasound did not show hydronephrosis.  FeNa 0.67% /FeUrea 6.7% - both compatible with pre-renal state - Continue IV fluids, NS at 100 cc/hr - could be decreased to 75/hour as pts po intake is improving - d/c'd Lasix, Amlodipine, Benazepril, nsaids   #: Ovarian mass: CA 125 elevated. Oncology was consulted and is evaluating. Possible neoadjuvant chemotherapy, perhaps with single agent taxane, when renal situation allows.  #: Ascites: Paracentesis was performed. 5 L of slightly turbid dark yellow fluid was removed.  #: DM-2: On SSI and d/c Januvia  #: Hypotension: blood pressure is running low currently. D/c'd blood pressure medicaions.  Concern for possible adrenal insufficiency. -IV fluids -check AM cortisol tomorrow  #: Borderline hypoxemia: it is most likely due to deceased air movement secondary to severe ascitics. Since patient has possible malignancy which increased the possibility of PE. VQ scan was negative for PE.   #: Hyperphosphatemia:  Treat with calcium Acetate. Phosphorus 6.8 (8/2), now improved to 5.0 (8/4)   #: Anemia: Hgb was 10.7 on 02/10/12. It is likely due to possible malignant ovarian cancer and blood loss from colon given her large ulcer in colon. Anemia panel showed low iron and decreased TIBC and normal ferritin, indicating possible iron deficiency -pending methylmalonic acid level. -continue niferex  150 mg daily for iron supplementation -  Signed, Janalyn Harder, PGY2 Pgr. [phone] 02/14/2012, 10:06 AM I have seen and examined this patient and agree with plan as outlined above by Janalyn Harder, MD.  Patient's renal function continues to improve, with creatinine falling about 1 mg% per day and non-oliguric. Will decrease IVF just a bit to 75/hour. BP remains soft despite IVF hydration with no BP meds on board.  Will check serum cortisol for completeness. Marleta Lapierre B,MD 02/14/2012 12:20 PM

## 2012-02-14 NOTE — Progress Notes (Signed)
Patient ID: Ann Baldwin  female  [id number]:[id number]    DOB: April 09, 1946    DOA: 02/10/2012  PCP: Allie Dimmer, OTR  Subjective:  Nausea improved today, no vomiting, abdominal pain controlled, no chest pain, shortness of breath or any fevers.   Objective: Weight change: 0.9 kg (1 lb 15.8 oz)  Intake/Output Summary (Last 24 hours) at 02/14/12 1023 Last data filed at 02/14/12 0601  Gross per 24 hour  Intake   2034 ml  Output   1425 ml  Net    609 ml   Blood pressure 97/47, pulse 97, temperature 97.7 F (36.5 C), temperature source Oral, resp. rate 16, height 5' 3.89" (1.623 m), weight 78 kg (171 lb 15.3 oz), SpO2 93.00%.  Physical Exam: General: Alert and awake, oriented x3, not in any acute distress. HEENT: anicteric sclera, pupils reactive to light and accommodation, EOMI CVS: S1-S2 clear, no murmur rubs or gallops Chest: Decreased breath sound at the bases. Abdomen: still Soft, distention  NBS Extremities: no cyanosis, clubbing or edema noted bilaterally   Lab Results: Basic Metabolic Panel:  Lab 02/14/12 5784 02/13/12 0525 02/10/12 1740  NA 135 131* --  K 4.5 4.0 --  CL 104 99 --  CO2 19 18* --  GLUCOSE 77 117* --  BUN 60* 66* --  CREATININE 5.92* 7.29* --  CALCIUM 7.5* 7.4* --  MG -- -- 1.7  PHOS 5.0* -- --   Liver Function Tests:  Lab 02/14/12 0505 02/12/12 0816 02/09/12 1011  AST -- -- 9  ALT -- -- <8  ALKPHOS -- -- 74  BILITOT -- -- 0.2*  PROT -- -- 6.5  ALBUMIN 1.7* 2.1* --   CBC:  Lab 02/10/12 1740  WBC 5.3  NEUTROABS --  HGB 10.7*  HCT 31.8*  MCV 86.4  PLT 407*   CBG:  Lab 02/14/12 0745 02/13/12 2037 02/13/12 1747 02/13/12 1717 02/13/12 1649  GLUCAP 89 104* 74 66* 65*     Micro Results: Recent Results (from the past 240 hour(s))  BODY FLUID CULTURE     Status: Normal (Preliminary result)   Collection Time   02/11/12  3:43 PM      Component Value Range Status Comment   Specimen Description FLUID ASCITIC   Final    Special  Requests   Final    Gram Stain     Final    Value: RARE WBC PRESENT,BOTH PMN AND MONONUCLEAR     NO ORGANISMS SEEN   Culture NO GROWTH 1 DAY   Final    Report Status PENDING   Incomplete     Studies/Results: Ct Abdomen Pelvis Wo Contrast  02/11/2012  **ADDENDUM** CREATED: 02/11/2012 00:27:29  The patient has new renal failure. No evidence for hydronephrosis. There is a persistent nephrogram on this noncontrast exam, a finding which can be associated with contrast nephropathy.  The patient was last given intravenous contrast on 02/01/2012.  **END ADDENDUM** SIGNED BY: Blair Hailey. Manson Passey, M.D.   02/10/2012  *RADIOLOGY REPORT*  Clinical Data: Abdominal pain.  Diarrhea, nausea.  Colonoscopy 07/16 showed a large ulceration in the ascending colon, pathology showing adenocarcinoma.  CT ABDOMEN AND PELVIS WITHOUT CONTRAST  Technique:  Multidetector CT imaging of the abdomen and pelvis was performed following the standard protocol without intravenous contrast.  Comparison: CT of the abdomen pelvis 02/01/2012  Findings: There are bilateral pleural effusions, left greater than right.  Bibasilar atelectasis/consolidation is present, increased since the prior study.  As identified previously, there is  a large amount of ascites. Peritoneal nodules and omental stranding are again identified, consistent with carcinomatosis.  The liver shows no focal lesions.  No focal abnormality identified within the spleen, pancreas, or adrenal glands.  A hiatal hernia is present.  Small bowel loops show no obstruction although surrounded by mesenteric edema and ascites.  The gallbladder contains dependent, calcified gallstone.  Colonic loops are relatively collapsed.  Within the pelvis, solid and cystic adnexal masses are again noted, unchanged in appearance.  Uterine fibroids are present.  A Foley catheter decompresses the bladder.  There is diffuse body wall edema.  Degenerative changes are seen in the spine.  There is dense  atherosclerotic calcification of the abdominal aorta but no aneurysm.  IMPRESSION:  1.  Large amount of ascites, peritoneal and omental disease, consistent with carcinomatosis. 2.  No evidence for bowel obstruction. 3.  Complex bilateral ovarian masses.  Differential diagnosis includes primary ovarian malignancy or ovarian metastases, which can originate from other sites such as stomach, pancreas, and colon primaries.  Original Report Authenticated By: Patterson Hammersmith, M.D.   Dg Chest 2 View  02/11/2012  *RADIOLOGY REPORT*  Clinical Data: Shortness of breath, cough, hypoxia.  CHEST - 2 VIEW  Comparison: None  Findings: Very low lung volumes with bibasilar atelectasis.  Small effusions present.  Heart size is borderline, accentuated by the low volumes.  Mild vascular congestion.  IMPRESSION: Very low lung volumes with bibasilar atelectasis and small effusions.  Original Report Authenticated By: Cyndie Chime, M.D.   Ct Abdomen Pelvis W Contrast  02/01/2012  *RADIOLOGY REPORT*  Clinical Data:  Abdominal distention, malignant right colonic ulcer, suspected ascites, lower abdominal pain, nausea, question metastatic cancer  CT ABDOMEN AND PELVIS WITH CONTRAST  Technique:  Multidetector CT imaging of the abdomen and pelvis was performed following the standard protocol during bolus administration of intravenous contrast. Sagittal and coronal MPR images reconstructed from axial data set.  Contrast: 80mL OMNIPAQUE IOHEXOL 300 MG/ML  SOLN Dilute oral contrast.  Comparison: None  Findings: Subsegmental atelectasis bilateral lower lobes greater on left. Minimal left pleural effusion. Significant ascites. Scattered atherosclerotic calcifications aorta and coronary arteries. Cholelithiasis. Liver, spleen, pancreas, kidneys, adrenal glands normal appearance.  Enhancing 1.4 x 1.6 cm diameter probable leiomyoma within the uterus. Additional enhancing nodule adjacent to the uterine fundus question exophytic leiomyoma, 2.3 x  2.0 cm image 82. Complex solid and cystic appearing mass in left adnexa, 5.0 x 6.2 cm suspicious for a cystic ovarian neoplasm. Additional right ovarian soft tissue mass 3.9 x 2.9 cm image 80. Enlarged right inguinal lymph node 2.5 x 1.5 cm image 88. Enlarged left periaortic lymph node 2.1 x 1.6 x 3.3 cm image 46.  Omental infiltration and soft tissue density consistent with omental tumor/caking. Additionally, slight nodular appearance to the undersurface of the right diaphragm question peritoneal tumor implants. Findings stranding is identified within the perihepatic ascites question adhesions, tumor strands, or septations within the ascitic fluid. Probable hiatal hernia. Remainder stomach decompressed. No definite colonic tumor identified. Redundant cecum in right upper quadrant. Appendix not definitely localized. No definite focal gastric or bowel abnormality otherwise identified. No definite acute osseous findings.  IMPRESSION: Bilateral ovarian masses, larger and more cystic on left, highly suspicious for ovarian neoplasms. Significant ascites with evidence of peritoneal carcinomatosis. Retroperitoneal and right inguinal adenopathy. Cholelithiasis. Hiatal hernia. No definite focal bowel lesion / mass evident by CT.  Original Report Authenticated By: Lollie Marrow, M.D.    Medications: Scheduled Meds:    .  atorvastatin  40 mg Oral Daily  . calcium acetate  1,334 mg Oral TID WC  . dextrose      . enoxaparin (LOVENOX) injection  30 mg Subcutaneous Q24H  . ezetimibe  10 mg Oral Daily  . feeding supplement  1 Container Oral TID BM  . insulin aspart  0-9 Units Subcutaneous TID WC  . iron polysaccharides  150 mg Oral Daily  . metoCLOPramide (REGLAN) injection  5 mg Intravenous Q6H  . sodium bicarbonate  650 mg Oral TID  . sodium chloride  3 mL Intravenous Q12H   Continuous Infusions:    . 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 Nelly Rout on August 8th and supportive care for now.  Chronic nausea: Somewhat improved after the scheduled Reglan - Continue current management, advance to regular diet   Diabetes mellitus: - Continue sliding scale insulin    Ascites: Status post paracentesis on 02/11/2012, 5 L removed. Cytology pending, culture so far negative. 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   Hypertension currently borderline hypotensive: - Hold off on any antihypertensives, continue gentle hydration   Hyperlipidemia: Continue Zetia and atorvastatin  DVT Prophylaxis: Lovenox  Code Status:  Disposition: Not medically ready   LOS: 4 days   Chasty Randal M.D. [facility] 02/14/2012, 10:23 AM Pager: [phone]  If 7PM-7AM, please contact night-coverage [email or web address] Password [email or web address]

## 2012-02-15 LAB — GLUCOSE, CAPILLARY
Glucose-Capillary: 85 mg/dL (ref 70–99)
Glucose-Capillary: 114 mg/dL — ABNORMAL HIGH (ref 70–99)
Glucose-Capillary: 120 mg/dL — ABNORMAL HIGH (ref 70–99)
Glucose-Capillary: 109 mg/dL — ABNORMAL HIGH (ref 70–99)

## 2012-02-15 LAB — RENAL FUNCTION PANEL
Albumin: 1.8 g/dL — ABNORMAL LOW (ref 3.5–5.2)
Albumin: 2 g/dL — ABNORMAL LOW (ref 3.5–5.2)
BUN: 48 mg/dL — ABNORMAL HIGH (ref 6–23)
CO2: 20 mEq/L (ref 19–32)
Chloride: 110 mEq/L (ref 96–112)
Chloride: 110 mEq/L (ref 96–112)
Creatinine, Ser: 3.85 mg/dL — ABNORMAL HIGH (ref 0.50–1.10)
GFR calc Af Amer: 14 mL/min — ABNORMAL LOW (ref >90)
GFR calc non Af Amer: 12 mL/min — ABNORMAL LOW (ref >90)
Glucose, Bld: 81 mg/dL (ref 70–99)
Phosphorus: 4 mg/dL (ref 2.3–4.6)
Potassium: 4.4 mEq/L (ref 3.5–5.1)
Potassium: 4.5 mEq/L (ref 3.5–5.1)
Sodium: 140 mEq/L (ref 135–145)

## 2012-02-15 LAB — BODY FLUID CULTURE

## 2012-02-15 LAB — CBC
HCT: 30 % — ABNORMAL LOW (ref 36.0–46.0)
Hemoglobin: 9.9 g/dL — ABNORMAL LOW (ref 12.0–15.0)
MCH: 29.1 pg (ref 26.0–34.0)
MCHC: 33 g/dL (ref 30.0–36.0)
RDW: 16.1 % — ABNORMAL HIGH (ref 11.5–15.5)

## 2012-02-15 MED ORDER — ONDANSETRON HCL 4 MG/2ML IJ SOLN
4.0000 mg | INTRAMUSCULAR | Status: DC | PRN
  Administered 2012-02-15 – 2012-02-16 (×2): 4 mg via INTRAVENOUS
  Filled 2012-02-15: qty 2

## 2012-02-15 NOTE — Progress Notes (Signed)
Patient ID: Ann Baldwin  female  YNW:[id number]    DOB: May 20, 1946    DOA: 02/10/2012  PCP: Allie Dimmer, OTR  Subjective:  Feels a whole lot better today, nausea improving, and denies any abdominal pain, chest pain or shortness of breath. Tolerating diet.  Objective: Weight change: 0.6 kg (1 lb 5.2 oz)  Intake/Output Summary (Last 24 hours) at 02/15/12 1022 Last data filed at 02/15/12 0900  Gross per 24 hour  Intake    600 ml  Output   2600 ml  Net  -2000 ml   Blood pressure 117/55, pulse 92, temperature 98 F (36.7 C), temperature source Oral, resp. rate 20, height 5' 3.89" (1.623 m), weight 78.6 kg (173 lb 4.5 oz), SpO2 98.00%.  Physical Exam: General: Alert and awake, oriented x3, not in any acute distress. HEENT: anicteric sclera, pupils reactive to light and accommodation, EOMI CVS: S1-S2 clear, no murmur rubs or gallops Chest: Decreased breath sound at the bases. Abdomen: still Soft, distention, nontender  NBS Extremities: no cyanosis, clubbing or edema noted bilaterally   Lab Results: Basic Metabolic Panel:  Lab 02/15/12 6578 02/15/12 0515 02/10/12 1740  NA 140 139 --  K 4.5 4.4 --  CL 110 110 --  CO2 21 20 --  GLUCOSE 113* 81 --  BUN 46* 48* --  CREATININE 3.55* 3.85* --  CALCIUM 8.4 8.3* --  MG -- -- 1.7  PHOS 4.0 -- --   Liver Function Tests:  Lab 02/15/12 0857 02/15/12 0515 02/09/12 1011  AST -- -- 9  ALT -- -- <8  ALKPHOS -- -- 74  BILITOT -- -- 0.2*  PROT -- -- 6.5  ALBUMIN 2.0* 1.8* --   CBC:  Lab 02/15/12 0515 02/10/12 1740  WBC 3.5* 5.3  NEUTROABS -- --  HGB 9.9* 10.7*  HCT 30.0* 31.8*  MCV 88.2 86.4  PLT 329 407*   CBG:  Lab 02/15/12 0723 02/14/12 2040 02/14/12 1644 02/14/12 1143 02/14/12 0745  GLUCAP 85 112* 112* 101* 89     Micro Results: Recent Results (from the past 240 hour(s))  BODY FLUID CULTURE     Status: Normal (Preliminary result)   Collection Time   02/11/12  3:43 PM      Component Value Range Status  Comment   Specimen Description FLUID ASCITIC   Final    Special Requests   Final    Gram Stain     Final    Value: RARE WBC PRESENT,BOTH PMN AND MONONUCLEAR     NO ORGANISMS SEEN   Culture NO GROWTH 2 DAYS   Final    Report Status PENDING   Incomplete     Studies/Results: Ct Abdomen Pelvis Wo Contrast  02/11/2012  **ADDENDUM** CREATED: 02/11/2012 00:27:29  The patient has new renal failure. No evidence for hydronephrosis. There is a persistent nephrogram on this noncontrast exam, a finding which can be associated with contrast nephropathy.  The patient was last given intravenous contrast on 02/01/2012.  **END ADDENDUM** SIGNED BY: Blair Hailey. Manson Passey, M.D.   02/10/2012  *RADIOLOGY REPORT*  Clinical Data: Abdominal pain.  Diarrhea, nausea.  Colonoscopy 07/16 showed a large ulceration in the ascending colon, pathology showing adenocarcinoma.  CT ABDOMEN AND PELVIS WITHOUT CONTRAST  Technique:  Multidetector CT imaging of the abdomen and pelvis was performed following the standard protocol without intravenous contrast.  Comparison: CT of the abdomen pelvis 02/01/2012  Findings: There are bilateral pleural effusions, left greater than right.  Bibasilar atelectasis/consolidation is present,  increased since the prior study.  As identified previously, there is a large amount of ascites. Peritoneal nodules and omental stranding are again identified, consistent with carcinomatosis.  The liver shows no focal lesions.  No focal abnormality identified within the spleen, pancreas, or adrenal glands.  A hiatal hernia is present.  Small bowel loops show no obstruction although surrounded by mesenteric edema and ascites.  The gallbladder contains dependent, calcified gallstone.  Colonic loops are relatively collapsed.  Within the pelvis, solid and cystic adnexal masses are again noted, unchanged in appearance.  Uterine fibroids are present.  A Foley catheter decompresses the bladder.  There is diffuse body wall edema.   Degenerative changes are seen in the spine.  There is dense atherosclerotic calcification of the abdominal aorta but no aneurysm.  IMPRESSION:  1.  Large amount of ascites, peritoneal and omental disease, consistent with carcinomatosis. 2.  No evidence for bowel obstruction. 3.  Complex bilateral ovarian masses.  Differential diagnosis includes primary ovarian malignancy or ovarian metastases, which can originate from other sites such as stomach, pancreas, and colon primaries.  Original Report Authenticated By: Patterson Hammersmith, M.D.   Dg Chest 2 View  02/11/2012  *RADIOLOGY REPORT*  Clinical Data: Shortness of breath, cough, hypoxia.  CHEST - 2 VIEW  Comparison: None  Findings: Very low lung volumes with bibasilar atelectasis.  Small effusions present.  Heart size is borderline, accentuated by the low volumes.  Mild vascular congestion.  IMPRESSION: Very low lung volumes with bibasilar atelectasis and small effusions.  Original Report Authenticated By: Cyndie Chime, M.D.   Ct Abdomen Pelvis W Contrast  02/01/2012  *RADIOLOGY REPORT*  Clinical Data:  Abdominal distention, malignant right colonic ulcer, suspected ascites, lower abdominal pain, nausea, question metastatic cancer  CT ABDOMEN AND PELVIS WITH CONTRAST  Technique:  Multidetector CT imaging of the abdomen and pelvis was performed following the standard protocol during bolus administration of intravenous contrast. Sagittal and coronal MPR images reconstructed from axial data set.  Contrast: 80mL OMNIPAQUE IOHEXOL 300 MG/ML  SOLN Dilute oral contrast.  Comparison: None  Findings: Subsegmental atelectasis bilateral lower lobes greater on left. Minimal left pleural effusion. Significant ascites. Scattered atherosclerotic calcifications aorta and coronary arteries. Cholelithiasis. Liver, spleen, pancreas, kidneys, adrenal glands normal appearance.  Enhancing 1.4 x 1.6 cm diameter probable leiomyoma within the uterus. Additional enhancing nodule  adjacent to the uterine fundus question exophytic leiomyoma, 2.3 x 2.0 cm image 82. Complex solid and cystic appearing mass in left adnexa, 5.0 x 6.2 cm suspicious for a cystic ovarian neoplasm. Additional right ovarian soft tissue mass 3.9 x 2.9 cm image 80. Enlarged right inguinal lymph node 2.5 x 1.5 cm image 88. Enlarged left periaortic lymph node 2.1 x 1.6 x 3.3 cm image 46.  Omental infiltration and soft tissue density consistent with omental tumor/caking. Additionally, slight nodular appearance to the undersurface of the right diaphragm question peritoneal tumor implants. Findings stranding is identified within the perihepatic ascites question adhesions, tumor strands, or septations within the ascitic fluid. Probable hiatal hernia. Remainder stomach decompressed. No definite colonic tumor identified. Redundant cecum in right upper quadrant. Appendix not definitely localized. No definite focal gastric or bowel abnormality otherwise identified. No definite acute osseous findings.  IMPRESSION: Bilateral ovarian masses, larger and more cystic on left, highly suspicious for ovarian neoplasms. Significant ascites with evidence of peritoneal carcinomatosis. Retroperitoneal and right inguinal adenopathy. Cholelithiasis. Hiatal hernia. No definite focal bowel lesion / mass evident by CT.  Original Report Authenticated By: Loraine Leriche  A. Tyron Russell, M.D.    Medications: Scheduled Meds:    . atorvastatin  40 mg Oral Daily  . enoxaparin (LOVENOX) injection  30 mg Subcutaneous Q24H  . ezetimibe  10 mg Oral Daily  . feeding supplement  1 Container Oral TID BM  . insulin aspart  0-9 Units Subcutaneous TID WC  . iron polysaccharides  150 mg Oral Daily  . metoCLOPramide (REGLAN) injection  5 mg Intravenous Q6H  . sodium bicarbonate  650 mg Oral TID  . sodium chloride  3 mL Intravenous Q12H  . DISCONTD: calcium acetate  1,334 mg Oral TID WC   Continuous Infusions:    . 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 Nelly Rout on August 8th and supportive care for now.  Chronic nausea: Somewhat improved after the scheduled Reglan - Continue current management, patient tolerating regular diet   Diabetes mellitus: Controlled now - Continue sliding scale insulin    Ascites: Status post paracentesis on 02/11/2012, 5 L removed. Cytology pending, culture so far negative. 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  Hypotension: BP stable now - Hold off on any antihypertensives, continue gentle hydration   Hyperlipidemia: Continue Zetia and atorvastatin  DVT Prophylaxis: Lovenox  Code Status:  Disposition: Not medically ready. Patient feels much stronger now, will start physical therapy.    LOS: 5 days   Cherry Wittwer M.D. [facility] 02/15/2012, 10:22 AM Pager: [phone]  If 7PM-7AM, please contact night-coverage [email or web address] Password [email or web address]

## 2012-02-15 NOTE — Progress Notes (Signed)
Subjective:  Patient notes feeling OK. No nausea or vomiting, but feels uncomfortable in abdomen. Had bowel movement.    Objective: Vital signs in last 24 hours: Filed Vitals:   02/14/12 1300 02/14/12 1646 02/14/12 2044 02/15/12 0434  BP: 111/57 111/61 111/56 113/57  Pulse: 85 88 93 88  Temp: 98.1 F (36.7 C) 98.5 F (36.9 C) 98.9 F (37.2 C) 98.4 F (36.9 C)  TempSrc: Oral Oral Oral Oral  Resp: 16 17 18 18   Height:      Weight:   173 lb 4.5 oz (78.6 kg)   SpO2: 95% 95% 94% 94%   Weight change: 1 lb 5.2 oz (0.6 kg)  Intake/Output Summary (Last 24 hours) at 02/15/12 0818 Last data filed at 02/15/12 0430  Gross per 24 hour  Intake    480 ml  Output   2600 ml  Net  -2120 ml  UOP: 1425 (8/4); 720 (8/5)  Body weight:  02/15/12 02/14/12 2044 173 lb 4.5 oz (78.6 kg) 02/13/12 2033 171 lb 15.3 oz (78 kg) 02/12/12 2031 169 lb 15.6 oz (77.1 kg) 02/11/12 2111 169 lb 15.6 oz (77.1 kg) 02/10/12 2128 168 lb 8 oz (76.431    Vitals: T: 98.9   HR:88     BP:113/57     RR:18     O2 saturation:  94  General: resting in bed, not in acute distress HEENT: PERRL, EOMI, no scleral icterus Cardiac: S1/S2, RRR, No murmurs, gallops or rubs Pulm: decreased air movement bilaterally, Clear to auscultation bilaterally, No rales, wheezing, rhonchi or rubs. Abd: Soft,  Mildly distended, mild tender over lower quadrant, no rebound pain, no organomegaly, BS present Ext: 1+ edema, 2+DP/PT pulse bilaterally Musculoskeletal: No joint deformities, erythema, or stiffness, ROM full and no nontender Skin: no rashes. No skin bruise. Neuro: alert and oriented X3, cranial nerves II-XII grossly intact, muscle strength 5/5 in all extremeties,  sensation to light touch intact.  Lab Results: Basic Metabolic Panel:  Lab 02/15/12 1610 02/14/12 0505 02/10/12 1740  NA 139 135 --  K 4.4 4.5 --  CL 110 104 --  CO2 20 19 --  GLUCOSE 81 77 --  BUN 48* 60* --  CREATININE 3.85* 5.92* --  CALCIUM 8.3* 7.5* --  MG  -- -- 1.7  PHOS 4.2 5.0* --    Lab 02/15/12 0515 02/14/12 0505 02/13/12 0525 02/12/12 0816 02/10/12 1740  CREATININE 3.85* 5.92* 7.29* 8.21* 9.03*    Liver Function Tests:  Lab 02/15/12 0515 02/14/12 0505 02/09/12 1011  AST -- -- 9  ALT -- -- <8  ALKPHOS -- -- 74  BILITOT -- -- 0.2*  PROT -- -- 6.5  ALBUMIN 1.8* 1.7* --   CBC:  Lab 02/15/12 0515 02/10/12 1740  WBC 3.5* 5.3  NEUTROABS -- --  HGB 9.9* 10.7*  HCT 30.0* 31.8*  MCV 88.2 86.4  PLT 329 407*   CBG:  Lab 02/15/12 0723 02/14/12 2040 02/14/12 1644 02/14/12 1143 02/14/12 0745 02/13/12 2037  GLUCAP 85 112* 112* 101* 89 104*   Hemoglobin A1C:  Lab 02/11/12 0016  HGBA1C 6.1*   Anemia Panel:  Lab 02/11/12 1952  VITAMINB12 206*  FOLATE 9.8  FERRITIN 105  TIBC 173*  IRON 23*  RETICCTPCT 0.8  Saturation ratio: 13  Urinalysis:  Lab 02/10/12 1836  COLORURINE AMBER*  LABSPEC 1.032*  PHURINE 5.0  GLUCOSEU NEGATIVE  HGBUR TRACE*  BILIRUBINUR SMALL*  KETONESUR TRACE*  PROTEINUR NEGATIVE  UROBILINOGEN 0.2  NITRITE NEGATIVE  LEUKOCYTESUR NEGATIVE  02/12/12:  Urine sodium 39  Urine creatinine 352  Fractional sodium excretion 0.67%  Fractional urea excretion 6.7%  Micro Results: Recent Results (from the past 240 hour(s))  BODY FLUID CULTURE     Status: Normal (Preliminary result)   Collection Time   02/11/12  3:43 PM      Component Value Range Status Comment   Specimen Description FLUID ASCITIC   Final    Special Requests   Final    Gram Stain     Final    Value: RARE WBC PRESENT,BOTH PMN AND MONONUCLEAR     NO ORGANISMS SEEN   Culture NO GROWTH 2 DAYS   Final    Report Status PENDING   Incomplete    Studies/Results: Ct Abdomen Pelvis Wo Contrast  02/11/2012  **ADDENDUM** CREATED: 02/11/2012 00:27:29  The patient has new renal failure. No evidence for hydronephrosis. There is a persistent nephrogram on this noncontrast exam, a finding which can be associated with contrast nephropathy.  The  patient was last given intravenous contrast on 02/01/2012.  **END ADDENDUM** SIGNED BY: Blair Hailey. Manson Passey, M.D.   02/10/2012  *RADIOLOGY REPORT*  Clinical Data: Abdominal pain.  Diarrhea, nausea.  Colonoscopy 07/16 showed a large ulceration in the ascending colon, pathology showing adenocarcinoma.  CT ABDOMEN AND PELVIS WITHOUT CONTRAST  Technique:  Multidetector CT imaging of the abdomen and pelvis was performed following the standard protocol without intravenous contrast.  Comparison: CT of the abdomen pelvis 02/01/2012  Findings: There are bilateral pleural effusions, left greater than right.  Bibasilar atelectasis/consolidation is present, increased since the prior study.  As identified previously, there is a large amount of ascites. Peritoneal nodules and omental stranding are again identified, consistent with carcinomatosis.  The liver shows no focal lesions.  No focal abnormality identified within the spleen, pancreas, or adrenal glands.  A hiatal hernia is present.  Small bowel loops show no obstruction although surrounded by mesenteric edema and ascites.  The gallbladder contains dependent, calcified gallstone.  Colonic loops are relatively collapsed.  Within the pelvis, solid and cystic adnexal masses are again noted, unchanged in appearance.  Uterine fibroids are present.  A Foley catheter decompresses the bladder.  There is diffuse body wall edema.  Degenerative changes are seen in the spine.  There is dense atherosclerotic calcification of the abdominal aorta but no aneurysm.  IMPRESSION:  1.  Large amount of ascites, peritoneal and omental disease, consistent with carcinomatosis. 2.  No evidence for bowel obstruction. 3.  Complex bilateral ovarian masses.  Differential diagnosis includes primary ovarian malignancy or ovarian metastases, which can originate from other sites such as stomach, pancreas, and colon primaries.  Original Report Authenticated By: Patterson Hammersmith, M.D.   Dg Chest 2  View  02/11/2012  *RADIOLOGY REPORT*  Clinical Data: Shortness of breath, cough, hypoxia.  CHEST - 2 VIEW  Comparison: None  Findings: Very low lung volumes with bibasilar atelectasis.  Small effusions present.  Heart size is borderline, accentuated by the low volumes.  Mild vascular congestion.  IMPRESSION: Very low lung volumes with bibasilar atelectasis and small effusions.  Original Report Authenticated By: Cyndie Chime, M.D.   Nm Pulmonary Perfusion  02/11/2012  *RADIOLOGY REPORT*  Clinical Data: Hypoxemia.  Cancer.  NM PULMONARY PERFUSION PARTICULATE  Radiopharmaceutical: CURIE MAA TECHNETIUM TO 87M ALBUMIN AGGREGATED  Comparison: Chest radiograph 02/11/2012.  Findings: There is mildly heterogeneous perfusion of the left lung, compatible with appearance on chest radiograph with thickening of the fissure and lower  lobe atelectasis best demonstrated on the left lateral projection.  Right lung appears well perfused.  There is volume loss at the left base.  IMPRESSION: Low probability pulmonary embolus.  Original Report Authenticated By: Andreas Newport, M.D.   US Renal  02/11/2012  *RADIOLOGY REPORT*  Clinical Data: Acute renal failure, abdominal ascites  RENAL/URINARY TRACT ULTRASOUND COMPLETE  Comparison:  02/10/2012 CT exam  Findings:  Right Kidney:  9.4 cm length.  Normal cortex and echogenicity.  No hydronephrosis or obstruction.  No focal abnormality.  Left Kidney:  10.1 cm length.  Limited visualization.  No hydronephrosis or definite focal abnormality.  Bladder:  Decompressed by Foley catheter  Abdominal ascites noted as well as bilateral pleural effusions.  IMPRESSION: No acute finding by ultrasound.  No hydronephrosis.  Original Report Authenticated By: Judie Petit. Ruel Favors, M.D.   US Paracentesis  02/11/2012  *RADIOLOGY REPORT*  Clinical Data: Abdominal discomfort, possible ovarian cancer, large volume ascites  ULTRASOUND GUIDED PARACENTESIS  Comparison:  None  An ultrasound guided  paracentesis was thoroughly discussed with the patient and questions answered.  The benefits, risks, alternatives and complications were also discussed.  The patient understands and wishes to proceed with the procedure.  Written consent was obtained.  Ultrasound was performed to localize and mark an adequate pocket of fluid in the right lower quadrant of the abdomen.  The area was then prepped and draped in the normal sterile fashion.  1% Lidocaine was used for local anesthesia.  Under ultrasound guidance a 19 gauge Yueh catheter was introduced.  Paracentesis was performed.  The catheter was removed and a dressing applied.  Complications:  None  Findings:  A total of approximately 5 liters of turbid dark yellow fluid was removed.  A fluid sample was sent for laboratory analysis.  IMPRESSION: Successful ultrasound guided paracentesis yielding 5 liters of ascites.  Read by Brayton El PA-C  Original Report Authenticated By: Richarda Overlie, M.D.   Medications: Scheduled Meds:    . atorvastatin  40 mg Oral Daily  . calcium acetate  1,334 mg Oral TID WC  . enoxaparin (LOVENOX) injection  30 mg Subcutaneous Q24H  . ezetimibe  10 mg Oral Daily  . feeding supplement  1 Container Oral TID BM  . insulin aspart  0-9 Units Subcutaneous TID WC  . iron polysaccharides  150 mg Oral Daily  . metoCLOPramide (REGLAN) injection  5 mg Intravenous Q6H  . sodium bicarbonate  650 mg Oral TID  . sodium chloride  3 mL Intravenous Q12H   Continuous Infusions:    . sodium chloride 75 mL/hr at 02/15/12 0146   PRN Meds:.HYDROcodone-acetaminophen, ondansetron, ondansetron, zolpidem   Assessment/Plan:  The patient is a 66 yo woman, history of DM2, HTN, HL, with chronic nausea/vomiting/abd pain secondary to adenocarcinoma, presenting with worsening nausea/vomiting/abd pain, found to have a pre-renal ARF with Cr = 9.03 (Cr = 1.48 on 01/18/12), now downtrending.  #: ARF: It is most likely multifactorial, including prerenal  secondary to volume depletion due to nausea/vomiting/diarrhea, NSIADs, ACEI and IV contrast (7/22 with delayed nephrogram on imaging study of 7/31).  Background of HTN and DM-2. Creatinine was 1.48 on 7/8.    - Creatinine is now appropriately downtrending with IV fluids, without signs of volume overload.  Patient has small amount leg edema.  -Not clinically uremic (N/V preced ARF). Renal ultrasound did not show hydronephrosis.  FeNa 0.67% /FeUrea 6.7% - both compatible with pre-renal state.   - Continue IV fluids, NS at 75 cc/hr -  -  d/c'd Lasix, Amlodipine, Benazepril, nsaids   #: Ovarian mass: CA 125 elevated. Oncology was consulted and is evaluating. Possible neoadjuvant chemotherapy, perhaps with single agent taxane, when renal situation allows.  #: Ascites: Paracentesis was performed on admission. 5 L of slightly turbid dark yellow fluid was removed.  #: DM-2: On SSI and d/c Januvia  #: Hypotension: blood pressure is running low currently. D/c'd blood pressure medicaions.  Concern for possible adrenal insufficiency.  -IV fluids -pending cortisol level  #: Borderline hypoxemia: improving. O2 Sat is 94% today.  it is most likely due to deceased air movement secondary to severe ascitics. Since patient has possible malignancy which increased the possibility of PE. VQ scan was negative for PE.   #: Hyperphosphatemia:  Treated with calcium Acetate. Phosphorus 6.8 (8/2), normal today 4.2 (8/5).  -d/c calcium Acetate.   #: Anemia: Hgb was 10.7 on 02/10/12. It is likely due to possible malignant ovarian cancer and blood loss from colon given her large ulcer in colon. Anemia panel showed low iron and decreased TIBC and normal ferritin, indicating possible iron deficiency  -pending methylmalonic acid level. -continue niferex 150 mg daily for iron supplementation  Lorretta Harp, MD PGY2, Internal Medicine Teaching Service Pager: [phone] I have seen and examined this patient and agree with plan  .  SCr trending down with good UO.  Anticipate renal fx to cont to improve to baseline.  Will sign off but please call back if further renal issues.  DC foley. Autrey Human T,MD 02/15/2012 11:27 AM

## 2012-02-15 NOTE — Progress Notes (Signed)
Utilization review completed.  

## 2012-02-15 NOTE — Progress Notes (Signed)
PT Cancellation Note  Treatment cancelled today due to patient's refusal to participate, pt not feeling well and reports that she has been up to bathroom several times.  Encouraged her in mobility and to participate with PT tomorrow. Lyanne Co, PT  Acute Rehab Services  [phone]   Lyanne Co 02/15/2012, 3:38 PM

## 2012-02-15 NOTE — Progress Notes (Signed)
  02/15/2012, 5:18 PM  Hospital day: 6  Chemotherapy: none    Subjective: Awake and alert, in bed with O2 on. Feeling a little better today. Has eaten only bites off of tray now. Has been up in chair several times today. Not uncomfortable from recurrent ascites. Denies shortness of breath or pain. Thinks she may not need O2. Objective: Vital signs in last 24 hours: Blood pressure 130/73, pulse 103, temperature 98.8 F (37.1 C), temperature source Oral, resp. rate 18, height 5' 3.89" (1.623 m), weight 173 lb 4.5 oz (78.6 kg), SpO2 97.00%. PERRL. IV left antecubital site ok. Lungs clear anteriorly. Heart RRR. Abdomen with increased ascites over my exam 02-12-12 but not tight, not tender. LE without cords, tenderness. Moves all extremities easily.  Intake/Output from previous day: 08/04 0701 - 08/05 0700 In: 720 [P.O.:720] Out: 2600 [Urine:2600] Intake/Output this shift: Total I/O In: 990 [P.O.:240; I.V.:750] Out: 400 [Urine:400] IV at 75   Lab Results:  Basename 02/15/12 0515  WBC 3.5*  HGB 9.9*  HCT 30.0*  PLT 329   BMET  Basename 02/15/12 0857 02/15/12 0515  NA 140 139  K 4.5 4.4  CL 110 110  CO2 21 20  GLUCOSE 113* 81  BUN 46* 48*  CREATININE 3.55* 3.85*  CALCIUM 8.4 8.3*    Studies/Results: No results found.   Assessment/Plan: 1.Acute renal failure, multifactorial including prerenal from N/V/diarrhea, third spacing with ascites, also medications and IV contrast + underlying HTN and DM. Creatinine continues to improve, nonoliguric. Still on IVF. 2.extensive gyn carcinoma recently diagnosed, with malignant ascites.  She is scheduled back to Dr Laurette Schimke gyn oncology at [facility] on 02-18-12 if out of hospital by then. Chemotherapy likely to begin after renal function improves as much as possible, tho will not be able to use platin drugs due to renal constraints. She may need another paracentesis in 2-3 days/ prior to DC, but I would not put  indwelling drain as this may improve significantly with chemotherapy. 3.if peripheral IV access is difficult, please have PAC placed by IR this admission, to use for chemotherapy.  Please call if questions. I will round again later this week.  Patrici Minnis P [phone]

## 2012-02-15 NOTE — Progress Notes (Signed)
Patient ID: Ann Baldwin female [id number] DOB: Dec 11, 1945 DOA: 02/10/2012   Subjective:  "Feeling better.  Nausea is getting better."  Reporting adequate PO intake with no emesis.  Denies abdominal pain or shortness of breath.  Reporting mild weakness with ambulation with PT to come in the am.       Objective: Vital signs in last 24 hours: Temp:  [98 F (36.7 C)-98.9 F (37.2 C)] 98.8 F (37.1 C) (08/05 1400) Pulse Rate:  [88-103] 103  (08/05 1400) Resp:  [17-20] 18  (08/05 1400) BP: (111-130)/(55-73) 130/73 mmHg (08/05 1400) SpO2:  [94 %-98 %] 97 % (08/05 1400) Weight:  [173 lb 4.5 oz (78.6 kg)] 173 lb 4.5 oz (78.6 kg) (08/04 2044) Last BM Date: 02/15/12  Intake/Output from previous day: 08/04 0701 - 08/05 0700 In: 720 [P.O.:720] Out: 2600 [Urine:2600]  Physical Examination: General: alert, cooperative and no distress Resp: clear to auscultation bilaterally Cardio: regular rate and rhythm, S1, S2 normal, no murmur, click, rub or gallop GI: Active bowel sounds noted.  Abd distended.  No tenderness on palpation. Extremities: extremities normal, atraumatic, no cyanosis or edema  Labs: WBC/Hgb/Hct/Plts:  3.5/9.9/30.0/329 (08/05 0515) BUN/Cr/glu/ALT/AST/amyl/lip:  46/3.55/--/--/--/--/-- (08/05 0857)  Studies/Results:  Ct Abdomen Pelvis Wo Contrast  02/11/2012 **ADDENDUM** CREATED: 02/11/2012 00:27:29 The patient has new renal failure. No evidence for hydronephrosis. There is a persistent nephrogram on this noncontrast exam, a finding which can be associated with contrast nephropathy. The patient was last given intravenous contrast on 02/01/2012. **END ADDENDUM** SIGNED BY: Blair Hailey. Manson Passey, M.D.  02/10/2012 *RADIOLOGY REPORT* Clinical Data: Abdominal pain. Diarrhea, nausea. Colonoscopy 07/16 showed a large ulceration in the ascending colon, pathology showing adenocarcinoma. CT ABDOMEN AND PELVIS WITHOUT CONTRAST Technique: Multidetector CT imaging of the abdomen and pelvis was  performed following the standard protocol without intravenous contrast. Comparison: CT of the abdomen pelvis 02/01/2012 Findings: There are bilateral pleural effusions, left greater than right. Bibasilar atelectasis/consolidation is present, increased since the prior study. As identified previously, there is a large amount of ascites. Peritoneal nodules and omental stranding are again identified, consistent with carcinomatosis. The liver shows no focal lesions. No focal abnormality identified within the spleen, pancreas, or adrenal glands. A hiatal hernia is present. Small bowel loops show no obstruction although surrounded by mesenteric edema and ascites. The gallbladder contains dependent, calcified gallstone. Colonic loops are relatively collapsed. Within the pelvis, solid and cystic adnexal masses are again noted, unchanged in appearance. Uterine fibroids are present. A Foley catheter decompresses the bladder. There is diffuse body wall edema. Degenerative changes are seen in the spine. There is dense atherosclerotic calcification of the abdominal aorta but no aneurysm. IMPRESSION: 1. Large amount of ascites, peritoneal and omental disease, consistent with carcinomatosis. 2. No evidence for bowel obstruction. 3. Complex bilateral ovarian masses. Differential diagnosis includes primary ovarian malignancy or ovarian metastases, which can originate from other sites such as stomach, pancreas, and colon primaries. Original Report Authenticated By: Patterson Hammersmith, M.D.  Dg Chest 2 View  02/11/2012 *RADIOLOGY REPORT* Clinical Data: Shortness of breath, cough, hypoxia. CHEST - 2 VIEW Comparison: None Findings: Very low lung volumes with bibasilar atelectasis. Small effusions present. Heart size is borderline, accentuated by the low volumes. Mild vascular congestion. IMPRESSION: Very low lung volumes with bibasilar atelectasis and small effusions. Original Report Authenticated By: Cyndie Chime, M.D.  Ct Abdomen  Pelvis W Contrast  02/01/2012 *RADIOLOGY REPORT* Clinical Data: Abdominal distention, malignant right colonic ulcer, suspected ascites, lower abdominal pain, nausea, question metastatic  cancer CT ABDOMEN AND PELVIS WITH CONTRAST Technique: Multidetector CT imaging of the abdomen and pelvis was performed following the standard protocol during bolus administration of intravenous contrast. Sagittal and coronal MPR images reconstructed from axial data set. Contrast: 80mL OMNIPAQUE IOHEXOL 300 MG/ML SOLN Dilute oral contrast. Comparison: None Findings: Subsegmental atelectasis bilateral lower lobes greater on left. Minimal left pleural effusion. Significant ascites. Scattered atherosclerotic calcifications aorta and coronary arteries. Cholelithiasis. Liver, spleen, pancreas, kidneys, adrenal glands normal appearance. Enhancing 1.4 x 1.6 cm diameter probable leiomyoma within the uterus. Additional enhancing nodule adjacent to the uterine fundus question exophytic leiomyoma, 2.3 x 2.0 cm image 82. Complex solid and cystic appearing mass in left adnexa, 5.0 x 6.2 cm suspicious for a cystic ovarian neoplasm. Additional right ovarian soft tissue mass 3.9 x 2.9 cm image 80. Enlarged right inguinal lymph node 2.5 x 1.5 cm image 88. Enlarged left periaortic lymph node 2.1 x 1.6 x 3.3 cm image 46. Omental infiltration and soft tissue density consistent with omental tumor/caking. Additionally, slight nodular appearance to the undersurface of the right diaphragm question peritoneal tumor implants. Findings stranding is identified within the perihepatic ascites question adhesions, tumor strands, or septations within the ascitic fluid. Probable hiatal hernia. Remainder stomach decompressed. No definite colonic tumor identified. Redundant cecum in right upper quadrant. Appendix not definitely localized. No definite focal gastric or bowel abnormality otherwise identified. No definite acute osseous findings. IMPRESSION: Bilateral  ovarian masses, larger and more cystic on left, highly suspicious for ovarian neoplasms. Significant ascites with evidence of peritoneal carcinomatosis. Retroperitoneal and right inguinal adenopathy. Cholelithiasis. Hiatal hernia. No definite focal bowel lesion / mass evident by CT. Original Report Authenticated By: Lollie Marrow, M.D.   Medications:  Scheduled Meds:   .  atorvastatin  40 mg  Oral  Daily   .  enoxaparin (LOVENOX) injection  30 mg  Subcutaneous  Q24H   .  ezetimibe  10 mg  Oral  Daily   .  feeding supplement  1 Container  Oral  TID BM   .  insulin aspart  0-9 Units  Subcutaneous  TID WC   .  iron polysaccharides  150 mg  Oral  Daily   .  metoCLOPramide (REGLAN) injection  5 mg  Intravenous  Q6H   .  sodium bicarbonate  650 mg  Oral  TID   .  sodium chloride  3 mL  Intravenous  Q12H   .  DISCONTD: calcium acetate  1,334 mg  Oral  TID WC    Continuous Infusions:   .  sodium chloride  75 mL/hr at 02/15/12 0146    Assessment/Plan:  - Acute renal failure: Improving with last creatinine of 3.55.  - Complex bilateral ovarian masses, omental caking, and peritoneal carcinomatosis: Plan either surgery or neoadjuvant chemotherapy with subsequent surgery once renal function normalized.  Follow up appointment with Dr. Nelly Rout scheduled for August 8th at 11 am.  Dr. Darrold Span following patient.  - Ascites:  5 L removed during paracentesis on 02/11/12. Cytology:  RARE WBC PRESENT,BOTH PMN AND MONONUCLEAR, NO ORGANISMS SEEN, No growth in three days.  CROSS, MELISSA DEAL 02/15/2012, 4:27 PM

## 2012-02-16 ENCOUNTER — Inpatient Hospital Stay (HOSPITAL_COMMUNITY): Payer: Medicare Other

## 2012-02-16 ENCOUNTER — Other Ambulatory Visit: Payer: Self-pay | Admitting: Gynecologic Oncology

## 2012-02-16 DIAGNOSIS — R188 Other ascites: Secondary | ICD-10-CM

## 2012-02-16 LAB — GLUCOSE, CAPILLARY
Glucose-Capillary: 88 mg/dL (ref 70–99)
Glucose-Capillary: 128 mg/dL — ABNORMAL HIGH (ref 70–99)

## 2012-02-16 LAB — RENAL FUNCTION PANEL
Albumin: 1.8 g/dL — ABNORMAL LOW (ref 3.5–5.2)
BUN: 36 mg/dL — ABNORMAL HIGH (ref 6–23)
Creatinine, Ser: 2.57 mg/dL — ABNORMAL HIGH (ref 0.50–1.10)
Glucose, Bld: 86 mg/dL (ref 70–99)
Phosphorus: 3.3 mg/dL (ref 2.3–4.6)
Potassium: 4.3 mEq/L (ref 3.5–5.1)

## 2012-02-16 LAB — METHYLMALONIC ACID, SERUM: Methylmalonic Acid, Quantitative: 0.39 umol/L (ref <0.40)

## 2012-02-16 MED ORDER — GLUCERNA SHAKE PO LIQD
237.0000 mL | Freq: Three times a day (TID) | ORAL | Status: DC
  Administered 2012-02-16 – 2012-02-17 (×3): 237 mL via ORAL

## 2012-02-16 NOTE — Progress Notes (Deleted)
Pt ready for discharge, report called to Jacelyn Pi at [facility], right wrist saline lock discontinued, transportation ordered by SW

## 2012-02-16 NOTE — Procedures (Signed)
Procedure : paracentesis Specimen : 2.4 L amber colored fluid Complications : none immediate  Pt tolerated well with no immediate complications- full report in [place]

## 2012-02-16 NOTE — Progress Notes (Signed)
Patient ID: Ann Baldwin  female  AVW:[id number]    DOB: 11-12-1945    DOA: 02/10/2012   PCP: Allie Dimmer, OTR   Brief narrative and hospitalization course so far:  Patient is a 66 year old female with history of diabetes, hypertension, hyperlipidemia, new diagnosis of ovarian mass likely carcinoma presented with acute renal failure. Patient was told by Gyn-Onc, Dr Nelly Rout to come to the ER. Patient's creatinine was 1.48 on 01/18/2012, however she had been taking Motrin up until to 3 days prior to admission, she had a CT scan with dye exposure, nausea, vomiting, diarrhea for one month prior to admission. The patient reported that she has been having chronic nausea, vomiting and abdominal pain since April of 2013, was initially treated as IBD without improvement. Colonoscopy on 01/26/2012 showed a large also in descending colon. Biopsy of the ulcer showed adenocarcinoma. CT scan with contrast on 02/01/2012 showed 5 axis 6.2 cm complex left adnexal mass with highly elevated CEA-125 at [address]. In the ED patient was found to have creatinine of 9.0 and was admitted for acute renal failure with a workup of ovarian masses.   Patient was admitted for acute renal failure, likely thought to be multifactorial, including prerenal secondary to volume depletion due to nausea, vomiting and diarrhea worsened due to NSAID use, ACE inhibitors, Lasix with IV contrast exposure, hypertension and diabetes. Renal service was consulted and patient was placed on IV hydration. All the nephrotoxic medications were held. Renal ultrasound was obtained and did not show any hydronephrosis. FeNa 0.67 and FeUrea 6.7% were consistent with prerenal state.  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 Patient was followed by gyn-oncology. Patient has an appointment on 02/18/2012 with Dr. Nelly Rout and plan as for either surgery or neoadjuvant  chemotherapy with subsequent surgery once the renal function has normalized. Oncology was also consulted and patient was followed by Dr. Darrold Span, who also agreed with gyn-oncology recommendations  Ascites: Patient did have abdominal distention or shortness of breath at the time of admission. Interventional radiology was consulted and patient underwent paracentesis, 5 L were removed. 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 Patient will have repeat IR evaluation for therapeutic paracentesis prior to DC. PT evaluation was done and recommended home health PT with other resources which were added.   Consultants:  Renal service, Dr. Eliott Nine  Gyn-ONCology Oncology- Dr Darrold Span   Procedures and Radiological studies during this hospitalization:  CT abdomen and pelvis on 02/10/2012 showed large amount of ascites, paratonia in her mental disease, consistent with carcinomatosis, no evidence for bowel obstruction, complex bilateral ovarian masses  NM V/Q scan on 02/11/2012 : Low probability for pulmonary embolism  Renal ultrasound on 02/11/2012: No acute findings by ultrasound, no hydronephrosis  Ultrasound guided paracentesis on 02/11/2012, using 5 L of turbid yellow ascitic fluid.   Antibiotics:  Antibiotics: None  Cultures:  Ascitic fluid culture: Negative   Subjective: States nausea is improving, feels strength is returning. No significant shortness of breath.   Objective: Weight change: 0 kg (0 lb)  Intake/Output Summary (Last 24 hours) at 02/16/12 1446 Last data filed at 02/16/12 1300  Gross per 24 hour  Intake   1710 ml  Output    250 ml  Net   1460 ml   Blood pressure 123/70, pulse 105, temperature 98 F (36.7 C), temperature source Oral, resp. rate 20, height 5' 3.89" (1.623 m), weight 78.6  kg (173 lb 4.5 oz), SpO2 94.00%.  Physical Exam: General: Alert and awake, oriented x3, not in any acute distress. HEENT:  anicteric sclera, pupils reactive to light and accommodation, EOMI CVS: S1-S2 clear, no murmur rubs or gallops Chest: clear to auscultation bilaterally, no wheezing, rales or rhonchi Abdomen: soft nontender, distended, normal bowel sounds, no organomegaly Extremities: no cyanosis, clubbing or edema noted bilaterally   Lab Results: Basic Metabolic Panel:  Lab 02/16/12 1610 02/15/12 0857 02/10/12 1740  NA 143 140 --  K 4.3 4.5 --  CL 114* 110 --  CO2 24 21 --  GLUCOSE 86 113* --  BUN 36* 46* --  CREATININE 2.57* 3.55* --  CALCIUM 8.1* 8.4 --  MG -- -- 1.7  PHOS 3.3 -- --   Liver Function Tests:  Lab 02/16/12 0520 02/15/12 0857  AST -- --  ALT -- --  ALKPHOS -- --  BILITOT -- --  PROT -- --  ALBUMIN 1.8* 2.0*   CBC:  Lab 02/15/12 0515 02/10/12 1740  WBC 3.5* 5.3  NEUTROABS -- --  HGB 9.9* 10.7*  HCT 30.0* 31.8*  MCV 88.2 86.4  PLT 329 407*   Cardiac Enzymes: No results found for this basename: CKTOTAL:3,CKMB:3,CKMBINDEX:3,TROPONINI:3 in the last 168 hours BNP: No components found with this basename: POCBNP:2 CBG:  Lab 02/16/12 1125 02/16/12 0712 02/15/12 2134 02/15/12 1643 02/15/12 1134  GLUCAP 112* 88 109* 120* 114*     Medications: Scheduled Meds:   . atorvastatin  40 mg Oral Daily  . enoxaparin (LOVENOX) injection  30 mg Subcutaneous Q24H  . ezetimibe  10 mg Oral Daily  . feeding supplement  1 Container Oral TID BM  . insulin aspart  0-9 Units Subcutaneous TID WC  . iron polysaccharides  150 mg Oral Daily  . metoCLOPramide (REGLAN) injection  5 mg Intravenous Q6H  . sodium bicarbonate  650 mg Oral TID  . sodium chloride  3 mL Intravenous Q12H     Assessment/Plan: Active Problems:  ARF (acute renal failure): Improving, no vomiting or any diarrhea, slowly improving, today 2.5 (<- 9.0 on admission) - No Lasix, NSAIDs, ACE inhibitors, continue IV fluid, patient tolerating diet - Renal ultrasound obtained, no hydronephrosis  - Per renal, no plans of  any hemodialysis    Ovarian mass: Newly diagnosed, appears to be metastatic with peritoneal carcinomatosis, CA 125 extremely elevated at [address].2 - Appreciate oncology and Gyn-Onc recommendations follow-up, plan either surgery or neoadjuvant chemotherapy with subsequent surgery once her renal function has normalized, followup with Dr. Nelly Rout on August 8th and supportive care for now.  Chronic nausea: Somewhat improved after the scheduled Reglan - Continue current management, patient tolerating regular diet   Diabetes mellitus: Controlled now - Continue sliding scale insulin    Ascites: Status post paracentesis on 02/11/2012, 5 L removed. culture so far negative. - Cytology positive for metastatic gynecological Ca  Hypotension: BP stable now - Hold off on any antihypertensives, continue gentle hydration   Hyperlipidemia: Continue Zetia and atorvastatin  DVT Prophylaxis: Lovenox  Code Status:  Disposition: DC home tomorrow, discussed with Dr. Briant Cedar today. PT evaluation today, continue gentle hydration, IR for possible paracentesis tomorrow. Follow up with Dr. Nelly Rout on 02/18/2012.    LOS: 6 days   Dylynn Ketner M.D. [facility] 02/16/2012, 2:46 PM Pager: [phone]  If 7PM-7AM, please contact night-coverage [email or web address] Password [email or web address]

## 2012-02-16 NOTE — Progress Notes (Signed)
Nutrition Follow-up/New Consult  Intervention:  Change nutrition supplement to Glucerna Shake TID. Will D/C Resource Breeze.   Assessment:   New consult received for assessment of nutrition requirements and status. Diet has been advanced. Patient tolerating, but continues to have decreased appetite with nausea. She reports eating most of her breakfast, but none of lunch due to nausea after ambulating. She is receiving Resource Breeze TID, but doesn't like.   Acute renal failure is improving. Patient is s/p paracentesis on 8/1.   Diet Order:  Regular, average intake 25-50% of meals  Meds: Scheduled Meds:   . atorvastatin  40 mg Oral Daily  . enoxaparin (LOVENOX) injection  30 mg Subcutaneous Q24H  . ezetimibe  10 mg Oral Daily  . feeding supplement  1 Container Oral TID BM  . insulin aspart  0-9 Units Subcutaneous TID WC  . iron polysaccharides  150 mg Oral Daily  . metoCLOPramide (REGLAN) injection  5 mg Intravenous Q6H  . sodium bicarbonate  650 mg Oral TID  . sodium chloride  3 mL Intravenous Q12H   Continuous Infusions:   . sodium chloride 75 mL/hr at 02/16/12 0431   PRN Meds:.HYDROcodone-acetaminophen, ondansetron, ondansetron, zolpidem  Labs:  CMP     Component Value Date/Time   NA 143 02/16/2012 0520   K 4.3 02/16/2012 0520   CL 114* 02/16/2012 0520   CO2 24 02/16/2012 0520   GLUCOSE 86 02/16/2012 0520   BUN 36* 02/16/2012 0520   CREATININE 2.57* 02/16/2012 0520   CALCIUM 8.1* 02/16/2012 0520   PROT 6.5 02/09/2012 1011   ALBUMIN 1.8* 02/16/2012 0520   AST 9 02/09/2012 1011   ALT <8 02/09/2012 1011   ALKPHOS 74 02/09/2012 1011   BILITOT 0.2* 02/09/2012 1011   GFRNONAA 18* 02/16/2012 0520   GFRAA 21* 02/16/2012 0520     Intake/Output Summary (Last 24 hours) at 02/16/12 1444 Last data filed at 02/16/12 1300  Gross per 24 hour  Intake   1710 ml  Output    250 ml  Net   1460 ml    Weight Status:  78.6 kg, up from 77.1 kg on 8/1  Re-estimated needs:   1925-2100 kcal 70-85 g  protein >2 L fluid  Nutrition Dx:  Inadequate oral intake, ongoing  Goal:  Patient to meet >/=90% of estimated nutrition needs, not met  Monitor:  PO intake, weight, labs   Linnell Fulling, RD, LDN Pager #: [phone] After-Hours Pager #: [phone]

## 2012-02-16 NOTE — Progress Notes (Signed)
Physical Therapy Evaluation Patient Details Name: Ann Baldwin MRN: [id number] DOB: 24-Mar-1946 Today's Date: 02/16/2012 Time: 4098-1191 PT Time Calculation (min): 28 min  PT Assessment / Plan / Recommendation Clinical Impression  66 yo female admitted with renal dysfunction and with recent diagnosis of ovarian Ca presents with decr functional mobility, decr activity tol; Will benefit from acute PT services to maximize independence and safety with mobility/amb and enable safe dc home    PT Assessment  Patient needs continued PT services    Follow Up Recommendations  Home health PT;Supervision/Assistance - 24 hour    Barriers to Discharge        Equipment Recommendations  Rolling walker with 5" wheels;Tub/shower seat;3 in 1 bedside comode    Recommendations for Other Services     Frequency Min 3X/week    Precautions / Restrictions Precautions Precautions: Fall   Pertinent Vitals/Pain No specific reports; concerned about nausea (was premedicated)  Walked on Room air and O2 sats decr to 89% lowest observed; Incr to 97% with O2 restarted and seated rest Reported dizziness with amb; BP post amb 143/70 RN notified      Mobility  Bed Mobility Bed Mobility: Supine to Sit;Sitting - Scoot to Edge of Bed Supine to Sit: 5: Supervision Sitting - Scoot to Edge of Bed: 5: Supervision Details for Bed Mobility Assistance: pretty smooth transition and good management of bed clothes Transfers Transfers: Sit to Stand;Stand to Sit Sit to Stand: 4: Min guard;From bed;With upper extremity assist Stand to Sit: 4: Min guard;To chair/3-in-1;With upper extremity assist Details for Transfer Assistance: Cues for safety and hand placement Ambulation/Gait Ambulation/Gait Assistance: 4: Min guard (with and without physical contact) Ambulation Distance (Feet): 30 Feet Assistive device: Rolling walker Ambulation/Gait Assistance Details: Slow pace; Cues to self-monitor for activity tolerance; Good  use of RW, dependent on UE support Gait Pattern: Decreased stride length    Exercises     PT Diagnosis: Difficulty walking;Generalized weakness  PT Problem List: Decreased strength;Decreased activity tolerance;Decreased balance;Decreased mobility;Decreased knowledge of use of DME PT Treatment Interventions: DME instruction;Gait training;Stair training;Functional mobility training;Therapeutic activities;Therapeutic exercise;Patient/family education   PT Goals Acute Rehab PT Goals PT Goal Formulation: With patient Time For Goal Achievement: 02/23/12 Potential to Achieve Goals: Good Pt will go Supine/Side to Sit: with modified independence PT Goal: Supine/Side to Sit - Progress: Goal set today Pt will go Sit to Supine/Side: with modified independence PT Goal: Sit to Supine/Side - Progress: Goal set today Pt will go Sit to Stand: with supervision PT Goal: Sit to Stand - Progress: Goal set today Pt will go Stand to Sit: with supervision PT Goal: Stand to Sit - Progress: Goal set today Pt will Ambulate: >150 feet;with supervision;with rolling walker;with least restrictive assistive device PT Goal: Ambulate - Progress: Goal set today  Visit Information  Last PT Received On: 02/16/12 Assistance Needed: +1    Subjective Data  Subjective: Agreeable to OOB Patient Stated Goal: home tomorrow   Prior Functioning  Home Living Lives With: Alone Available Help at Discharge: Family;Available 24 hours/day Type of Home: House Home Access: Level entry Home Layout: One level Bathroom Shower/Tub: Walk-in shower (at sister's home) Home Adaptive Equipment: None Prior Function Level of Independence: Independent Comments: Can go to own house with son assist, or to sister's home (handicap accessible) with son/sister assist Communication Communication: No difficulties    Cognition  Overall Cognitive Status: Appears within functional limits for tasks assessed/performed Arousal/Alertness:  Awake/alert Orientation Level: Appears intact for tasks assessed Behavior During  Session: [facility] for tasks performed    Extremity/Trunk Assessment Right Upper Extremity Assessment RUE ROM/Strength/Tone: Within functional levels Left Upper Extremity Assessment LUE ROM/Strength/Tone: Within functional levels Right Lower Extremity Assessment RLE ROM/Strength/Tone: Deficits RLE ROM/Strength/Tone Deficits: Generalized weakness, with dependence on UE support Left Lower Extremity Assessment LLE ROM/Strength/Tone: Deficits LLE ROM/Strength/Tone Deficits: Generalized weakness, with dependence on UE support   Balance    End of Session PT - End of Session Equipment Utilized During Treatment: Gait belt Activity Tolerance: Patient limited by fatigue Patient left: in chair;with call bell/phone within reach Nurse Communication: Mobility status (O2 sats on Room Air)  GP     Van Clines [place], [place] [phone]  02/16/2012, 1:52 PM

## 2012-02-17 ENCOUNTER — Other Ambulatory Visit: Payer: Self-pay | Admitting: Oncology

## 2012-02-17 ENCOUNTER — Telehealth: Payer: Self-pay | Admitting: Oncology

## 2012-02-17 DIAGNOSIS — N19 Unspecified kidney failure: Secondary | ICD-10-CM

## 2012-02-17 DIAGNOSIS — C569 Malignant neoplasm of unspecified ovary: Secondary | ICD-10-CM

## 2012-02-17 LAB — BASIC METABOLIC PANEL
CO2: 23 mEq/L (ref 19–32)
Calcium: 8 mg/dL — ABNORMAL LOW (ref 8.4–10.5)
Glucose, Bld: 90 mg/dL (ref 70–99)
Potassium: 4.2 mEq/L (ref 3.5–5.1)
Sodium: 142 mEq/L (ref 135–145)

## 2012-02-17 LAB — GLUCOSE, CAPILLARY: Glucose-Capillary: 138 mg/dL — ABNORMAL HIGH (ref 70–99)

## 2012-02-17 MED ORDER — POLYSACCHARIDE IRON COMPLEX 150 MG PO CAPS: 150.0000 mg | ORAL_CAPSULE | Freq: Every day | ORAL | Status: DC

## 2012-02-17 NOTE — Progress Notes (Signed)
Physical Therapy Treatment Patient Details Name: Ann Baldwin MRN: [id number] DOB: June 05, 1946 Today's Date: 02/17/2012 Time: 1191-4782 PT Time Calculation (min): 14 min  PT Assessment / Plan / Recommendation Comments on Treatment Session  Making steady progress, which will likely continue with HHPT follow-up; Pt is happy at the thought of going home    Follow Up Recommendations  Home health PT;Supervision/Assistance - 24 hour    Barriers to Discharge        Equipment Recommendations  Rolling walker with 5" wheels;Tub/shower seat;3 in 1 bedside comode (equipment delivered to room)    Recommendations for Other Services    Frequency Min 3X/week   Plan Discharge plan remains appropriate    Precautions / Restrictions Precautions Precautions: Fall   Pertinent Vitals/Pain No specific reports     Mobility  Transfers Transfers: Sit to Stand;Stand to Sit Sit to Stand: 4: Min guard;With armrests;From chair/3-in-1 Stand to Sit: 4: Min guard;With armrests;To chair/3-in-1 Details for Transfer Assistance: Cues for safety and hand placement Ambulation/Gait Ambulation/Gait Assistance: 4: Min guard (without physical contact) Ambulation Distance (Feet): 60 Feet Assistive device: Rolling walker Ambulation/Gait Assistance Details: Slow pace, but pt with subjective report of feeling better; Cues to self- monitor for activity tol; no reports of dizziness Gait Pattern: Decreased stride length Stairs:  (Discussed steps; Pt opted not to practice)    Exercises     PT Diagnosis:    PT Problem List:   PT Treatment Interventions:     PT Goals Acute Rehab PT Goals Time For Goal Achievement: 02/23/12 Potential to Achieve Goals: Good Pt will go Sit to Stand: with supervision PT Goal: Sit to Stand - Progress: Progressing toward goal Pt will go Stand to Sit: with supervision PT Goal: Stand to Sit - Progress: Progressing toward goal Pt will Ambulate: >150 feet;with supervision;with rolling  walker;with least restrictive assistive device PT Goal: Ambulate - Progress: Progressing toward goal  Visit Information  Last PT Received On: 02/17/12 Assistance Needed: +1    Subjective Data  Subjective: Feeling better; hopeful to go home   Cognition  Overall Cognitive Status: Appears within functional limits for tasks assessed/performed Arousal/Alertness: Awake/alert Orientation Level: Appears intact for tasks assessed Behavior During Session: [facility] for tasks performed    Balance     End of Session PT - End of Session Equipment Utilized During Treatment: Gait belt Activity Tolerance: Patient tolerated treatment well Patient left: in chair;with call bell/phone within reach Nurse Communication: Mobility status   GP     Olen Pel [place], [place] [phone]  02/17/2012, 1:08 PM

## 2012-02-17 NOTE — Progress Notes (Signed)
Discussed discharge instructions with pt. Pt showed no barriers to discharge. IV removed. Tele removed. Pt discharged to home with family.

## 2012-02-17 NOTE — Telephone Encounter (Signed)
Added appts for 8/9 and 8/14. Not able to reach pt by phone or lm. Added comment to 8/8 gynonc appt and chemo ed appt to send pt for new schedule.

## 2012-02-17 NOTE — Progress Notes (Signed)
   CARE MANAGEMENT NOTE 02/17/2012  Patient:  WM, FRUCHTER   Account Number:  [id number]  Date Initiated:  02/11/2012  Documentation initiated by:  Darlyne Russian  Subjective/Objective Assessment:   Patient admitted in ARF     Action/Plan:   Progression of care and discharge planning   Anticipated DC Date:     Anticipated DC Plan:        DC Planning Services  CM consult      [facility] Choice  HOME HEALTH   Choice offered to / List presented to:  C-1 Patient   DME arranged  3-N-1  Levan Hurst      DME agency  [facility]     HH arranged  HH-2 PT  HH-3 OT      [facility] agency  [facility]   Status of service:  In process, will continue to follow Medicare Important Message given?   (If response is "NO", the following Medicare IM given date fields will be blank) Date Medicare IM given:   Date Additional Medicare IM given:    Discharge Disposition:    Per UR Regulation:  Reviewed for med. necessity/level of care/duration of stay  If discussed at Long Length of Stay Meetings, dates discussed:   02/16/2012    Comments:  02/17/2012  [address] RN, [place]  [phone] Met with patient to discuss discharge planning for home health needs. She [facility] for home health services PT, OT. Patient plans to stay at sister Brenda's house at discharge. She does not need tub transfer bench she has a walk in shower at house with bench.  [facility]/Marie called with referral for home PT, OT  [facility]/Justin called with referral for 3:1, RW

## 2012-02-17 NOTE — Discharge Summary (Addendum)
Physician Discharge Summary  Ann Baldwin ZOX:[id number] DOB: Dec 14, 1945 DOA: 02/10/2012  PCP: Allie Dimmer, OTR  Admit date: 02/10/2012 Discharge date: 02/17/2012  Recommendations for Outpatient Follow-up:  1. Pt will need to follow up with PCP in 2-3 weeks post discharge 2. Please obtain BMP to evaluate electrolytes and kidney function. Bicarbonate was discontinued due to be improving renal function. 3. Please also check CBC to evaluate Hg and Hct levels 4. Held Januvia due to acute kidney injury.   followup regarding the need to restart Januvia. 5. Patient has a followup with Dr. Nelly Rout on August. She will need to schedule an appointment with Dr. Darrold Span.  Discharge Diagnoses:  Active Problems:  ARF (acute renal failure)  Ovarian mass  Diabetes mellitus  Ascites  Hypertension  Hyperlipidemia    Discharge Condition: Stable  Diet recommendation: Heart healthy diet discussed in details   History of present illness:  66 yo female with Dm2, Hypertension, Hyperlipidemia, new dx of ovarian mass likely ovarian cancer has ARF. Told by pcp to come to ER. Note that creatiine 1.48 on 01/18/2012 per notes in computer. Pt has been taking motrin up until 2-3 days ago. Pt had CT scan with dye exposure. Pt has had n/v, diarrhea for 1 month. Pt denies fever, chills, cp, palp, sob, orthopnea, lower ext edema.    Hospital Course:  Patient is a 66 year old female with history of diabetes, hypertension, hyperlipidemia, new diagnosis of ovarian mass likely carcinoma presented with acute renal failure. Patient was told by Gyn-Onc, Dr Nelly Rout to come to the ER. Patient's creatinine was 1.48 on 01/18/2012, however she had been taking Motrin up until to 3 days prior to admission, she had a CT scan with dye exposure, nausea, vomiting, diarrhea for one month prior to admission. The patient reported that she has been having chronic nausea, vomiting and abdominal pain since April of 2013, was  initially treated as IBD without improvement. Colonoscopy on 01/26/2012 showed a large also in descending colon. Biopsy of the ulcer showed adenocarcinoma. CT scan with contrast on 02/01/2012 showed 5 axis 6.2 cm complex left adnexal mass with highly elevated CEA-125 at 229[address]. In the ED patient was found to have creatinine of 9.0 and was admitted for acute renal failure with a workup of ovarian masses.   Patient was admitted for acute renal failure, likely thought to be multifactorial, including prerenal secondary to volume depletion due to nausea, vomiting and diarrhea worsened due to NSAID use, ACE inhibitors, Lasix with IV contrast exposure, hypertension and diabetes. Renal service was consulted and patient was placed on IV hydration. All the nephrotoxic medications were held. Renal ultrasound was obtained and did not show any hydronephrosis. FeNa 0.67 and FeUrea 6.7% were consistent with prerenal state.  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 Patient was followed by gyn-oncology. Patient has an appointment on 02/18/2012 with Dr. Nelly Rout and plan as for either surgery or neoadjuvant chemotherapy with subsequent surgery once the renal function has normalized. Oncology was also consulted and patient was followed by Dr. Darrold Span, who also agreed with gyn-oncology recommendations  Ascites: Patient did have abdominal distention or shortness of breath at the time of admission. Interventional radiology was consulted and patient underwent paracentesis, 5 L were removed. 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 Repeat paracentesis was performed on February 16, 2012. 2.4 L of ascitic fluid was removed. PT evaluation was done and recommended  home health PT with other resources which were added.    Procedures/Studies: Ct Abdomen Pelvis Wo Contrast  02/11/2012   **ADDENDUM** CREATED: 02/11/2012 00:27:29  The patient has new renal failure. No evidence for hydronephrosis. There is a persistent nephrogram on this noncontrast exam, a finding which can be associated with contrast nephropathy.  The patient was last given intravenous contrast on 02/01/2012.  **END ADDENDUM** SIGNED BY: Blair Hailey. Manson Passey, M.D.   02/10/2012  *RADIOLOGY REPORT*  Clinical Data: Abdominal pain.  Diarrhea, nausea.  Colonoscopy 07/16 showed a large ulceration in the ascending colon, pathology showing adenocarcinoma.  CT ABDOMEN AND PELVIS WITHOUT CONTRAST  Technique:  Multidetector CT imaging of the abdomen and pelvis was performed following the standard protocol without intravenous contrast.  Comparison: CT of the abdomen pelvis 02/01/2012  Findings: There are bilateral pleural effusions, left greater than right.  Bibasilar atelectasis/consolidation is present, increased since the prior study.  As identified previously, there is a large amount of ascites. Peritoneal nodules and omental stranding are again identified, consistent with carcinomatosis.  The liver shows no focal lesions.  No focal abnormality identified within the spleen, pancreas, or adrenal glands.  A hiatal hernia is present.  Small bowel loops show no obstruction although surrounded by mesenteric edema and ascites.  The gallbladder contains dependent, calcified gallstone.  Colonic loops are relatively collapsed.  Within the pelvis, solid and cystic adnexal masses are again noted, unchanged in appearance.  Uterine fibroids are present.  A Foley catheter decompresses the bladder.  There is diffuse body wall edema.  Degenerative changes are seen in the spine.  There is dense atherosclerotic calcification of the abdominal aorta but no aneurysm.  IMPRESSION:  1.  Large amount of ascites, peritoneal and omental disease, consistent with carcinomatosis. 2.  No evidence for bowel obstruction. 3.  Complex bilateral ovarian masses.  Differential  diagnosis includes primary ovarian malignancy or ovarian metastases, which can originate from other sites such as stomach, pancreas, and colon primaries.  Original Report Authenticated By: Patterson Hammersmith, M.D.   Dg Chest 2 View  02/11/2012  *RADIOLOGY REPORT*  Clinical Data: Shortness of breath, cough, hypoxia.  CHEST - 2 VIEW  Comparison: None  Findings: Very low lung volumes with bibasilar atelectasis.  Small effusions present.  Heart size is borderline, accentuated by the low volumes.  Mild vascular congestion.  IMPRESSION: Very low lung volumes with bibasilar atelectasis and small effusions.  Original Report Authenticated By: Cyndie Chime, M.D.   Nm Pulmonary Perfusion  02/11/2012  *RADIOLOGY REPORT*  Clinical Data: Hypoxemia.  Cancer.  NM PULMONARY PERFUSION PARTICULATE  Radiopharmaceutical: CURIE MAA TECHNETIUM TO 75M ALBUMIN AGGREGATED  Comparison: Chest radiograph 02/11/2012.  Findings: There is mildly heterogeneous perfusion of the left lung, compatible with appearance on chest radiograph with thickening of the fissure and lower lobe atelectasis best demonstrated on the left lateral projection.  Right lung appears well perfused.  There is volume loss at the left base.  IMPRESSION: Low probability pulmonary embolus.  Original Report Authenticated By: Andreas Newport, M.D.   Ct Abdomen Pelvis W Contrast  02/01/2012  *RADIOLOGY REPORT*  Clinical Data:  Abdominal distention, malignant right colonic ulcer, suspected ascites, lower abdominal pain, nausea, question metastatic cancer  CT ABDOMEN AND PELVIS WITH CONTRAST  Technique:  Multidetector CT imaging of the abdomen and pelvis was performed following the standard protocol during bolus administration of intravenous contrast. Sagittal and coronal MPR images reconstructed from axial data set.  Contrast: 80mL OMNIPAQUE IOHEXOL 300 MG/ML  SOLN Dilute oral contrast.  Comparison: None  Findings: Subsegmental atelectasis bilateral lower lobes  greater on left. Minimal left pleural effusion. Significant ascites. Scattered atherosclerotic calcifications aorta and coronary arteries. Cholelithiasis. Liver, spleen, pancreas, kidneys, adrenal glands normal appearance.  Enhancing 1.4 x 1.6 cm diameter probable leiomyoma within the uterus. Additional enhancing nodule adjacent to the uterine fundus question exophytic leiomyoma, 2.3 x 2.0 cm image 82. Complex solid and cystic appearing mass in left adnexa, 5.0 x 6.2 cm suspicious for a cystic ovarian neoplasm. Additional right ovarian soft tissue mass 3.9 x 2.9 cm image 80. Enlarged right inguinal lymph node 2.5 x 1.5 cm image 88. Enlarged left periaortic lymph node 2.1 x 1.6 x 3.3 cm image 46.  Omental infiltration and soft tissue density consistent with omental tumor/caking. Additionally, slight nodular appearance to the undersurface of the right diaphragm question peritoneal tumor implants. Findings stranding is identified within the perihepatic ascites question adhesions, tumor strands, or septations within the ascitic fluid. Probable hiatal hernia. Remainder stomach decompressed. No definite colonic tumor identified. Redundant cecum in right upper quadrant. Appendix not definitely localized. No definite focal gastric or bowel abnormality otherwise identified. No definite acute osseous findings.  IMPRESSION: Bilateral ovarian masses, larger and more cystic on left, highly suspicious for ovarian neoplasms. Significant ascites with evidence of peritoneal carcinomatosis. Retroperitoneal and right inguinal adenopathy. Cholelithiasis. Hiatal hernia. No definite focal bowel lesion / mass evident by CT.  Original Report Authenticated By: Lollie Marrow, M.D.   US Renal  02/11/2012  *RADIOLOGY REPORT*  Clinical Data: Acute renal failure, abdominal ascites  RENAL/URINARY TRACT ULTRASOUND COMPLETE  Comparison:  02/10/2012 CT exam  Findings:  Right Kidney:  9.4 cm length.  Normal cortex and echogenicity.  No  hydronephrosis or obstruction.  No focal abnormality.  Left Kidney:  10.1 cm length.  Limited visualization.  No hydronephrosis or definite focal abnormality.  Bladder:  Decompressed by Foley catheter  Abdominal ascites noted as well as bilateral pleural effusions.  IMPRESSION: No acute finding by ultrasound.  No hydronephrosis.  Original Report Authenticated By: Judie Petit. Ruel Favors, M.D.   US Paracentesis  02/11/2012  *RADIOLOGY REPORT*  Clinical Data: Abdominal discomfort, possible ovarian cancer, large volume ascites  ULTRASOUND GUIDED PARACENTESIS  Comparison:  None  An ultrasound guided paracentesis was thoroughly discussed with the patient and questions answered.  The benefits, risks, alternatives and complications were also discussed.  The patient understands and wishes to proceed with the procedure.  Written consent was obtained.  Ultrasound was performed to localize and mark an adequate pocket of fluid in the right lower quadrant of the abdomen.  The area was then prepped and draped in the normal sterile fashion.  1% Lidocaine was used for local anesthesia.  Under ultrasound guidance a 19 gauge Yueh catheter was introduced.  Paracentesis was performed.  The catheter was removed and a dressing applied.  Complications:  None  Findings:  A total of approximately 5 liters of turbid dark yellow fluid was removed.  A fluid sample was sent for laboratory analysis.  IMPRESSION: Successful ultrasound guided paracentesis yielding 5 liters of ascites.  Read by Brayton El PA-C  Original Report Authenticated By: Richarda Overlie, M.D.  02/16/12 paracentesis--2.4 liters removed without complication. Procedures and Radiological studies during this hospitalization:  CT abdomen and pelvis on 02/10/2012 showed large amount of ascites, paratonia in her mental disease, consistent with carcinomatosis, no evidence for bowel obstruction, complex bilateral ovarian masses   NM V/Q scan on 02/11/2012 : Low probability for pulmonary embolism  Renal ultrasound on 02/11/2012: No acute findings by ultrasound, no hydronephrosis  Ultrasound guided paracentesis on 02/11/2012, using 5 L of turbid yellow ascitic fluid.   Consultations:  Nephrology  GYN-ONC  Med-Onc  Interventional radiology   Antibiotics:  none  Discharge Exam: Filed Vitals:   02/17/12 0837  BP: 110/68  Pulse: 97  Temp: 98.6 F (37 C)  Resp: 18   Filed Vitals:   02/16/12 1801 02/16/12 2050 02/17/12 0543 02/17/12 0837  BP: 135/76 128/74 131/76 110/68  Pulse: 94 100 88 97  Temp: 98.9 F (37.2 C) 98.3 F (36.8 C) 98.6 F (37 C) 98.6 F (37 C)  TempSrc: Oral Oral Oral Oral  Resp: 18 18 18 18   Height:      Weight:  78.6 kg (173 lb 4.5 oz)    SpO2: 97% 93% 95% 95%    General: Pt is alert, follows commands appropriately, not in acute distress Cardiovascular: Regular rate and rhythm, S1/S2 +, no murmurs, no rubs, no gallops Respiratory: Clear to auscultation bilaterally, no wheezing, no crackles, no rhonchi Abdominal: Soft, non tender, non distended, bowel sounds +, no guarding Extremities: no cyanosis, pulses palpable bilaterally DP and PT, trace b/l pedal edema   Discharge Instructions  Discharge Orders    Future Appointments: Provider: Department: Dept Phone: Center:   02/18/2012 11:00 AM Laurette Schimke, MD PHD [facility] [phone] None   02/18/2012 12:30 PM [facility] [phone] None     Future Orders Please Complete By Expires   Diet - low sodium heart healthy      Increase activity slowly        Medication List  As of 02/17/2012 12:01 PM   STOP taking these medications         furosemide 20 MG tablet      ibuprofen 600 MG tablet      sitaGLIPtin 100 MG tablet         TAKE these medications         amLODipine-benazepril 10-20 MG per capsule   Commonly known as: LOTREL   Take 1 capsule by mouth daily.      aspirin 81 MG tablet    Take 81 mg by mouth daily.      atorvastatin 40 MG tablet   Commonly known as: LIPITOR   Take 40 mg by mouth daily.      ezetimibe 10 MG tablet   Commonly known as: ZETIA   Take 10 mg by mouth daily.      HYDROcodone-acetaminophen 5-325 MG per tablet   Commonly known as: NORCO/VICODIN   Take 1 tablet by mouth every 4 (four) hours as needed. For pain      iron polysaccharides 150 MG capsule   Commonly known as: NIFEREX   Take 1 capsule (150 mg total) by mouth daily.      ondansetron 8 MG tablet   Commonly known as: ZOFRAN   TAKE 1/2 TO 1 TABLET BY MOUTH EVERY 8 HOURS AS NEEDED FOR NAUSEA           Follow-up Information    Follow up with Laurette Schimke, MD PHD on 02/18/2012. (at 11am.  Arrive at 10:30 to get registered.)    Contact information:   [address] [place] [address] [phone]       Schedule an appointment as soon as possible for a visit with Allie Dimmer, OTR.   Contact information:   [address] - - [place] [address] [phone]       Follow up with Reece Packer, MD. Schedule an appointment as soon as possible for a visit in 1 week.   Contact information:   [address] [place] [address] [phone]           The results of significant diagnostics from this hospitalization (including imaging, microbiology, ancillary and laboratory) are listed below for reference.     Microbiology: Recent Results (from the past 240 hour(s))  BODY FLUID CULTURE     Status: Normal   Collection Time   02/11/12  3:43 PM      Component Value Range Status Comment   Specimen Description FLUID ASCITIC   Final    Special Requests   Final    Gram Stain     Final    Value: RARE WBC PRESENT,BOTH PMN AND MONONUCLEAR     NO ORGANISMS SEEN   Culture NO GROWTH 3 DAYS   Final    Report Status 02/15/2012 FINAL   Final      Labs: Basic Metabolic Panel:  Lab 02/17/12 5366 02/16/12 4403  02/15/12 4742 02/15/12 0515 02/14/12 0505 02/12/12 0816 02/10/12 1740  NA 142 143 140 139 135 -- --  K 4.2 4.3 4.5 4.4 4.5 -- --  CL 113* 114* 110 110 104 -- --  CO2 23 24 21 20 19  -- --  GLUCOSE 90 86 113* 81 77 -- --  BUN 26* 36* 46* 48* 60* -- --  CREATININE 1.73* 2.57* 3.55* 3.85* 5.92* -- --  CALCIUM 8.0* 8.1* 8.4 8.3* 7.5* -- --  MG -- -- -- -- -- -- 1.7  PHOS -- 3.3 4.0 4.2 5.0* 6.8* --   Liver Function Tests:  Lab 02/16/12 0520 02/15/12 0857 02/15/12 0515 02/14/12 0505 02/12/12 0816  AST -- -- -- -- --  ALT -- -- -- -- --  ALKPHOS -- -- -- -- --  BILITOT -- -- -- -- --  PROT -- -- -- -- --  ALBUMIN 1.8* 2.0* 1.8* 1.7* 2.1*   No results found for this basename: LIPASE:5,AMYLASE:5 in the last 168 hours No results found for this basename: AMMONIA:5 in the last 168 hours CBC:  Lab 02/15/12 0515 02/10/12 1740  WBC 3.5* 5.3  NEUTROABS -- --  HGB 9.9* 10.7*  HCT 30.0* 31.8*  MCV 88.2 86.4  PLT 329 407*   Cardiac Enzymes: No results found for this basename: CKTOTAL:5,CKMB:5,CKMBINDEX:5,TROPONINI:5 in the last 168 hours BNP: BNP (last 3 results) No results found for this basename: PROBNP:3 in the last 8760 hours CBG:  Lab 02/17/12 1143 02/17/12 0833 02/16/12 2056 02/16/12 1801 02/16/12 1125  GLUCAP 99 138* 147* 128* 112*     SIGNED: Time coordinating discharge: Over 30 minutes  Rashaun Wichert, MD  [facility] 02/17/2012, 12:01 PM Pager [phone]  If 7PM-7AM, please contact night-coverage [email or web address] Password [email or web address]

## 2012-02-18 ENCOUNTER — Ambulatory Visit: Payer: Medicare Other | Attending: Gynecologic Oncology | Admitting: Gynecologic Oncology

## 2012-02-18 ENCOUNTER — Encounter: Payer: Self-pay | Admitting: Gynecologic Oncology

## 2012-02-18 ENCOUNTER — Telehealth: Payer: Self-pay | Admitting: Oncology

## 2012-02-18 ENCOUNTER — Other Ambulatory Visit: Payer: Medicare Other

## 2012-02-18 ENCOUNTER — Encounter: Payer: Self-pay | Admitting: *Deleted

## 2012-02-18 ENCOUNTER — Telehealth: Payer: Self-pay | Admitting: *Deleted

## 2012-02-18 VITALS — BP 122/72 | HR 76 | Temp 98.4°F | Resp 20 | Ht 66.34 in | Wt 174.7 lb

## 2012-02-18 DIAGNOSIS — R188 Other ascites: Secondary | ICD-10-CM

## 2012-02-18 NOTE — Patient Instructions (Addendum)
  F/U with Dr. Darrold Span for chemotherapy.  Given recent kidney injury will start with single agent taxol and add carboplatin with the next cycle.  Will reassess after 3-4 cycles of carbo for possible interval debulking. The entire visit was spent with the patient and her family is discussion of the treatment plan.    Thank you very much Ms. [name] for allowing me to provide care for you today.  I appreciate your confidence in choosing our Gynecologic Oncology team.  If you have any questions about your visit today please call our office and we will get back to you as soon as possible.  Maryclare Labrador. Brionna Romanek MD., PhD Gynecologic Oncology

## 2012-02-18 NOTE — Telephone Encounter (Signed)
gv pt appt schedule for August.  °

## 2012-02-18 NOTE — Progress Notes (Signed)
Office Visit Note: Gyn-Onc  Consult was requested by Dr. Leone Payor for the evaluation of Ann Baldwin 66 y.o. female  CC:  Chief Complaint  Patient presents with  . Ascites     follow up    HPI: 65 y/o G2P2 LNMP 40's  presented in April 2013 with abdominal discomfort, diarrhea, nausea, no constipation  that was thought to be IBD. The symptoms persisted and she was referred for colonoscopy 01/26/2012.  Findings were notable for  A large ulcer with heaped up margins in the ascending colon.  It was difficult to see and to obtain biopsies.    Pathology was c/w adenocarcinoma and the concern that it was not a primary colonic adenocarcinoma.  The patient denies rectal bleeding or weight loss, she reports poor appetite, abdominal distension for two months.  Early satiety.  No vaginal bleeding.  CT abdomen and pelvis 02/01/2012 with a 5x6.2 cmcomplex solid and cystic left adnexal mass, 3.9x2.9 right ovarian soft tissue mass, omental caking and peritoneal carcinomatosis.    INTERVAL HISTORY>  A CA 125 returned > 22,000 CEA wnl, immuno stains of the biopsy were not c/w colon cancer.  Cr was > 8 and the patient was admitted with acute renal failure. Most recent creatine 1.7.  Patient feels much better since normalization of her Cr and the paracentesis woth removal of 5 liters of ascitic fluid.  Cytology from paracentesis was c/w Gyn primary.   Allergy: No Known Allergies  Social Hx:   History   Social History  . Marital Status: Single    Spouse Name: N/A    Number of Children: N/A  . Years of Education: N/A   Occupational History  . [employer]    Social History Main Topics  . Smoking status: Never Smoker   . Smokeless tobacco: Never Used  . Alcohol Use: No  . Drug Use: No  . Sexually Active: No   Other Topics Concern  . Not on file   Social History Narrative  . No narrative on file    Past Surgical Hx:  Past Surgical History  Procedure Date  . No past surgeries     Past  Medical Hx:  Past Medical History  Diagnosis Date  . Diabetes mellitus   . Hyperlipidemia   . Hypertension   . Ovarian cancer   Last mammogram 2 years ago wnl.  Past Gynecological History: G2 P2 LNMP in 40's.  Menarche 12 regular menses until menopause.  No contraceptives used.  No h/o abnormal pap test, Last pap 10 years ago.  NSVD x 2.  No LMP recorded. Patient is postmenopausal.  Family Hx:  Family History  Problem Relation Age of Onset  . Colon cancer Neg Hx   . Esophageal cancer Neg Hx   . Stomach cancer Neg Hx   . Rectal cancer Neg Hx     Review of Systems:  Constitutional  Feels much better, increase energy Cardiovascular  No chest pain, shortness of breath,  Pulmonary  No cough or wheeze.  Gastro Intestinal  Denies nausea,  No bright red blood per rectum.  No constipation.  Genito Urinary  No frequency, urgency, dysuria, no vaginal bleeding or discharge Musculo Skeletal  No myalgia, arthralgia, joint swelling or pain  Neurologic  No weakness, numbness, change in gait,  Psychology  No depression, anxiety,difficulty sleeping.   Vitals:  Blood pressure 122/72, pulse 76, temperature 98.4 F (36.9 C), temperature source Oral, resp. rate 20, height 5' 6.34" (1.685 m), weight 174  lb 11.2 oz (79.243 kg).  Physical Exam: Thin WD in NAD.  Bitemporal wasting smiling  Assessment/Plan:  Ann Baldwin  is a 66 y.o.  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 S/p resolution of multifactorial ARF.  F/U with Dr. Darrold Span for chemotherapy.  Given recent kidney injury will start with single agent taxol and add carboplatin with the next cycle.  Will reassess after 3-4 cycles of carbo for possible interval debulking. The entire visit was spent with the patient and her family is discussion of the treatment plan.      Laurette Schimke, MD, PhD 02/18/2012, 1:23 PM

## 2012-02-19 ENCOUNTER — Encounter: Payer: Self-pay | Admitting: Oncology

## 2012-02-19 ENCOUNTER — Ambulatory Visit (HOSPITAL_BASED_OUTPATIENT_CLINIC_OR_DEPARTMENT_OTHER): Payer: Medicare Other | Admitting: Oncology

## 2012-02-19 ENCOUNTER — Other Ambulatory Visit: Payer: Self-pay | Admitting: *Deleted

## 2012-02-19 ENCOUNTER — Other Ambulatory Visit (HOSPITAL_BASED_OUTPATIENT_CLINIC_OR_DEPARTMENT_OTHER): Payer: Medicare Other | Admitting: Lab

## 2012-02-19 ENCOUNTER — Telehealth: Payer: Self-pay | Admitting: Oncology

## 2012-02-19 VITALS — BP 105/70 | HR 89 | Temp 99.4°F | Resp 20 | Ht 66.34 in | Wt 176.8 lb

## 2012-02-19 DIAGNOSIS — N19 Unspecified kidney failure: Secondary | ICD-10-CM

## 2012-02-19 DIAGNOSIS — E119 Type 2 diabetes mellitus without complications: Secondary | ICD-10-CM

## 2012-02-19 DIAGNOSIS — C569 Malignant neoplasm of unspecified ovary: Secondary | ICD-10-CM

## 2012-02-19 LAB — CBC WITH DIFFERENTIAL/PLATELET
Basophils Absolute: 0 10*3/uL (ref 0.0–0.1)
EOS%: 1.8 % (ref 0.0–7.0)
Eosinophils Absolute: 0.1 10*3/uL (ref 0.0–0.5)
HCT: 32.1 % — ABNORMAL LOW (ref 34.8–46.6)
HGB: 10.4 g/dL — ABNORMAL LOW (ref 11.6–15.9)
MCH: 29.1 pg (ref 25.1–34.0)
MCV: 89.7 fL (ref 79.5–101.0)
NEUT#: 3.8 10*3/uL (ref 1.5–6.5)
NEUT%: 82.6 % — ABNORMAL HIGH (ref 38.4–76.8)
RDW: 15.1 % — ABNORMAL HIGH (ref 11.2–14.5)
lymph#: 0.4 10*3/uL — ABNORMAL LOW (ref 0.9–3.3)

## 2012-02-19 LAB — BASIC METABOLIC PANEL
BUN: 18 mg/dL (ref 6–23)
Chloride: 111 mEq/L (ref 96–112)
Creatinine, Ser: 1.22 mg/dL — ABNORMAL HIGH (ref 0.50–1.10)
Glucose, Bld: 124 mg/dL — ABNORMAL HIGH (ref 70–99)
Potassium: 4.3 mEq/L (ref 3.5–5.3)

## 2012-02-19 MED ORDER — DEXAMETHASONE 4 MG PO TABS: ORAL_TABLET | ORAL | Status: DC

## 2012-02-19 MED ORDER — LORAZEPAM 0.5 MG PO TABS: ORAL_TABLET | ORAL | Status: DC

## 2012-02-19 NOTE — Patient Instructions (Addendum)
Call if questions or concerns [phone] 

## 2012-02-19 NOTE — Telephone Encounter (Signed)
appts made and printed for pt aom °

## 2012-02-19 NOTE — Progress Notes (Signed)
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 I had met patient during recent hospitalization for acute renal failure; she was discharged on 02-17-12,then Dr Nelly Rout and attended chemotherapy teaching class on 8-8. She is scheduled for first chemotherapy on 02-24-12.  Patient had presented with GI symptoms thought IBD in April 2013. She had colonoscopy by Dr Stan Head 01-26-12, with large ulcer in ascending colon, this difficult to visualize and to biopsy. 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 CT AP (with contrast) in [facility] system 02-02-12 had complex solid and cystic left adnexal mass, right ovarian mass, omental caking and peritoneal carcinomatosis. She was seen by Dr Nelly Rout on 02-09-12 (special stains still pending on path at that time) and labs obtained, including CA 125 which resulted at 22,938 and CEA 0.5. CMET also resulted after Dr Forrestine Him visit had BUN 63 and creatinine 8.78. She was admitted to [facility] 7-31 thru 02-21-12 with acute renal failure felt multifactorial including prerenal with N/V/diarrhea/ascites, NSAID and ACE inhibitors, lasix, IV contrast, HTN and DM. Renal function progressively improved; she does not have follow up planned with nephrology outpatient. She had paracentesis of 5 liters on 02-11-12 and 2.4 liters on 02-18-12. Repeat CT without contrast 02-11-12 had no change in adnexal masses, peritoneal or omental disease, no bowel obstruction, some bilateral pleural fluid.  Patient has been somewhat more comfortable overall since DC, tho is not eating much (early satiety), is drinking fluids. She denies nausea or vomiting and has had no diarrhea or noted blood in stools. She could not tell much  difference with the 2.4 liter paracentesis, tho she was much more comfortable after the initial 5 liter paracentesis. She has not checked blood sugars since DC but can do this at home. She has had no fever, no increased shortness of breath, is able to sleep. Peripheral IV access was not difficult in hospital.  Remainder of 10 point Review of Systems negative. Patient tells me that usual blood sugars stay < 150 at home.  Granddaughter has been staying with her. Daughter in law tells me that patient will change primary care to a medical office in [place], [place]. Objective:  Vital signs in last 24 hours:  BP 105/70  Pulse 89  Temp 99.4 F (37.4 C) (Oral)  Resp 20  Ht 5' 6.34" (1.685 m)  Wt 176 lb 12.8 oz (80.196 kg)  BMI 28.24 kg/m2 Looks fairly comfortable seated in WC on RA. Family very supportive.  HEENT:PERRLA, sclera clear, anicteric and oropharynx clear, no lesions Normal hair pattern. LymphaticsCervical, supraclavicular, and axillary nodes normal. Resp: diminished BS in bases bilaterally otherwise clear to A & P Cardio: regular rate and rhythm GI: full, not more distended, some bowel sounds, not tender Extremities: trace pedal edema only, no cords or tenderness Neuro:nonfocal Skin without rash or ecchymoses  Lab Results:  Results for orders placed in visit on 02/19/12  CBC WITH DIFFERENTIAL      Component Value Range   WBC 4.6  3.9 - 10.3 10e3/uL   NEUT# 3.8  1.5 - 6.5 10e3/uL   HGB 10.4 (*) 11.6 - 15.9 g/dL   HCT 98.1 (*) 19.1 - 47.8 %   Platelets 356  145 - 400 10e3/uL   MCV 89.7  79.5 - 101.0 fL   MCH 29.1  25.1 -  34.0 pg   MCHC 32.4  31.5 - 36.0 g/dL   RBC 1.61 (*) 0.96 - 0.45 10e6/uL   RDW 15.1 (*) 11.2 - 14.5 %   lymph# 0.4 (*) 0.9 - 3.3 10e3/uL   MONO# 0.3  0.1 - 0.9 10e3/uL   Eosinophils Absolute 0.1  0.0 - 0.5 10e3/uL   Basophils Absolute 0.0  0.0 - 0.1 10e3/uL   NEUT% 82.6 (*) 38.4 - 76.8 %   LYMPH% 7.8 (*) 14.0 - 49.7 %   MONO% 7.4  0.0 - 14.0 %   EOS% 1.8   0.0 - 7.0 %   BASO% 0.4  0.0 - 2.0 %    Bmet available after visit has BUN down to 18, creatinine down to 1.22, glucose 124 and electrolytes otherwise normal. Studies/Results:  No results found.  Medications: I have reviewed the patient's current medications; she has zofran 8 mg tablets. Prescriptions for ativan 0.5 mg and decadron #10 tabs to be sent to [facility].Januvia was [facility] in hospital due to renal problems; per DC summary this may need to resume.  We have discussed beginning chemotherapy with single agent taxol used at weekly dosing day 1 and day 8 for cycle one chemotherapy. Dr Nelly Rout would like addition of carboplatin with further treatments if possible, which I think will need to be done very cautiously if so. I have given her written and oral instructions to take decadron 20 mg with food 12 hrs prior to first taxol. We will check CBG when she is here for treatment and cover with SS insulin if needed.  Assessment/Plan: 1.Primary gyn carcinoma: to begin neoadjuvant chemotherapy with single agent taxol at weekly dosing for cycle 1 due to recent acute renal failure. Cycle 1 day 1 will be 8-14. I will see her 8-19 with cbc/bmet and she will have day 8 on 8-21. I will see her again at least 9-3 with cbc/cmet/ca 125. We will add gCSF if needed. 2.mutlifactorial acute renal failure: much improved, need to follow closely 3.gyn tumor erosion into colon: no overt symptoms now 4. Diabetes: now off [place] due to renal problems. Family to let us know re apts with new PCP 5.HTN 6.poor po intake: discussed. May need dietician follow up   Patient and family followed discussion well and were in agreement with plan.  Reece Packer, MD   02/19/2012, 4:43 PM

## 2012-02-21 DIAGNOSIS — C569 Malignant neoplasm of unspecified ovary: Secondary | ICD-10-CM | POA: Insufficient documentation

## 2012-02-21 NOTE — ED Provider Notes (Addendum)
History    66 year old female sent for evaluation of renal failure. Patient was recently given diagnosis of abdominal/pelvic masses, likely ovarian tumor. Had labs times per this workup and renal failure noted. Patient is complaining of generalized weakness and decreased urinary output. No fevers or chills. Mild nausea but no vomiting. CSN: [id number]  Arrival date & time 02/10/12  1654   First MD Initiated Contact with Patient 02/10/12 1731      Chief Complaint  Patient presents with  . Abdominal Pain    (Consider location/radiation/quality/duration/timing/severity/associated sxs/prior treatment) HPI  Past Medical History  Diagnosis Date  . Diabetes mellitus   . Hyperlipidemia   . Hypertension   . Ovarian cancer     Past Surgical History  Procedure Date  . No past surgeries     Family History  Problem Relation Age of Onset  . Colon cancer Neg Hx   . Esophageal cancer Neg Hx   . Stomach cancer Neg Hx   . Rectal cancer Neg Hx     History  Substance Use Topics  . Smoking status: Never Smoker   . Smokeless tobacco: Never Used  . Alcohol Use: No    OB History    Grav Para Term Preterm Abortions TAB SAB Ect Mult Living                  Review of Systems  Review of symptoms negative unless otherwise noted in HPI.  Allergies  Review of patient's allergies indicates no known allergies.  Home Medications   Current Outpatient Rx  Name Route Sig Dispense Refill  . AMLODIPINE BESY-BENAZEPRIL HCL 10-20 MG PO CAPS Oral Take 1 capsule by mouth daily.    . ASPIRIN 81 MG PO TABS Oral Take 81 mg by mouth daily.    . ATORVASTATIN CALCIUM 40 MG PO TABS Oral Take 40 mg by mouth daily.    Marland Kitchen EZETIMIBE 10 MG PO TABS Oral Take 10 mg by mouth daily.    Marland Kitchen DEXAMETHASONE 4 MG PO TABS  5 tablets (20 mg) PO with food 12 hours prior to chemo. 10 tablet 0  . POLYSACCHARIDE IRON COMPLEX 150 MG PO CAPS Oral Take 1 capsule (150 mg total) by mouth daily. 30 capsule 0  . LORAZEPAM 0.5 MG  PO TABS  Take one PO or sublingual every 6 hours PRN nausea. Will make drowsy 20 tablet 0  . ONDANSETRON HCL 8 MG PO TABS  TAKE 1/2 TO 1 TABLET BY MOUTH EVERY 8 HOURS AS NEEDED FOR NAUSEA 30 tablet 0  . POLYETHYLENE GLYCOL 3350 PO PACK Oral Take 17 g by mouth 2 (two) times daily as needed.      BP 104/57  Pulse 86  Temp 98.4 F (36.9 C) (Oral)  Resp 18  Ht 5' 3.89" (1.623 m)  Wt 173 lb 4.5 oz (78.6 kg)  BMI 29.85 kg/m2  SpO2 95%  Physical Exam  Nursing note and vitals reviewed. Constitutional: She appears well-developed and well-nourished.       Laying in bed. Tired appearing but nontoxic.  HENT:  Head: Normocephalic and atraumatic.  Eyes: Conjunctivae are normal. Right eye exhibits no discharge. Left eye exhibits no discharge.  Neck: Neck supple.  Cardiovascular: Normal rate, regular rhythm and normal heart sounds.  Exam reveals no gallop and no friction rub.   No murmur heard. Pulmonary/Chest: Effort normal and breath sounds normal. No respiratory distress.  Abdominal: Soft.       Mild distention. No tenderness.  Genitourinary:  Vaginal mass visible at the introitus.  Musculoskeletal: She exhibits no edema and no tenderness.  Neurological: She is alert.  Skin: Skin is warm and dry.  Psychiatric: She has a normal mood and affect. Her behavior is normal. Thought content normal.    ED Course  Procedures (including critical care time)  Labs Reviewed  BASIC METABOLIC PANEL - Abnormal; Notable for the following:    CO2 17 (*)     BUN 68 (*)     Creatinine, Ser 9.03 (*)     GFR calc non Af Amer 4 (*)     GFR calc Af Amer 5 (*)     All other components within normal limits  PHOSPHORUS - Abnormal; Notable for the following:    Phosphorus 7.5 (*)     All other components within normal limits  CBC - Abnormal; Notable for the following:    RBC 3.68 (*)     Hemoglobin 10.7 (*)     HCT 31.8 (*)     RDW 15.7 (*)     Platelets 407 (*)     All other components within  normal limits  URINALYSIS, ROUTINE W REFLEX MICROSCOPIC - Abnormal; Notable for the following:    Color, Urine AMBER (*)  BIOCHEMICALS MAY BE AFFECTED BY COLOR   APPearance CLOUDY (*)     Specific Gravity, Urine 1.032 (*)     Hgb urine dipstick TRACE (*)     Bilirubin Urine SMALL (*)     Ketones, ur TRACE (*)     All other components within normal limits  HEMOGLOBIN A1C - Abnormal; Notable for the following:    Hemoglobin A1C 6.1 (*)     Mean Plasma Glucose 128 (*)     All other components within normal limits  BODY FLUID CELL COUNT WITH DIFFERENTIAL - Abnormal; Notable for the following:    Appearance, Fluid CLOUDY (*)     WBC, Fluid 1140 (*)     Monocyte-Macrophage-Serous Fluid 24 (*)     All other components within normal limits  LACTATE DEHYDROGENASE, BODY FLUID - Abnormal; Notable for the following:    LD, Fluid 1118 (*)     All other components within normal limits  VITAMIN B12 - Abnormal; Notable for the following:    Vitamin B-12 206 (*)     All other components within normal limits  IRON AND TIBC - Abnormal; Notable for the following:    Iron 23 (*)     TIBC 173 (*)     Saturation Ratios 13 (*)     All other components within normal limits  RETICULOCYTES - Abnormal; Notable for the following:    RBC. 3.48 (*)     All other components within normal limits  GLUCOSE, CAPILLARY - Abnormal; Notable for the following:    Glucose-Capillary 59 (*)     All other components within normal limits  GLUCOSE, CAPILLARY - Abnormal; Notable for the following:    Glucose-Capillary 113 (*)     All other components within normal limits  GLUCOSE, CAPILLARY - Abnormal; Notable for the following:    Glucose-Capillary 122 (*)     All other components within normal limits  RENAL FUNCTION PANEL - Abnormal; Notable for the following:    CO2 18 (*)     Glucose, Bld 121 (*)     BUN 68 (*)     Creatinine, Ser 8.21 (*)     Calcium 7.5 (*)     Phosphorus 6.8 (*)  Albumin 2.1 (*)     GFR  calc non Af Amer 4 (*)     GFR calc Af Amer 5 (*)     All other components within normal limits  GLUCOSE, CAPILLARY - Abnormal; Notable for the following:    Glucose-Capillary 132 (*)     All other components within normal limits  GLUCOSE, CAPILLARY - Abnormal; Notable for the following:    Glucose-Capillary 142 (*)     All other components within normal limits  GLUCOSE, CAPILLARY - Abnormal; Notable for the following:    Glucose-Capillary 129 (*)     All other components within normal limits  BASIC METABOLIC PANEL - Abnormal; Notable for the following:    Sodium 131 (*)     CO2 18 (*)     Glucose, Bld 117 (*)     BUN 66 (*)     Creatinine, Ser 7.29 (*)     Calcium 7.4 (*)     GFR calc non Af Amer 5 (*)     GFR calc Af Amer 6 (*)     All other components within normal limits  GLUCOSE, CAPILLARY - Abnormal; Notable for the following:    Glucose-Capillary 107 (*)     All other components within normal limits  GLUCOSE, CAPILLARY - Abnormal; Notable for the following:    Glucose-Capillary 121 (*)     All other components within normal limits  GLUCOSE, CAPILLARY - Abnormal; Notable for the following:    Glucose-Capillary 126 (*)     All other components within normal limits  RENAL FUNCTION PANEL - Abnormal; Notable for the following:    BUN 60 (*)     Creatinine, Ser 5.92 (*)     Calcium 7.5 (*)     Phosphorus 5.0 (*)     Albumin 1.7 (*)     GFR calc non Af Amer 7 (*)     GFR calc Af Amer 8 (*)     All other components within normal limits  GLUCOSE, CAPILLARY - Abnormal; Notable for the following:    Glucose-Capillary 65 (*)     All other components within normal limits  GLUCOSE, CAPILLARY - Abnormal; Notable for the following:    Glucose-Capillary 66 (*)     All other components within normal limits  GLUCOSE, CAPILLARY - Abnormal; Notable for the following:    Glucose-Capillary 104 (*)     All other components within normal limits  GLUCOSE, CAPILLARY - Abnormal; Notable  for the following:    Glucose-Capillary 101 (*)     All other components within normal limits  CBC - Abnormal; Notable for the following:    WBC 3.5 (*)     RBC 3.40 (*)     Hemoglobin 9.9 (*)     HCT 30.0 (*)     RDW 16.1 (*)     All other components within normal limits  GLUCOSE, CAPILLARY - Abnormal; Notable for the following:    Glucose-Capillary 112 (*)     All other components within normal limits  GLUCOSE, CAPILLARY - Abnormal; Notable for the following:    Glucose-Capillary 112 (*)     All other components within normal limits  RENAL FUNCTION PANEL - Abnormal; Notable for the following:    BUN 48 (*)     Creatinine, Ser 3.85 (*)  DELTA CHECK NOTED   Calcium 8.3 (*)     Albumin 1.8 (*)     GFR calc non Af Amer 11 (*)  GFR calc Af Amer 13 (*)     All other components within normal limits  RENAL FUNCTION PANEL - Abnormal; Notable for the following:    Glucose, Bld 113 (*)     BUN 46 (*)     Creatinine, Ser 3.55 (*)     Albumin 2.0 (*)     GFR calc non Af Amer 12 (*)     GFR calc Af Amer 14 (*)     All other components within normal limits  GLUCOSE, CAPILLARY - Abnormal; Notable for the following:    Glucose-Capillary 114 (*)     All other components within normal limits  RENAL FUNCTION PANEL - Abnormal; Notable for the following:    Chloride 114 (*)     BUN 36 (*)     Creatinine, Ser 2.57 (*)     Calcium 8.1 (*)     Albumin 1.8 (*)     GFR calc non Af Amer 18 (*)     GFR calc Af Amer 21 (*)     All other components within normal limits  GLUCOSE, CAPILLARY - Abnormal; Notable for the following:    Glucose-Capillary 120 (*)     All other components within normal limits  GLUCOSE, CAPILLARY - Abnormal; Notable for the following:    Glucose-Capillary 109 (*)     All other components within normal limits  GLUCOSE, CAPILLARY - Abnormal; Notable for the following:    Glucose-Capillary 112 (*)     All other components within normal limits  BASIC METABOLIC PANEL -  Abnormal; Notable for the following:    Chloride 113 (*)     BUN 26 (*)     Creatinine, Ser 1.73 (*)     Calcium 8.0 (*)     GFR calc non Af Amer 29 (*)     GFR calc Af Amer 34 (*)     All other components within normal limits  GLUCOSE, CAPILLARY - Abnormal; Notable for the following:    Glucose-Capillary 128 (*)     All other components within normal limits  GLUCOSE, CAPILLARY - Abnormal; Notable for the following:    Glucose-Capillary 147 (*)     All other components within normal limits  GLUCOSE, CAPILLARY - Abnormal; Notable for the following:    Glucose-Capillary 138 (*)     All other components within normal limits  MAGNESIUM  URINE MICROSCOPIC-ADD ON  SODIUM, URINE, RANDOM  CALCIUM / CREATININE RATIO, URINE  GLUCOSE, CAPILLARY  GLUCOSE, CAPILLARY  GLUCOSE, CAPILLARY  BODY FLUID CULTURE  CYTOLOGY - NON PAP  GLUCOSE, SEROUS FLUID  PROTEIN, BODY FLUID  AMYLASE, BODY FLUID  UREA NITROGEN, URINE  FOLATE  FERRITIN  LAB REPORT - SCANNED  METHYLMALONIC ACID, SERUM  PATHOLOGIST SMEAR REVIEW  GLUCOSE, CAPILLARY  GLUCOSE, CAPILLARY  CORTISOL-AM, BLOOD  GLUCOSE, CAPILLARY  GLUCOSE, CAPILLARY  GLUCOSE, CAPILLARY   No results found.  EKG:  Rhythm: normal sinus Vent. rate 94 BPM PR interval 152 ms QRS duration 82 ms QT/QTc 324/405 ms P-R-T axes 46 -4 10 Axis: normal Intervals: poor r wave progression ST segments: NS ST changes    1. Renal failure   2. Ascites   3. ARF (acute renal failure)   4. Diabetes mellitus   5. Ovarian mass   6. Hyperlipidemia   7. Hypertension       MDM  [age]-year-old female with acute renal failure. Recent diagnosis of pelvic mass is currently being worked up. Lower car admission the hospital for  further treatment and evaluation. No indication for emergent dialysis. IV hydration admission.      Raeford Razor, MD 02/21/12 1610  Raeford Razor, MD 03/16/12 [phone]

## 2012-02-23 ENCOUNTER — Telehealth: Payer: Self-pay

## 2012-02-23 NOTE — Telephone Encounter (Signed)
Attempted to reach Ms. Petron at home number listed [phone].   There is no answer and no messaging system set up.  Tried to reach sister and daughter.  Left a message in Dtr. Kelly's vm at [phone] reminding her to make sure that her mother takes the decadron pre med for taxol chemotherapy  at 0100 02-24-12 with food.  Will review anti-nausea meds. at treatment tomorrow.

## 2012-02-24 ENCOUNTER — Ambulatory Visit (HOSPITAL_BASED_OUTPATIENT_CLINIC_OR_DEPARTMENT_OTHER): Payer: Medicare Other | Admitting: Lab

## 2012-02-24 ENCOUNTER — Ambulatory Visit (HOSPITAL_BASED_OUTPATIENT_CLINIC_OR_DEPARTMENT_OTHER): Payer: Medicare Other

## 2012-02-24 VITALS — BP 110/64 | HR 96 | Temp 97.1°F | Resp 18

## 2012-02-24 DIAGNOSIS — E119 Type 2 diabetes mellitus without complications: Secondary | ICD-10-CM

## 2012-02-24 DIAGNOSIS — C569 Malignant neoplasm of unspecified ovary: Secondary | ICD-10-CM

## 2012-02-24 DIAGNOSIS — Z5111 Encounter for antineoplastic chemotherapy: Secondary | ICD-10-CM

## 2012-02-24 LAB — BASIC METABOLIC PANEL
CO2: 22 mEq/L (ref 19–32)
Chloride: 104 mEq/L (ref 96–112)
Creatinine, Ser: 1.07 mg/dL (ref 0.50–1.10)
Potassium: 5.1 mEq/L (ref 3.5–5.3)
Sodium: 137 mEq/L (ref 135–145)

## 2012-02-24 LAB — CBC WITH DIFFERENTIAL/PLATELET
Basophils Absolute: 0 10*3/uL (ref 0.0–0.1)
EOS%: 0 % (ref 0.0–7.0)
Eosinophils Absolute: 0 10*3/uL (ref 0.0–0.5)
HCT: 30.7 % — ABNORMAL LOW (ref 34.8–46.6)
HGB: 10 g/dL — ABNORMAL LOW (ref 11.6–15.9)
LYMPH%: 8.2 % — ABNORMAL LOW (ref 14.0–49.7)
MCH: 29 pg (ref 25.1–34.0)
MCV: 89 fL (ref 79.5–101.0)
MONO%: 0.7 % (ref 0.0–14.0)
NEUT#: 5.4 10*3/uL (ref 1.5–6.5)
NEUT%: 91.1 % — ABNORMAL HIGH (ref 38.4–76.8)
Platelets: 434 10*3/uL — ABNORMAL HIGH (ref 145–400)
RDW: 16.7 % — ABNORMAL HIGH (ref 11.2–14.5)

## 2012-02-24 LAB — WHOLE BLOOD GLUCOSE: Glucose: 326 mg/dL — ABNORMAL HIGH (ref 70–100)

## 2012-02-24 MED ORDER — INSULIN REGULAR HUMAN 100 UNIT/ML IJ SOLN
2.0000 [IU] | Freq: Once | INTRAMUSCULAR | Status: AC
  Administered 2012-02-24: 2 [IU] via SUBCUTANEOUS
  Filled 2012-02-24: qty 0.02

## 2012-02-24 MED ORDER — FAMOTIDINE IN NACL 20-0.9 MG/50ML-% IV SOLN
20.0000 mg | Freq: Once | INTRAVENOUS | Status: AC
  Administered 2012-02-24: 20 mg via INTRAVENOUS

## 2012-02-24 MED ORDER — DEXAMETHASONE SODIUM PHOSPHATE 4 MG/ML IJ SOLN
20.0000 mg | Freq: Once | INTRAMUSCULAR | Status: AC
  Administered 2012-02-24: 20 mg via INTRAVENOUS

## 2012-02-24 MED ORDER — SODIUM CHLORIDE 0.9 % IV SOLN
Freq: Once | INTRAVENOUS | Status: AC
  Administered 2012-02-24: 14:00:00 via INTRAVENOUS

## 2012-02-24 MED ORDER — PACLITAXEL CHEMO INJECTION 300 MG/50ML
80.0000 mg/m2 | Freq: Once | INTRAVENOUS | Status: AC
  Administered 2012-02-24: 156 mg via INTRAVENOUS
  Filled 2012-02-24: qty 26

## 2012-02-24 MED ORDER — DIPHENHYDRAMINE HCL 50 MG/ML IJ SOLN
50.0000 mg | Freq: Once | INTRAMUSCULAR | Status: AC
  Administered 2012-02-24: 50 mg via INTRAVENOUS

## 2012-02-24 NOTE — Patient Instructions (Addendum)
[facility] Discharge Instructions for Patients Receiving Chemotherapy  Today you received the following chemotherapy agents TAXOL  To help prevent nausea and vomiting after your treatment, we encourage you to take your nausea medication and take it as often as prescribed   If you develop nausea and vomiting that is not controlled by your nausea medication, call the clinic. If it is after clinic hours your family physician or the after hours number for the clinic or go to the Emergency Department.  1)  ATIVAN NIGHT OF CHEMO 2)  ONDANSETRON (ZOFRAN) AM AFTER CHEMO 3)  USE NAUSEA MEDICATION AS NEEDED AS DIRECTED AFTER THIS  BELOW ARE SYMPTOMS THAT SHOULD BE REPORTED IMMEDIATELY:  *FEVER GREATER THAN 100.5 F  *CHILLS WITH OR WITHOUT FEVER  NAUSEA AND VOMITING THAT IS NOT CONTROLLED WITH YOUR NAUSEA MEDICATION  *UNUSUAL SHORTNESS OF BREATH  *UNUSUAL BRUISING OR BLEEDING  TENDERNESS IN MOUTH AND THROAT WITH OR WITHOUT PRESENCE OF ULCERS  *URINARY PROBLEMS  *BOWEL PROBLEMS  UNUSUAL RASH Items with * indicate a potential emergency and should be followed up as soon as possible.  One of the nurses will contact you 24 hours after your treatment. Please let the nurse know about any problems that you may have experienced. Feel free to call the clinic you have any questions or concerns. The clinic phone number is [phone].   I have been informed and understand all the instructions given to me. I know to contact the clinic, my physician, or go to the Emergency Department if any problems should occur. I do not have any questions at this time, but understand that I may call the clinic during office hours   should I have any questions or need assistance in obtaining follow up care.    __________________________________________  _____________  __________ Signature of Patient or Authorized Representative            Date                    Time    __________________________________________ Nurse's Signature

## 2012-02-24 NOTE — Progress Notes (Signed)
Tolerated 1st taxol well, no complaints voiced./  dmr

## 2012-02-25 ENCOUNTER — Telehealth: Payer: Self-pay | Admitting: Gynecologic Oncology

## 2012-02-25 ENCOUNTER — Telehealth: Payer: Self-pay | Admitting: *Deleted

## 2012-02-25 NOTE — Telephone Encounter (Signed)
Patient's daughter Tresa Endo called reporting "Ann Baldwin's blood sugar = 460 at 2:00 pm.  It's never been this high.  She was taken off her diabetic medicine and fluid pills and now her feet are swollen and her blood sugar is high.  She just ate a bacon biscuit".  Asked if she checks her blood sugar daily and how many times a day but says she only checks her blood sugar some of the time.  Reports Dr. Nelly Rout took her off diabetic medicine because she is not eating and is malnourished.  Instructed to contact Dr. Nelly Rout and be sure to tell them her blood sugar and that she takes dexamethasone now and needs to know how to proceed to manage her blood sugar.  Daughter has the PA's pager number and will page now.

## 2012-02-25 NOTE — Telephone Encounter (Signed)
Arvin Collard, daughter in law, called stating that her mother-in-law's CBG was 460.  Dr. Nelly Rout notified and stated the patient will need to restart her Januvia but she must follow up with her primary care in the next couple of days to monitor her blood sugar and kidney function.  Instructed to go to the emergency room if her blood sugar will not begin to decrease.  Reportable signs and symptoms reviewed.  Stating that the patient is to see Dr. Jacqulyn Bath, primary care physician at [facility], as a new patient since they were switching primary care providers for personal reasons.  Appointment arranged for Aug 23 at 1:40pm with Dr. Jacqulyn Bath.  Instructed to monitor blood sugar frequently at home.

## 2012-02-26 ENCOUNTER — Other Ambulatory Visit: Payer: Self-pay

## 2012-02-26 DIAGNOSIS — C569 Malignant neoplasm of unspecified ovary: Secondary | ICD-10-CM

## 2012-02-26 MED ORDER — INSULIN REGULAR HUMAN 100 UNIT/ML IJ SOLN: INTRAMUSCULAR | Status: DC

## 2012-02-26 NOTE — Progress Notes (Signed)
Spoke with Dtr.-in-law Tresa Endo. [phone].  Told her that Dr. Darrold Span feels that Sliding scale Insulin coverage for blood sugars would be better than oral agents with her recent poor kidney function. Pt. Took Januvia yesterday and today.  Will stop [place]  and start PRN  insulin coverage tomorrow.  Adult  Granddaughter can give insulin.  She will be shown by the pharmacist how to draw up insulin. See medication list for SS. BS today  137 am and 3 pm 161.  Discussed following a diabetic diet and drinking a lot of unsweetened fluids especially if BS is high.   This insulin coverage will be until she establishes new PCP on 03-04-12.

## 2012-02-28 ENCOUNTER — Other Ambulatory Visit: Payer: Self-pay | Admitting: Oncology

## 2012-02-29 ENCOUNTER — Encounter: Payer: Self-pay | Admitting: Oncology

## 2012-02-29 ENCOUNTER — Telehealth: Payer: Self-pay | Admitting: Oncology

## 2012-02-29 ENCOUNTER — Other Ambulatory Visit: Payer: Self-pay

## 2012-02-29 ENCOUNTER — Ambulatory Visit (HOSPITAL_BASED_OUTPATIENT_CLINIC_OR_DEPARTMENT_OTHER): Payer: Medicare Other

## 2012-02-29 ENCOUNTER — Other Ambulatory Visit (HOSPITAL_BASED_OUTPATIENT_CLINIC_OR_DEPARTMENT_OTHER): Payer: Medicare Other | Admitting: Lab

## 2012-02-29 ENCOUNTER — Ambulatory Visit (HOSPITAL_BASED_OUTPATIENT_CLINIC_OR_DEPARTMENT_OTHER): Payer: Medicare Other | Admitting: Oncology

## 2012-02-29 VITALS — BP 90/60 | HR 100 | Temp 96.8°F | Resp 20 | Ht 66.34 in | Wt 162.9 lb

## 2012-02-29 VITALS — BP 117/65 | HR 90 | Temp 97.2°F | Resp 20

## 2012-02-29 DIAGNOSIS — C569 Malignant neoplasm of unspecified ovary: Secondary | ICD-10-CM

## 2012-02-29 DIAGNOSIS — E119 Type 2 diabetes mellitus without complications: Secondary | ICD-10-CM

## 2012-02-29 DIAGNOSIS — R110 Nausea: Secondary | ICD-10-CM

## 2012-02-29 DIAGNOSIS — N179 Acute kidney failure, unspecified: Secondary | ICD-10-CM

## 2012-02-29 LAB — CBC WITH DIFFERENTIAL/PLATELET
Basophils Absolute: 0 10*3/uL (ref 0.0–0.1)
EOS%: 2.9 % (ref 0.0–7.0)
Eosinophils Absolute: 0.1 10*3/uL (ref 0.0–0.5)
HGB: 10.2 g/dL — ABNORMAL LOW (ref 11.6–15.9)
NEUT#: 4.2 10*3/uL (ref 1.5–6.5)
RDW: 16.2 % — ABNORMAL HIGH (ref 11.2–14.5)
lymph#: 0.4 10*3/uL — ABNORMAL LOW (ref 0.9–3.3)

## 2012-02-29 LAB — BASIC METABOLIC PANEL
BUN: 18 mg/dL (ref 6–23)
Chloride: 103 mEq/L (ref 96–112)
Glucose, Bld: 117 mg/dL — ABNORMAL HIGH (ref 70–99)
Potassium: 4.4 mEq/L (ref 3.5–5.3)
Sodium: 140 mEq/L (ref 135–145)

## 2012-02-29 MED ORDER — SODIUM CHLORIDE 0.9 % IV SOLN
INTRAVENOUS | Status: DC
  Administered 2012-02-29: 11:00:00 via INTRAVENOUS

## 2012-02-29 MED ORDER — DEXAMETHASONE 4 MG PO TABS: ORAL_TABLET | ORAL | Status: DC

## 2012-02-29 NOTE — Progress Notes (Signed)
Patient reports that she is 'feeling better'; no signs of symptoms of nausea or dizziness

## 2012-02-29 NOTE — Patient Instructions (Addendum)
Take miralax 1 capful in any liquid when you get home and repeat late afternoon today if still no bowel movement. You should use this every day, once or twice a day, to keep bowels moving daily. Call Dr Precious Reel nurse tomorrow if still no bowel movement.   Chemo will be on 03-02-12 at 1:15. You need to take decadron (dexamethasone, steroid) with some food about 1 AM on 8-21 (=12 hrs before chemo). You will do only one dose of the steroids before chemo for now.  Diet drinks are ok

## 2012-02-29 NOTE — Patient Instructions (Addendum)
Dr Darrold Span would like for you to stop taking Lotrel 10-20mg  (Blood pressure medication)   Dehydration, Adult Dehydration means your body does not have as much fluid as it needs. Your kidneys, brain, and heart will not work properly without the right amount of fluids and salt.  HOME CARE  Ask your doctor how to replace body fluid losses (rehydrate).   Drink enough fluids to keep your pee (urine) clear or pale yellow.   Drink small amounts of fluids often if you feel sick to your stomach (nauseous) or throw up (vomit).   Eat like you normally do.   Avoid:   Foods or drinks high in sugar.   Bubbly (carbonated) drinks.   Juice.   Very hot or cold fluids.   Drinks with caffeine.   Fatty, greasy foods.   Alcohol.   Tobacco.   Eating too much.   Gelatin desserts.   Wash your hands to avoid spreading germs (bacteria, viruses).   Only take medicine as told by your doctor.   Keep all doctor visits as told.  GET HELP RIGHT AWAY IF:   You cannot drink something without throwing up.   You get worse even with treatment.   Your vomit has blood in it or looks greenish.   Your poop (stool) has blood in it or looks black and tarry.   You have not peed in 6 to 8 hours.   You pee a small amount of very dark pee.   You have a fever.   You pass out (faint).   You have belly (abdominal) pain that gets worse or stays in one spot (localizes).   You have a rash, stiff neck, or bad headache.   You get easily annoyed, sleepy, or are hard to wake up.   You feel weak, dizzy, or very thirsty.  MAKE SURE YOU:   Understand these instructions.   Will watch your condition.   Will get help right away if you are not doing well or get worse.  Document Released: 04/25/2009 Document Revised: 06/18/2011 Document Reviewed: 02/16/2011 [facility] Patient Information 2012 [place], [place].

## 2012-02-29 NOTE — Progress Notes (Signed)
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 She is not able to have platin drugs now due to recent acute renal failure.  Patient had presented with GI symptoms thought IBD in April 2013. She had colonoscopy by Dr Stan Head 01-26-12, with large ulcer in ascending colon, this difficult to visualize and to biopsy. Pathology [id number] found adenocarcinoma with immunohistochemistry consistent with noncolonic primary. CT AP (with contrast) in [facility] system 02-02-12 had complex solid and cystic left adnexal mass, right ovarian mass, omental caking and peritoneal carcinomatosis. She was seen by Dr Nelly Rout on 02-09-12, with CA 125  22,938 and CEA 0.5; CMET resulted after Dr Forrestine Him visit had BUN 63 and creatinine 8.78. She was admitted to [facility] 7-31 thru 02-21-12 with acute renal failure felt multifactorial including prerenal with N/V/diarrhea/ascites, NSAID and ACE inhibitors, lasix, IV contrast, HTN and DM. Renal function progressively improved; she does not have follow up planned with nephrology outpatient. She had paracentesis of 5 liters on 02-11-12 and 2.4 liters on 02-18-12. Repeat CT without contrast 02-11-12 had no change in adnexal masses, peritoneal or omental disease, no bowel obstruction, some bilateral pleural fluid. She also has diabetes and history of hypertension; she is to be seen by new PCP on 03-04-12.  Patient had significant elevation in blood sugars from steroids necessary for taxol chemotherapy, including 460 day after chemo. She resumed Januvia on 8-15,  but we have changed this to SS regular insulin due to recent renal problems.  She has been more constipated, last BM 02-25-12, but has not used laxatives. She has had little  nausea, but was nauseated abruptly on arrival to office today, with BP then 77/52. Nausea has improved with oral antiemetic and BP also better now; I have told patient to stop Lotrel for now. Abdomen has been less full (even with constipation). She has been able to take po fluids, is voiding easily, denies increased shortness of breath or abdominal pain. She denies bleeding or fever/ symptoms of infection. She denies peripheral neuropathy symptoms. Blood sugars yesterday was 96 and this AM 120. I expect that she will need the SS insulin only around steroids used for the chemotherapy. Remainder of 10 point Review of Systems negative.  Objective:  Vital signs in last 24 hours:  BP 90/60  Pulse 100  Temp 96.8 F (36 C) (Oral)  Resp 20  Ht 5' 6.34" (1.685 m)  Wt 162 lb 14.4 oz (73.891 kg)  BMI 26.02 kg/m2 See vitals flowsheets for additional results. This weight is down 14 lbs from 02-19-12. Alert and appropriate, tho uncomfortable from nausea during my exam, seen in WC only.   HEENT:PERRLA, sclera clear, anicteric, oropharynx clear, no lesions and neck supple with midline trachea. No alopecia LymphaticsCervical, supraclavicular, and axillary nodes normal. Resp: diminished breath sounds just in bases bilaterally, otherwise clear. Cardio: regular rate and rhythm, no gallop, clear heart sounds GI: less distended, soft, not tender, few bowel sounds Extremities: no pitting edema LE, no cords or tenderness. No problems at peripheral IV site used for treatment Neuro:no sensory deficits noted Skin somewhat pale, not icteric, no rash or petechiae  Lab Results:  Results for orders placed in visit on 02/29/12  CBC WITH DIFFERENTIAL      Component Value Range  WBC 4.8  3.9 - 10.3 10e3/uL   NEUT# 4.2  1.5 - 6.5 10e3/uL   HGB 10.2 (*) 11.6 - 15.9 g/dL   HCT 11.9 (*) 14.7 - 82.9 %   Platelets 375  145 - 400 10e3/uL   MCV 87.5  79.5 - 101.0 fL   MCH 28.3  25.1 - 34.0 pg   MCHC 32.4  31.5 - 36.0  g/dL   RBC 5.62 (*) 1.30 - 8.65 10e6/uL   RDW 16.2 (*) 11.2 - 14.5 %   lymph# 0.4 (*) 0.9 - 3.3 10e3/uL   MONO# 0.1  0.1 - 0.9 10e3/uL   Eosinophils Absolute 0.1  0.0 - 0.5 10e3/uL   Basophils Absolute 0.0  0.0 - 0.1 10e3/uL   NEUT% 86.8 (*) 38.4 - 76.8 %   LYMPH% 8.6 (*) 14.0 - 49.7 %   MONO% 1.7  0.0 - 14.0 %   EOS% 2.9  0.0 - 7.0 %   BASO% 0.0  0.0 - 2.0 %  BASIC METABOLIC PANEL      Component Value Range   Sodium 140  135 - 145 mEq/L   Potassium 4.4  3.5 - 5.3 mEq/L   Chloride 103  96 - 112 mEq/L   CO2 28  19 - 32 mEq/L   Glucose, Bld 117 (*) 70 - 99 mg/dL   BUN 18  6 - 23 mg/dL   Creatinine, Ser 7.84  0.50 - 1.10 mg/dL   Calcium 8.8  8.4 - 69.6 mg/dL     Studies/Results:  No results found.  Medications: I have reviewed the patient's current medications. She is off Januvia and will hold Lotrel. SS regular insulin as instructed.She will use miralax today and will let us know tomorrow if still no BM. Patient was given IV NS after my visit, with improvement in BP and nausea resolved.  Assessment/Plan: 1.Gyn adenocarcinoma presenting with adnexal masses, omental caking, malignant ascites and invasion into bowel, now receiving neoadjuvant taxol which we will try day 1 and day8 q 21 days, with day 8 cycle 1 on 03-02-12. She will use premed decadron 20 mg 12 hours prior to taxol (will not use 6 hr prior dose additionally) + IV coverage with chemotherapy. We will check blood sugar here and cover with insulin here depending on results. I will see her on 03-09-12 and 03-15-12, and she will be due day 1 cycle 2 on 03-16-12. She has had no gCSF thus far. Next gyn oncology appointment is scheduled 05-19-12. 2.diabetes: plan as above for now 3.acute multifactorial renal failure just after diagnosis of the gyn cancer: renal function much improved today, however I do not feel situation is stable enough to try adding platin drugs yet. 4.history of HTN: BPs now low, hold Lotrel.   Patient lives  about an hour away, and certainly we will try to decrease frequency of visits here when possible, however family is very willing to bring her now.   Roslind Michaux P, MD   02/29/2012, 1:40 PM

## 2012-02-29 NOTE — Telephone Encounter (Signed)
appts made and printed for pt aom °

## 2012-03-02 ENCOUNTER — Other Ambulatory Visit (HOSPITAL_BASED_OUTPATIENT_CLINIC_OR_DEPARTMENT_OTHER): Payer: Medicare Other | Admitting: Lab

## 2012-03-02 ENCOUNTER — Ambulatory Visit (HOSPITAL_BASED_OUTPATIENT_CLINIC_OR_DEPARTMENT_OTHER): Payer: Medicare Other

## 2012-03-02 VITALS — BP 125/75 | HR 99 | Temp 97.7°F | Resp 20

## 2012-03-02 DIAGNOSIS — E119 Type 2 diabetes mellitus without complications: Secondary | ICD-10-CM

## 2012-03-02 DIAGNOSIS — C569 Malignant neoplasm of unspecified ovary: Secondary | ICD-10-CM

## 2012-03-02 DIAGNOSIS — Z5111 Encounter for antineoplastic chemotherapy: Secondary | ICD-10-CM

## 2012-03-02 LAB — CBC WITH DIFFERENTIAL/PLATELET
BASO%: 0 % (ref 0.0–2.0)
Eosinophils Absolute: 0 10*3/uL (ref 0.0–0.5)
LYMPH%: 6.7 % — ABNORMAL LOW (ref 14.0–49.7)
MCHC: 33.1 g/dL (ref 31.5–36.0)
MONO#: 0 10*3/uL — ABNORMAL LOW (ref 0.1–0.9)
NEUT#: 4.2 10*3/uL (ref 1.5–6.5)
Platelets: 388 10*3/uL (ref 145–400)
RBC: 3.39 10*6/uL — ABNORMAL LOW (ref 3.70–5.45)
WBC: 4.5 10*3/uL (ref 3.9–10.3)
lymph#: 0.3 10*3/uL — ABNORMAL LOW (ref 0.9–3.3)
nRBC: 0 % (ref 0–0)

## 2012-03-02 LAB — WHOLE BLOOD GLUCOSE
Glucose: 323 mg/dL — ABNORMAL HIGH (ref 70–100)
HRS PC: 2.5 Hours

## 2012-03-02 MED ORDER — INSULIN REGULAR HUMAN 100 UNIT/ML IJ SOLN
2.0000 [IU] | Freq: Once | INTRAMUSCULAR | Status: AC
  Administered 2012-03-02: 2 [IU] via SUBCUTANEOUS
  Filled 2012-03-02: qty 0.02

## 2012-03-02 MED ORDER — DEXAMETHASONE SODIUM PHOSPHATE 4 MG/ML IJ SOLN
20.0000 mg | Freq: Once | INTRAMUSCULAR | Status: AC
  Administered 2012-03-02: 20 mg via INTRAVENOUS

## 2012-03-02 MED ORDER — FAMOTIDINE IN NACL 20-0.9 MG/50ML-% IV SOLN
20.0000 mg | Freq: Once | INTRAVENOUS | Status: AC
  Administered 2012-03-02: 20 mg via INTRAVENOUS

## 2012-03-02 MED ORDER — DIPHENHYDRAMINE HCL 50 MG/ML IJ SOLN
50.0000 mg | Freq: Once | INTRAMUSCULAR | Status: AC
  Administered 2012-03-02: 50 mg via INTRAVENOUS

## 2012-03-02 MED ORDER — SODIUM CHLORIDE 0.9 % IV SOLN
Freq: Once | INTRAVENOUS | Status: AC
  Administered 2012-03-02: 14:00:00 via INTRAVENOUS

## 2012-03-02 MED ORDER — PACLITAXEL CHEMO INJECTION 300 MG/50ML
80.0000 mg/m2 | Freq: Once | INTRAVENOUS | Status: AC
  Administered 2012-03-02: 156 mg via INTRAVENOUS
  Filled 2012-03-02: qty 26

## 2012-03-03 ENCOUNTER — Telehealth: Payer: Self-pay | Admitting: *Deleted

## 2012-03-03 NOTE — Telephone Encounter (Signed)
PLEASE FAX DR.LIVESAY'S 02/29/12 DICTATION TO DR.LONG AT [facility]. CALLED [facility] AT PHONE #[phone]. PT. HAS AN APPOINTMENT WITH DR.JOSHUA LONG ON 03/04/12. FAXED DR.LIVESAY'S 02/29/12 DICTATION TO DR.LONG'S OFFICE. THE FAX NUMBER IS #[phone]. CALLED DR.LONG'S OFFICE. SPOKE TO STEPHANIE. FAX WAS RECEIVED.

## 2012-03-04 ENCOUNTER — Encounter: Payer: Self-pay | Admitting: Dietician

## 2012-03-04 NOTE — Progress Notes (Signed)
Brief Out-patient Oncology Nutrition Note  Reason: Patient Screened Positive For Nutrition Risk For Unintentional Weight Loss and Decreased Appetite.   Ann Baldwin is a 66 year old female patient of Ann Baldwin, diagnosed with ovarian cancer. Attempted to contact patient via telephone for positive nutrition risk. Left patient coice mail with RD contact information.   Wt Readings from Last 10 Encounters:  02/29/12 162 lb 14.4 oz (73.891 kg)  02/19/12 176 lb 12.8 oz (80.196 kg)  02/18/12 174 lb 11.2 oz (79.243 kg)  02/16/12 173 lb 4.5 oz (78.6 kg)  02/09/12 168 lb 8 oz (76.431 kg)  01/26/12 169 lb (76.658 kg)  01/12/12 169 lb 12.8 oz (77.021 kg)    RD available for nutrition needs.   Iven Finn, MS, RD, LDN [phone]

## 2012-03-07 ENCOUNTER — Telehealth: Payer: Self-pay | Admitting: Oncology

## 2012-03-07 ENCOUNTER — Telehealth: Payer: Self-pay | Admitting: *Deleted

## 2012-03-07 ENCOUNTER — Other Ambulatory Visit: Payer: Self-pay | Admitting: *Deleted

## 2012-03-07 DIAGNOSIS — C569 Malignant neoplasm of unspecified ovary: Secondary | ICD-10-CM

## 2012-03-07 MED ORDER — LORAZEPAM 0.5 MG PO TABS: ORAL_TABLET | ORAL | Status: DC

## 2012-03-07 NOTE — Telephone Encounter (Signed)
Pt's daughter in law called, Deva Ron, cancelled appt for 8/28, nurse notified

## 2012-03-07 NOTE — Telephone Encounter (Signed)
Per staff message and POF I have scheduled appt.  JMW  

## 2012-03-08 ENCOUNTER — Telehealth: Payer: Self-pay | Admitting: *Deleted

## 2012-03-08 NOTE — Telephone Encounter (Signed)
Left message for pt to call Dr Precious Reel RN, Phil Dopp. Dr Darrold Span wanting to see how she is feeling..BP, blood sugar, nausea and bowels.

## 2012-03-08 NOTE — Telephone Encounter (Signed)
Called patient for follow up as she cancelled her appointment for 03/09/12 with Dr Darrold Span due to transportation issues.  Blood pressure: patient states that she does not have a way to check BP, but is "feeling OK, better than the day she needed fluid". Confirmed she had stopped taking Lotrel and Januvia per Dr Precious Reel instructions. Blood sugar: is "real good" 8/27 am: 103, 8/26 evening: 135. Nausea: "no problems, feeling better" Bowels: "under control, no longer constipated".  Pt to see Dr Darrold Span on 03/15/12.

## 2012-03-09 ENCOUNTER — Other Ambulatory Visit: Payer: Medicare Other | Admitting: Lab

## 2012-03-09 ENCOUNTER — Ambulatory Visit: Payer: Medicare Other | Admitting: Oncology

## 2012-03-10 ENCOUNTER — Telehealth: Payer: Self-pay | Admitting: Oncology

## 2012-03-10 NOTE — Telephone Encounter (Signed)
Talked to patient's grandaughter she is aware of all appts for September 2013 labs, chemo and MD

## 2012-03-13 ENCOUNTER — Other Ambulatory Visit: Payer: Self-pay | Admitting: Oncology

## 2012-03-14 ENCOUNTER — Other Ambulatory Visit: Payer: Self-pay | Admitting: Oncology

## 2012-03-15 ENCOUNTER — Other Ambulatory Visit (HOSPITAL_BASED_OUTPATIENT_CLINIC_OR_DEPARTMENT_OTHER): Payer: Medicare Other | Admitting: Lab

## 2012-03-15 ENCOUNTER — Ambulatory Visit (HOSPITAL_BASED_OUTPATIENT_CLINIC_OR_DEPARTMENT_OTHER): Payer: Medicare Other | Admitting: Oncology

## 2012-03-15 ENCOUNTER — Telehealth: Payer: Self-pay | Admitting: Oncology

## 2012-03-15 ENCOUNTER — Encounter: Payer: Self-pay | Admitting: Oncology

## 2012-03-15 VITALS — BP 148/83 | HR 93 | Temp 98.6°F | Resp 18 | Ht 63.0 in | Wt 143.8 lb

## 2012-03-15 DIAGNOSIS — C569 Malignant neoplasm of unspecified ovary: Secondary | ICD-10-CM

## 2012-03-15 DIAGNOSIS — C7970 Secondary malignant neoplasm of unspecified adrenal gland: Secondary | ICD-10-CM

## 2012-03-15 DIAGNOSIS — N19 Unspecified kidney failure: Secondary | ICD-10-CM

## 2012-03-15 DIAGNOSIS — E119 Type 2 diabetes mellitus without complications: Secondary | ICD-10-CM

## 2012-03-15 LAB — COMPREHENSIVE METABOLIC PANEL (CC13)
Albumin: 3.3 g/dL — ABNORMAL LOW (ref 3.5–5.0)
CO2: 25 mEq/L (ref 22–29)
Glucose: 112 mg/dl — ABNORMAL HIGH (ref 70–99)
Sodium: 140 mEq/L (ref 136–145)
Total Bilirubin: 0.2 mg/dL (ref 0.20–1.20)
Total Protein: 6.9 g/dL (ref 6.4–8.3)

## 2012-03-15 LAB — CBC WITH DIFFERENTIAL/PLATELET
Basophils Absolute: 0.1 10*3/uL (ref 0.0–0.1)
Eosinophils Absolute: 0.1 10*3/uL (ref 0.0–0.5)
HCT: 30.9 % — ABNORMAL LOW (ref 34.8–46.6)
HGB: 10.2 g/dL — ABNORMAL LOW (ref 11.6–15.9)
MCV: 92.1 fL (ref 79.5–101.0)
MONO%: 10.2 % (ref 0.0–14.0)
NEUT#: 3.6 10*3/uL (ref 1.5–6.5)
Platelets: 280 10*3/uL (ref 145–400)
RDW: 17.4 % — ABNORMAL HIGH (ref 11.2–14.5)

## 2012-03-15 NOTE — Telephone Encounter (Signed)
appts made and printed for pt aom °

## 2012-03-15 NOTE — Patient Instructions (Signed)
Take steroid (decadron, dexamethasone) five of the 4 mg tablets with food at 1:00 AM on Wed 9-4, which is 12 hours before chemo at 1 PM on 9-4.  Take steroid (five tablets) with food at 10 PM on 03-22-10, which is ~12 hours before chemo at 10:15 AM on 03-23-12   Miralax 1/2 - 1 capful daily AS NEEDED to keep bowels moving daily

## 2012-03-15 NOTE — Progress Notes (Signed)
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 Situation was complicated by erosion of tumor into colon at presentation and with multifactorial acute renal failure prior to starting chemotherapy. She also has diabetes, tho presently has needed no coverage other than SS insulin around steroids with chemotherapy. She had to reschedule her new patient visit to PCP until 03-21-12.  Patient had presented with GI symptoms thought IBD in April 2013. She had colonoscopy by Dr Stan Head 01-26-12, with large ulcer in ascending colon, this difficult to visualize and to biopsy. Pathology [id number] found adenocarcinoma with immunohistochemistry consistent with noncolonic primary. CT AP (with contrast) in [facility] 02-02-12 had complex solid and cystic left adnexal mass, right ovarian mass, omental caking and peritoneal carcinomatosis. She was seen by Dr Nelly Rout on 02-09-12, with CA 125 22,938 and CEA 0.5; CMET resulted after Dr Forrestine Him visit had BUN 63 and creatinine 8.78. She was admitted to [facility] 7-31 thru 02-21-12 with acute renal failure felt multifactorial including prerenal with N/V/diarrhea/ascites, NSAID and ACE inhibitors, lasix, IV contrast, HTN and DM. Renal function progressively improved; she does not have follow up planned with nephrology outpatient. She had paracentesis of 5 liters on 02-11-12 and 2.4 liters on 02-18-12. Repeat CT without contrast 02-11-12 had no change in adnexal masses, peritoneal or omental disease, no bowel obstruction, some bilateral pleural fluid.   Patient cancelled appointment with me 03-09-12 due to transportation issues. She reports that she has felt much better overall since she was here last, until multiple loose stools  yesterday which seem related to miralax. She had no nausea/ vomiting and did push po fluids. She had one loose stool this am with none since, did not take miralax yesterday. Blood sugar was 108 this am without insulin. She and family are aware that blood sugars will transiently increase with steroids used prior to taxol chemotherapy. Appetite has improved and she is eating 3 meals + one Ensure and snacks in 24 hours; she drinks mostly water and diet soda, which we have discussed. She is voiding without difficulty. She cannot tell any abdominal distension or LE swelling now and denies peripheral neuropathy. She denies shortness of breath, pain, fever or symptoms of infection, bleeding. Energy has improved to the point that she is up easily at home and has done laundry. Remainder of 10 point Review of Systems negative.  Objective:  Vital signs in last 24 hours:  BP 148/83  Pulse 93  Temp 98.6 F (37 C) (Oral)  Resp 18  Ht 5\' 3"  (1.6 m)  Wt 143 lb 12.8 oz (65.227 kg)  BMI 25.47 kg/m2 This weight is down from 163 on Aug 19; usual weight prior to illness reportedly ~ 165. Able to get on and off exam table with minimal assistance, looks comfortable, alert and pleasant. Uses WC for length of office hall.  HEENT:PERRLA, sclera clear, anicteric and oropharynx clear, no lesions. Mucous membranes moist LymphaticsCervical, supraclavicular, and axillary nodes normal. No inguinal adenopathy Resp: clear to auscultation bilaterally and normal percussion bilaterally Cardio: regular rate and rhythm GI: soft, nontender, normal bowel sounds, not distended, no clear HSM or mass Extremities: extremities normal, atraumatic, no cyanosis or edema Neuro:no sensory deficits noted Skin without rash or petechiae  Lab Results Results for orders placed in visit on 03/15/12  CBC WITH DIFFERENTIAL      Component Value Range   WBC 4.9  3.9 - 10.3 10e3/uL   NEUT# 3.6  1.5 - 6.5 10e3/uL   HGB 10.2 (*) 11.6 - 15.9 g/dL    HCT 16.1 (*) 09.6 - 46.6 %   Platelets 280  145 - 400 10e3/uL   MCV 92.1  79.5 - 101.0 fL   MCH 30.5  25.1 - 34.0 pg   MCHC 33.1  31.5 - 36.0 g/dL   RBC 0.45 (*) 4.09 - 8.11 10e6/uL   RDW 17.4 (*) 11.2 - 14.5 %   lymph# 0.6 (*) 0.9 - 3.3 10e3/uL   MONO# 0.5  0.1 - 0.9 10e3/uL   Eosinophils Absolute 0.1  0.0 - 0.5 10e3/uL   Basophils Absolute 0.1  0.0 - 0.1 10e3/uL   NEUT% 73.5  38.4 - 76.8 %   LYMPH% 13.1 (*) 14.0 - 49.7 %   MONO% 10.2  0.0 - 14.0 %   EOS% 2.2  0.0 - 7.0 %   BASO% 1.0  0.0 - 2.0 %  COMPREHENSIVE METABOLIC PANEL (CC13)      Component Value Range   Sodium 140  136 - 145 mEq/L   Potassium 4.2  3.5 - 5.1 mEq/L   Chloride 105  98 - 107 mEq/L   CO2 25  22 - 29 mEq/L   Glucose 112 (*) 70 - 99 mg/dl   BUN 91.4  7.0 - 78.2 mg/dL   Creatinine 0.8  0.6 - 1.1 mg/dL   Total Bilirubin 9.56  0.20 - 1.20 mg/dL   Alkaline Phosphatase 65  40 - 150 U/L   AST 17  5 - 34 U/L   ALT 17  0 - 55 U/L   Total Protein 6.9  6.4 - 8.3 g/dL   Albumin 3.3 (*) 3.5 - 5.0 g/dL  CORRECTED CALCIUM (OZ30)      Component Value Range   Calcium, Corrected 10.6 (*) 8.4 - 10.4 mg/dL    Chemistries available while patient was still at office notable for good renal function and electrolytes despite amount of weight loss and diarrhea.  CA 125 sent and pending Studies/Results:  No results found. We discussed plan to repeat scans after ~ 3 cycles of chemotherapy, which should be prior to next visit to gyn oncology. Medications: I have reviewed the patient's current medications and given written and oral instructions for miralax and for decadron 20 mg 12 hrs prior to taxol.  I have told patient and family that clinically she is clearly improving with just the 2 doses of taxol that she has had thus far. We will use same taxol for cycle 2 (day 1 on 9-4 and day 8 on 9-11), then will try to add taxol with day 1 cycle 3.   Assessment/Plan: 1.Gyn adenocarcinoma presenting with adnexal masses, omental  caking, malignant ascites and invasion into bowel, now receiving neoadjuvant taxol, this chosen due to complications around the time of diagnosis. She has had no gCSF thus far. Next gyn oncology appointment is scheduled 05-19-12.  2.diabetes: off oral agents due to renal problems, with blood sugars in good range presently. Will cover with SS insulin around steroids required for taxol. 3.acute multifactorial renal failure just after diagnosis of the gyn cancer: renal function remains good and we will try adding carboplatin with cycle 3 if stable.  4.history of HTN: off lotrel since hypotension at last visit, with BP some higher today. WIll follow.  Patient and family were comfortable  with plan as above.   LIVESAY,LENNIS P, MD   03/15/2012, 2:01 PM

## 2012-03-16 ENCOUNTER — Other Ambulatory Visit (HOSPITAL_BASED_OUTPATIENT_CLINIC_OR_DEPARTMENT_OTHER): Payer: Medicare Other | Admitting: Lab

## 2012-03-16 ENCOUNTER — Ambulatory Visit (HOSPITAL_BASED_OUTPATIENT_CLINIC_OR_DEPARTMENT_OTHER): Payer: Medicaid Other

## 2012-03-16 ENCOUNTER — Ambulatory Visit: Payer: Medicare Other | Admitting: Lab

## 2012-03-16 ENCOUNTER — Other Ambulatory Visit: Payer: Self-pay | Admitting: Oncology

## 2012-03-16 ENCOUNTER — Other Ambulatory Visit: Payer: Self-pay

## 2012-03-16 VITALS — BP 129/71 | HR 86 | Temp 97.5°F | Resp 16

## 2012-03-16 DIAGNOSIS — C569 Malignant neoplasm of unspecified ovary: Secondary | ICD-10-CM

## 2012-03-16 DIAGNOSIS — E119 Type 2 diabetes mellitus without complications: Secondary | ICD-10-CM

## 2012-03-16 DIAGNOSIS — Z5111 Encounter for antineoplastic chemotherapy: Secondary | ICD-10-CM

## 2012-03-16 LAB — WHOLE BLOOD GLUCOSE
Glucose: 356 mg/dL — ABNORMAL HIGH (ref 70–100)
HRS PC: 0.5 Hours

## 2012-03-16 MED ORDER — INSULIN REGULAR HUMAN 100 UNIT/ML IJ SOLN
4.0000 [IU] | Freq: Once | INTRAMUSCULAR | Status: AC
  Administered 2012-03-16: 4 [IU] via SUBCUTANEOUS
  Filled 2012-03-16: qty 0.04

## 2012-03-16 MED ORDER — PACLITAXEL CHEMO INJECTION 300 MG/50ML
80.0000 mg/m2 | Freq: Once | INTRAVENOUS | Status: AC
  Administered 2012-03-16: 138 mg via INTRAVENOUS
  Filled 2012-03-16: qty 23

## 2012-03-16 MED ORDER — DEXAMETHASONE SODIUM PHOSPHATE 4 MG/ML IJ SOLN
20.0000 mg | Freq: Once | INTRAMUSCULAR | Status: AC
  Administered 2012-03-16: 20 mg via INTRAVENOUS

## 2012-03-16 MED ORDER — INSULIN REGULAR HUMAN 100 UNIT/ML IJ SOLN
2.0000 [IU] | Freq: Once | INTRAMUSCULAR | Status: AC
  Administered 2012-03-16: 8 [IU] via SUBCUTANEOUS
  Filled 2012-03-16: qty 0.02

## 2012-03-16 MED ORDER — DIPHENHYDRAMINE HCL 50 MG/ML IJ SOLN
50.0000 mg | Freq: Once | INTRAMUSCULAR | Status: AC
  Administered 2012-03-16: 50 mg via INTRAVENOUS

## 2012-03-16 MED ORDER — SODIUM CHLORIDE 0.9 % IV SOLN
Freq: Once | INTRAVENOUS | Status: AC
  Administered 2012-03-16: 14:00:00 via INTRAVENOUS

## 2012-03-16 MED ORDER — FAMOTIDINE IN NACL 20-0.9 MG/50ML-% IV SOLN
20.0000 mg | Freq: Once | INTRAVENOUS | Status: AC
  Administered 2012-03-16: 20 mg via INTRAVENOUS

## 2012-03-16 NOTE — Patient Instructions (Addendum)
[facility] Discharge Instructions for Patients Receiving Chemotherapy  Today you received the following chemotherapy agents taxol  To help prevent nausea and vomiting after your treatment, we encourage you to take your nausea medication if needed Begin taking it at 7pm and take it as often as prescribed for the next 48 hours if needed.   If you develop nausea and vomiting that is not controlled by your nausea medication, call the clinic. If it is after clinic hours your family physician or the after hours number for the clinic or go to the Emergency Department.   BELOW ARE SYMPTOMS THAT SHOULD BE REPORTED IMMEDIATELY:  *FEVER GREATER THAN 100.5 F  *CHILLS WITH OR WITHOUT FEVER  NAUSEA AND VOMITING THAT IS NOT CONTROLLED WITH YOUR NAUSEA MEDICATION  *UNUSUAL SHORTNESS OF BREATH  *UNUSUAL BRUISING OR BLEEDING  TENDERNESS IN MOUTH AND THROAT WITH OR WITHOUT PRESENCE OF ULCERS  *URINARY PROBLEMS  *BOWEL PROBLEMS  UNUSUAL RASH Items with * indicate a potential emergency and should be followed up as soon as possible.  One of the nurses will contact you 24 hours after your treatment. Please let the nurse know about any problems that you may have experienced. Feel free to call the clinic you have any questions or concerns. The clinic phone number is [phone].   I have been informed and understand all the instructions given to me. I know to contact the clinic, my physician, or go to the Emergency Department if any problems should occur. I do not have any questions at this time, but understand that I may call the clinic during office hours   should I have any questions or need assistance in obtaining follow up care.    __________________________________________  _____________  __________ Signature of Patient or Authorized Representative            Date                   Time    __________________________________________ Nurse's Signature

## 2012-03-23 ENCOUNTER — Ambulatory Visit (HOSPITAL_BASED_OUTPATIENT_CLINIC_OR_DEPARTMENT_OTHER): Payer: Medicare Other

## 2012-03-23 ENCOUNTER — Other Ambulatory Visit: Payer: Self-pay | Admitting: Oncology

## 2012-03-23 ENCOUNTER — Other Ambulatory Visit (HOSPITAL_BASED_OUTPATIENT_CLINIC_OR_DEPARTMENT_OTHER): Payer: Medicare Other | Admitting: Lab

## 2012-03-23 ENCOUNTER — Ambulatory Visit: Payer: Medicare Other | Admitting: Lab

## 2012-03-23 VITALS — BP 157/88 | HR 94 | Temp 97.0°F | Resp 20

## 2012-03-23 DIAGNOSIS — E119 Type 2 diabetes mellitus without complications: Secondary | ICD-10-CM

## 2012-03-23 DIAGNOSIS — C569 Malignant neoplasm of unspecified ovary: Secondary | ICD-10-CM

## 2012-03-23 DIAGNOSIS — Z5111 Encounter for antineoplastic chemotherapy: Secondary | ICD-10-CM

## 2012-03-23 LAB — CBC WITH DIFFERENTIAL/PLATELET
Basophils Absolute: 0 10*3/uL (ref 0.0–0.1)
Eosinophils Absolute: 0 10*3/uL (ref 0.0–0.5)
HCT: 32 % — ABNORMAL LOW (ref 34.8–46.6)
LYMPH%: 8.5 % — ABNORMAL LOW (ref 14.0–49.7)
MCV: 91.4 fL (ref 79.5–101.0)
MONO#: 0 10*3/uL — ABNORMAL LOW (ref 0.1–0.9)
MONO%: 0.4 % (ref 0.0–14.0)
NEUT#: 4.9 10*3/uL (ref 1.5–6.5)
NEUT%: 91.1 % — ABNORMAL HIGH (ref 38.4–76.8)
Platelets: 310 10*3/uL (ref 145–400)
RBC: 3.5 10*6/uL — ABNORMAL LOW (ref 3.70–5.45)
WBC: 5.4 10*3/uL (ref 3.9–10.3)

## 2012-03-23 MED ORDER — DEXAMETHASONE SODIUM PHOSPHATE 4 MG/ML IJ SOLN
20.0000 mg | Freq: Once | INTRAMUSCULAR | Status: AC
  Administered 2012-03-23: 20 mg via INTRAVENOUS

## 2012-03-23 MED ORDER — INSULIN REGULAR HUMAN 100 UNIT/ML IJ SOLN
8.0000 [IU] | Freq: Once | INTRAMUSCULAR | Status: AC
  Administered 2012-03-23: 8 [IU] via SUBCUTANEOUS
  Filled 2012-03-23: qty 0.08

## 2012-03-23 MED ORDER — FAMOTIDINE IN NACL 20-0.9 MG/50ML-% IV SOLN
20.0000 mg | Freq: Once | INTRAVENOUS | Status: AC
  Administered 2012-03-23: 20 mg via INTRAVENOUS

## 2012-03-23 MED ORDER — PACLITAXEL CHEMO INJECTION 300 MG/50ML: 80.0000 mg/m2 | Freq: Once | INTRAVENOUS | Status: DC

## 2012-03-23 MED ORDER — DIPHENHYDRAMINE HCL 50 MG/ML IJ SOLN
50.0000 mg | Freq: Once | INTRAMUSCULAR | Status: AC
  Administered 2012-03-23: 50 mg via INTRAVENOUS

## 2012-03-23 MED ORDER — INSULIN REGULAR HUMAN 100 UNIT/ML IJ SOLN
2.0000 [IU] | Freq: Once | INTRAMUSCULAR | Status: AC
  Administered 2012-03-23: 8 [IU] via SUBCUTANEOUS
  Filled 2012-03-23: qty 0.02

## 2012-03-23 MED ORDER — SODIUM CHLORIDE 0.9 % IV SOLN
80.0000 mg/m2 | Freq: Once | INTRAVENOUS | Status: AC
  Administered 2012-03-23: 138 mg via INTRAVENOUS
  Filled 2012-03-23: qty 23

## 2012-03-23 MED ORDER — SODIUM CHLORIDE 0.9 % IV SOLN
500.0000 mL | Freq: Once | INTRAVENOUS | Status: AC
  Administered 2012-03-23: 500 mL via INTRAVENOUS

## 2012-03-23 MED ORDER — SODIUM CHLORIDE 0.9 % IV SOLN
Freq: Once | INTRAVENOUS | Status: AC
  Administered 2012-03-23: 11:00:00 via INTRAVENOUS

## 2012-03-23 NOTE — Patient Instructions (Signed)
[facility] Discharge Instructions for Patients Receiving Chemotherapy  Today you received the following chemotherapy agents Vp-16/ Carbo To help prevent nausea and vomiting after your treatment, we encourage you to take your nausea medication as directed   If you develop nausea and vomiting that is not controlled by your nausea medication, call the clinic. If it is after clinic hours your family physician or the after hours number for the clinic or go to the Emergency Department.   BELOW ARE SYMPTOMS THAT SHOULD BE REPORTED IMMEDIATELY:  *FEVER GREATER THAN 100.5 F  *CHILLS WITH OR WITHOUT FEVER  NAUSEA AND VOMITING THAT IS NOT CONTROLLED WITH YOUR NAUSEA MEDICATION  *UNUSUAL SHORTNESS OF BREATH  *UNUSUAL BRUISING OR BLEEDING  TENDERNESS IN MOUTH AND THROAT WITH OR WITHOUT PRESENCE OF ULCERS  *URINARY PROBLEMS  *BOWEL PROBLEMS  UNUSUAL RASH Items with * indicate a potential emergency and should be followed up as soon as possible.  One of the nurses will contact you 24 hours after your treatment. Please let the nurse know about any problems that you may have experienced. Feel free to call the clinic you have any questions or concerns. The clinic phone number is [phone].   I have been informed and understand all the instructions given to me. I know to contact the clinic, my physician, or go to the Emergency Department if any problems should occur. I do not have any questions at this time, but understand that I may call the clinic during office hours   should I have any questions or need assistance in obtaining follow up care.    __________________________________________  _____________  __________ Signature of Patient or Authorized Representative            Date                   Time    __________________________________________ Nurse's Signature

## 2012-03-23 NOTE — Progress Notes (Signed)
  Discharge Instructions for Patients Receiving Chemotherapy  Today you received the following chemotherapy agents  VP-16/ Carboplatin To help prevent nausea and vomiting after your treatment, we encourage you to take your nausea medication as directed.  If you develop nausea and vomiting that is not controlled by your nausea medication, call the clinic. If it is after clinic hours your family physician or the after hours number for the clinic or go to the Emergency Department.   BELOW ARE SYMPTOMS THAT SHOULD BE REPORTED IMMEDIATELY:  *FEVER GREATER THAN 101.0 F  *CHILLS WITH OR WITHOUT FEVER  NAUSEA AND VOMITING THAT IS NOT CONTROLLED WITH YOUR NAUSEA MEDICATION  *UNUSUAL SHORTNESS OF BREATH  *UNUSUAL BRUISING OR BLEEDING  TENDERNESS IN MOUTH AND THROAT WITH OR WITHOUT PRESENCE OF ULCERS  *URINARY PROBLEMS  *BOWEL PROBLEMS  UNUSUAL RASH Items with * indicate a potential emergency and should be followed up as soon as possible.  One of the nurses will contact you 24 hours after your treatment. Please let the nurse know about any problems that you may have experienced. Feel free to call the clinic you have any questions or concerns. The clinic phone number is [phone].   I have been informed and understand all the instructions given to me. I know to contact the clinic, my physician, or go to the Emergency Department if any problems should occur. I do not have any questions at this time, but understand that I may call the clinic during office hours or the Patient Navigator at [phone] should I have any questions or need assistance in obtaining follow up care.    __________________________________________  _____________  __________ Signature of Patient or Authorized Representative            Date                   Time    __________________________________________ Nurse's Signature

## 2012-03-25 ENCOUNTER — Other Ambulatory Visit: Payer: Self-pay | Admitting: *Deleted

## 2012-03-25 DIAGNOSIS — C569 Malignant neoplasm of unspecified ovary: Secondary | ICD-10-CM

## 2012-03-25 MED ORDER — LORAZEPAM 0.5 MG PO TABS: ORAL_TABLET | ORAL | Status: DC

## 2012-04-03 ENCOUNTER — Other Ambulatory Visit: Payer: Self-pay | Admitting: Oncology

## 2012-04-04 ENCOUNTER — Telehealth: Payer: Self-pay | Admitting: Oncology

## 2012-04-04 ENCOUNTER — Other Ambulatory Visit: Payer: Self-pay

## 2012-04-04 ENCOUNTER — Ambulatory Visit (HOSPITAL_BASED_OUTPATIENT_CLINIC_OR_DEPARTMENT_OTHER): Payer: Medicare Other | Admitting: Oncology

## 2012-04-04 ENCOUNTER — Other Ambulatory Visit (HOSPITAL_BASED_OUTPATIENT_CLINIC_OR_DEPARTMENT_OTHER): Payer: Medicare Other | Admitting: Lab

## 2012-04-04 ENCOUNTER — Encounter: Payer: Self-pay | Admitting: Oncology

## 2012-04-04 VITALS — BP 118/79 | HR 78 | Temp 97.0°F | Resp 18 | Ht 63.0 in | Wt 147.8 lb

## 2012-04-04 DIAGNOSIS — C569 Malignant neoplasm of unspecified ovary: Secondary | ICD-10-CM

## 2012-04-04 DIAGNOSIS — N289 Disorder of kidney and ureter, unspecified: Secondary | ICD-10-CM

## 2012-04-04 DIAGNOSIS — E119 Type 2 diabetes mellitus without complications: Secondary | ICD-10-CM

## 2012-04-04 LAB — CBC WITH DIFFERENTIAL/PLATELET
BASO%: 1 % (ref 0.0–2.0)
LYMPH%: 23.3 % (ref 14.0–49.7)
MCHC: 33.4 g/dL (ref 31.5–36.0)
MONO#: 0.4 10*3/uL (ref 0.1–0.9)
NEUT#: 1.6 10*3/uL (ref 1.5–6.5)
RBC: 3.41 10*6/uL — ABNORMAL LOW (ref 3.70–5.45)
RDW: 19.6 % — ABNORMAL HIGH (ref 11.2–14.5)
WBC: 2.7 10*3/uL — ABNORMAL LOW (ref 3.9–10.3)
lymph#: 0.6 10*3/uL — ABNORMAL LOW (ref 0.9–3.3)

## 2012-04-04 LAB — COMPREHENSIVE METABOLIC PANEL (CC13)
ALT: 27 U/L (ref 0–55)
Albumin: 3.3 g/dL — ABNORMAL LOW (ref 3.5–5.0)
CO2: 26 mEq/L (ref 22–29)
Chloride: 103 mEq/L (ref 98–107)
Potassium: 4.7 mEq/L (ref 3.5–5.1)
Sodium: 137 mEq/L (ref 136–145)
Total Bilirubin: 0.3 mg/dL (ref 0.20–1.20)
Total Protein: 6 g/dL — ABNORMAL LOW (ref 6.4–8.3)

## 2012-04-04 MED ORDER — DEXAMETHASONE 4 MG PO TABS: ORAL_TABLET | ORAL | Status: DC

## 2012-04-04 MED ORDER — HYDROCODONE-ACETAMINOPHEN 5-325 MG PO TABS: 1.0000 | ORAL_TABLET | Freq: Four times a day (QID) | ORAL | Status: DC | PRN

## 2012-04-04 NOTE — Progress Notes (Signed)
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 We will try adding carboplatin for upcoming cycle, as previous multifactorial acute renal failure has resolved. She has now established with Dr Jacqulyn Bath as PCP, amoxicillin for right otitis begun thru that office 04-01-12.  Patient had presented with GI symptoms thought IBD in April 2013. She had colonoscopy by Dr Stan Head 01-26-12, with large ulcer in ascending colon, this difficult to visualize and to biopsy. Pathology [phone] found adenocarcinoma with immunohistochemistry consistent with noncolonic primary. CT AP (with contrast) in [facility] 02-02-12 had complex solid and cystic left adnexal mass, right ovarian mass, omental caking and peritoneal carcinomatosis. She was seen by Dr Nelly Rout on 02-09-12, with CA 125 22,938 and CEA 0.5; CMET resulted after Dr Forrestine Him visit had BUN 63 and creatinine 8.78. She was admitted to [facility] 7-31 thru 02-21-12 with acute renal failure felt multifactorial including prerenal with N/V/diarrhea/ascites, NSAID and ACE inhibitors, lasix, IV contrast, HTN and DM. Renal function progressively improved; she does not have follow up planned with nephrology outpatient. She had paracentesis of 5 liters on 02-11-12 and 2.4 liters on 02-18-12. Repeat CT without contrast 02-11-12 had no change in adnexal masses, peritoneal or omental disease, no bowel obstruction, some bilateral pleural fluid. She has tolerated the weekly taxol well other than elevated blood sugars from premedication steroids + diabetes; she has not had gCSF yet. Last imaging was CT AP 02-11-12. CA 125, which was 22,938 at presentation is down to 1835 by labs  resulting after visit today. Plan is for possible debulking surgery after "3-4 cycles of chemo including carboplatin" per gyn oncology.  Patient has felt generally well since the right otitis improved quickly on amoxicillin, which she has almost completed. She has had no fever or other symptoms of infection. She has occasional abdominal discomfort for which she uses occasional hydrocodone. Bowels are moving and she is voiding without difficulty. She has had no bleeding. She has not had recent nausea and is eating regular diet. She denies peripheral neuropathy symptoms. Blood sugars are back to usual reasonable range. As she has been checking and covering blood sugars only twice daily for the 1-2 days after chemotherapy when they are much higher, I have asked her to do this at least tid after chemo until back to baseline. Remainder of 10 point Review of Systems negative.  Objective:  Vital signs in last 24 hours:  BP 118/79  Pulse 78  Temp 97 F (36.1 C) (Oral)  Resp 18  Ht 5\' 3"  (1.6 m)  Wt 147 lb 12.8 oz (67.042 kg)  BMI 26.18 kg/m2 Weight is up 4 lbs. Easily ambulatory, looks comfortable, hair thinning but not complete alopecia. Respirations not labored RA.   HEENT:PERRLA, sclera clear, anicteric, oropharynx clear, no lesions and neck supple with midline trachea. Minimal erythema distal right auditory canal without TM bulging, left unremarkable. LymphaticsCervical, supraclavicular, and axillary nodes normal. No inguinal adenopathy. Resp: clear to auscultation bilaterally and normal percussion bilaterally Cardio: regular rate and rhythm GI: soft, nontender, normal bowel sounds, no clear HSM or mass Extremities: extremities normal, atraumatic, no cyanosis or edema. Site of most recent IV not remarkable Neuro:unchanged No central line   Lab Results:  Results for orders placed in  visit on 04/04/12  CBC WITH DIFFERENTIAL      Component Value Range   WBC 2.7 (*) 3.9 - 10.3 10e3/uL    NEUT# 1.6  1.5 - 6.5 10e3/uL   HGB 10.6 (*) 11.6 - 15.9 g/dL   HCT 16.1 (*) 09.6 - 04.5 %   Platelets 279  145 - 400 10e3/uL   MCV 93.0  79.5 - 101.0 fL   MCH 31.1  25.1 - 34.0 pg   MCHC 33.4  31.5 - 36.0 g/dL   RBC 4.09 (*) 8.11 - 9.14 10e6/uL   RDW 19.6 (*) 11.2 - 14.5 %   lymph# 0.6 (*) 0.9 - 3.3 10e3/uL   MONO# 0.4  0.1 - 0.9 10e3/uL   Eosinophils Absolute 0.1  0.0 - 0.5 10e3/uL   Basophils Absolute 0.0  0.0 - 0.1 10e3/uL   NEUT% 58.9  38.4 - 76.8 %   LYMPH% 23.3  14.0 - 49.7 %   MONO% 13.2  0.0 - 14.0 %   EOS% 3.6  0.0 - 7.0 %   BASO% 1.0  0.0 - 2.0 %  COMPREHENSIVE METABOLIC PANEL (CC13)      Component Value Range   Sodium 137  136 - 145 mEq/L   Potassium 4.7  3.5 - 5.1 mEq/L   Chloride 103  98 - 107 mEq/L   CO2 26  22 - 29 mEq/L   Glucose 140 (*) 70 - 99 mg/dl   BUN 78.2  7.0 - 95.6 mg/dL   Creatinine 0.8  0.6 - 1.1 mg/dL   Total Bilirubin 2.13  0.20 - 1.20 mg/dL   Alkaline Phosphatase 66  40 - 150 U/L   AST 16  5 - 34 U/L   ALT 27  0 - 55 U/L   Total Protein 6.0 (*) 6.4 - 8.3 g/dL   Albumin 3.3 (*) 3.5 - 5.0 g/dL   Calcium 9.8  8.4 - 08.6 mg/dL   CA 578 available after visit improved at 1835 (this 46962 at presentation and 9835 prior to cycle 2 taxol)  Studies/Results: Note IV contrast was one of multiple renal insults for presenting ARF. No results found.  Medications: I have reviewed the patient's current medications. I have added comment for additional IV NS with chemo, due to addition of carboplatin with history of the renal dysfunction and anticipated elevated blood sugars. Premed decadron 20 mg 12 hrs prior to chemo and hydrocodone refilled today.  Chemistries above resulted while patient was at office, renal function good including creatinine 0.8. Patient and family are in agreement with careful addition of carboplatin to the taxol, as majority of the risk factors for ARF are no longer present.   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 Situation was complicated by erosion of tumor into colon at presentation and  with multifactorial acute renal failure prior to starting chemotherapy. She also has diabetes, tho presently has needed no coverage other than SS insulin around steroids with chemotherapy. She had to reschedule her new patient visit to PCP until 03-21-12.  Patient had presented with GI symptoms thought IBD in April 2013. She had colonoscopy by Dr Stan Head 01-26-12, with large ulcer in ascending colon, this difficult to visualize and to biopsy. Pathology [phone] found adenocarcinoma with immunohistochemistry consistent with noncolonic primary. CT AP (with contrast) in [facility] 02-02-12 had complex solid and cystic left adnexal mass, right ovarian mass, omental caking and peritoneal carcinomatosis. She was seen by Dr Nelly Rout on 02-09-12, with CA 125 22,938 and CEA 0.5; CMET resulted after Dr Forrestine Him visit had BUN 63 and creatinine 8.78. She was admitted to [facility] 7-31 thru 02-21-12 with acute renal failure felt multifactorial including prerenal with N/V/diarrhea/ascites, NSAID and ACE inhibitors, lasix, IV contrast, HTN and DM. Renal function progressively improved; she does not have follow up planned with nephrology outpatient. She had paracentesis of 5 liters on 02-11-12 and 2.4 liters on 02-18-12. Repeat CT without contrast 02-11-12 had no change in adnexal masses, peritoneal or  omental disease, no bowel obstruction, some bilateral pleural fluid.  Patient cancelled appointment with me 03-09-12 due to transportation issues. She reports that she has felt much better overall since she was here last, until multiple loose stools yesterday which seem related to miralax. She had no nausea/ vomiting and did push po fluids. She had one loose stool this am with none since, did not take miralax yesterday. Blood sugar was 108 this am without insulin. She and family are aware that blood sugars will transiently increase with steroids used prior to taxol chemotherapy. Appetite has improved and she is eating 3 meals + one Ensure and snacks in 24 hours; she drinks mostly water and diet soda, which we have discussed. She is voiding without difficulty. She cannot tell any abdominal distension or LE swelling now and denies peripheral neuropathy. She denies shortness of breath, pain, fever or symptoms of infection, bleeding. Energy has improved to the point that she is up easily at home and has done laundry.  Remainder of 10 point Review of Systems negative.  Objective:  Vital signs in last 24 hours:  BP 148/83  Pulse 93  Temp 98.6 F (37 C) (Oral)  Resp 18  Ht 5\' 3"  (1.6 m)  Wt 143 lb 12.8 oz (65.227 kg)  BMI 25.47 kg/m2 This weight is down from 163 on Aug 19; usual weight prior to illness reportedly ~ 165. Able to get on and off exam table with minimal assistance, looks comfortable, alert and pleasant. Uses WC for length of office hall.  HEENT:PERRLA, sclera clear, anicteric and oropharynx clear, no lesions. Mucous membranes moist  LymphaticsCervical, supraclavicular, and axillary nodes normal. No inguinal adenopathy  Resp: clear to auscultation bilaterally and normal percussion bilaterally  Cardio: regular rate and rhythm  GI: soft, nontender, normal bowel sounds, not distended, no clear HSM or mass  Extremities: extremities normal, atraumatic, no cyanosis or edema  Neuro:no sensory  deficits noted  Skin without rash or petechiae  Lab Results     Results for orders placed in visit on 03/15/12     CBC WITH DIFFERENTIAL     Component  Value  Range      WBC  4.9  3.9 - 10.3 10e3/uL      NEUT#  3.6  1.5 - 6.5 10e3/uL  HGB  10.2 (*)  11.6 - 15.9 g/dL      HCT  09.8 (*)  11.9 - 46.6 %      Platelets  280  145 - 400 10e3/uL      MCV  92.1  79.5 - 101.0 fL      MCH  30.5  25.1 - 34.0 pg      MCHC  33.1  31.5 - 36.0 g/dL      RBC  1.47 (*)  8.29 - 5.45 10e6/uL      RDW  17.4 (*)  11.2 - 14.5 %      lymph#  0.6 (*)  0.9 - 3.3 10e3/uL      MONO#  0.5  0.1 - 0.9 10e3/uL      Eosinophils Absolute  0.1  0.0 - 0.5 10e3/uL      Basophils Absolute  0.1  0.0 - 0.1 10e3/uL      NEUT%  73.5  38.4 - 76.8 %      LYMPH%  13.1 (*)  14.0 - 49.7 %      MONO%  10.2  0.0 - 14.0 %      EOS%  2.2  0.0 - 7.0 %      BASO%  1.0  0.0 - 2.0 %     COMPREHENSIVE METABOLIC PANEL (CC13)     Component  Value  Range      Sodium  140  136 - 145 mEq/L      Potassium  4.2  3.5 - 5.1 mEq/L      Chloride  105  98 - 107 mEq/L      CO2  25  22 - 29 mEq/L      Glucose  112 (*)  70 - 99 mg/dl      BUN  56.2  7.0 - 13.0 mg/dL      Creatinine  0.8  0.6 - 1.1 mg/dL      Total Bilirubin  8.65  0.20 - 1.20 mg/dL      Alkaline Phosphatase  65  40 - 150 U/L      AST  17  5 - 34 U/L      ALT  17  0 - 55 U/L      Total Protein  6.9  6.4 - 8.3 g/dL      Albumin  3.3 (*)  3.5 - 5.0 g/dL     CORRECTED CALCIUM (CC13)     Component  Value  Range      Calcium, Corrected  10.6 (*)  8.4 - 10.4 mg/dL     Chemistries available while patient was still at office notable for good renal function and electrolytes despite amount of weight loss and diarrhea.  CA 125 sent and pending  Studies/Results:  No results found.  We discussed plan to repeat scans after ~ 3 cycles of chemotherapy, which should be prior to next visit to gyn oncology.  Medications: I have reviewed the patient's current medications and given written  and oral instructions for miralax and for decadron 20 mg 12 hrs prior to taxol.  I have told patient and family that clinically she is clearly improving with just the 2 doses of taxol that she has had thus far. We will use same taxol for cycle 2 (day 1 on 9-4 and day 8 on 9-11), then will try to add taxol with day 1 cycle 3.  Assessment/Plan:  1.Gyn adenocarcinoma presenting with adnexal masses, omental caking,  malignant ascites and invasion into bowel, now receiving neoadjuvant chemotherapy. She is scheduled back to Dr Nelly Rout 05-19-12 and will need some imaging prior to that visit (note comment re IV contrast above). 2.diabetes: off oral agents due to renal problems, with blood sugars in good range presently. Will cover with SS insulin at least tid around steroids required for taxol. With this and renal problems previously, I have requested additional NS with day 1 cycle 3. 3.acute multifactorial renal failure just after diagnosis of the gyn cancer: renal function remains good and we will try carefully adding carboplatin with day 1 cycle 3 planned 04-07-12. She will have Bmet repeated 04-13-12, to be drawn with IV start in infusion if counts adequate for treatment that day, but will also need to be drawn (by lab) if CBC does not meet parameters for treatment. 4.history of HTN: off lotrel since hypotension previously. Continue to follow; no changes to coverage by new PCP at that visit.  Patient and family were comfortable with plan as above.  Reece Packer, MD  03/15/2012, 2:01 PM                LIVESAY,LENNIS P, MD   04/04/2012, 3:43 PM

## 2012-04-04 NOTE — Patient Instructions (Addendum)
Check blood sugar and cover with sliding scale insulin night of chemo and 3 times the next day. Drink extra unsweetened fluids to stay well hydrated while blood sugar is high

## 2012-04-04 NOTE — Telephone Encounter (Signed)
gv pt appt schedule for September thru November.  °

## 2012-04-05 LAB — CA 125: CA 125: 1835.6 U/mL — ABNORMAL HIGH (ref 0.0–30.2)

## 2012-04-07 ENCOUNTER — Ambulatory Visit (HOSPITAL_BASED_OUTPATIENT_CLINIC_OR_DEPARTMENT_OTHER): Payer: Medicare Other

## 2012-04-07 ENCOUNTER — Telehealth: Payer: Self-pay | Admitting: *Deleted

## 2012-04-07 VITALS — BP 139/73 | HR 85 | Temp 97.6°F

## 2012-04-07 DIAGNOSIS — E119 Type 2 diabetes mellitus without complications: Secondary | ICD-10-CM

## 2012-04-07 DIAGNOSIS — Z5111 Encounter for antineoplastic chemotherapy: Secondary | ICD-10-CM

## 2012-04-07 DIAGNOSIS — C569 Malignant neoplasm of unspecified ovary: Secondary | ICD-10-CM

## 2012-04-07 LAB — WHOLE BLOOD GLUCOSE

## 2012-04-07 MED ORDER — ONDANSETRON 16 MG/50ML IVPB (CHCC)
16.0000 mg | Freq: Once | INTRAVENOUS | Status: AC
  Administered 2012-04-07: 16 mg via INTRAVENOUS

## 2012-04-07 MED ORDER — SODIUM CHLORIDE 0.9 % IV SOLN
Freq: Once | INTRAVENOUS | Status: AC
  Administered 2012-04-07: 13:00:00 via INTRAVENOUS

## 2012-04-07 MED ORDER — PACLITAXEL CHEMO INJECTION 300 MG/50ML
80.0000 mg/m2 | Freq: Once | INTRAVENOUS | Status: AC
  Administered 2012-04-07: 138 mg via INTRAVENOUS
  Filled 2012-04-07: qty 23

## 2012-04-07 MED ORDER — DIPHENHYDRAMINE HCL 50 MG/ML IJ SOLN
50.0000 mg | Freq: Once | INTRAMUSCULAR | Status: AC
  Administered 2012-04-07: 50 mg via INTRAVENOUS

## 2012-04-07 MED ORDER — INSULIN REGULAR HUMAN 100 UNIT/ML IJ SOLN
2.0000 [IU] | INTRAMUSCULAR | Status: DC
  Administered 2012-04-07: 4 [IU] via SUBCUTANEOUS
  Filled 2012-04-07: qty 0.02

## 2012-04-07 MED ORDER — SODIUM CHLORIDE 0.9 % IV SOLN
192.4000 mg | Freq: Once | INTRAVENOUS | Status: AC
  Administered 2012-04-07: 190 mg via INTRAVENOUS
  Filled 2012-04-07: qty 19

## 2012-04-07 MED ORDER — FAMOTIDINE IN NACL 20-0.9 MG/50ML-% IV SOLN
20.0000 mg | Freq: Once | INTRAVENOUS | Status: AC
  Administered 2012-04-07: 20 mg via INTRAVENOUS

## 2012-04-07 MED ORDER — DEXAMETHASONE SODIUM PHOSPHATE 4 MG/ML IJ SOLN
20.0000 mg | Freq: Once | INTRAMUSCULAR | Status: AC
  Administered 2012-04-07: 20 mg via INTRAVENOUS

## 2012-04-07 NOTE — Telephone Encounter (Addendum)
VERBAL ORDER AND READ BACK TO DR.MOHAMED- PT. TO COME FOR CHEMOTHERAPY TREATMENT AS SCHEDULED. NOTIFIED PT.'S DAUGHTER IN LAW, KELLY Danese, OF ABOVE INSTRUCTIONS. SHE VOICES UNDERSTANDING.

## 2012-04-07 NOTE — Progress Notes (Signed)
1715 Patient received 500cc's of normal saline. Refused to stay for more fluids.

## 2012-04-07 NOTE — Patient Instructions (Addendum)
[facility] Discharge Instructions for Patients Receiving Chemotherapy  Today you received the following chemotherapy agents Taxol and Carboplatin.  To help prevent nausea and vomiting after your treatment, we encourage you to take your nausea medication. Begin taking your nausea medication as often as prescribed for by Dr. Darrold Span.    If you develop nausea and vomiting that is not controlled by your nausea medication, call the clinic. If it is after clinic hours your family physician or the after hours number for the clinic or go to the Emergency Department.   BELOW ARE SYMPTOMS THAT SHOULD BE REPORTED IMMEDIATELY:  *FEVER GREATER THAN 100.5 F  *CHILLS WITH OR WITHOUT FEVER  NAUSEA AND VOMITING THAT IS NOT CONTROLLED WITH YOUR NAUSEA MEDICATION  *UNUSUAL SHORTNESS OF BREATH  *UNUSUAL BRUISING OR BLEEDING  TENDERNESS IN MOUTH AND THROAT WITH OR WITHOUT PRESENCE OF ULCERS  *URINARY PROBLEMS  *BOWEL PROBLEMS  UNUSUAL RASH Items with * indicate a potential emergency and should be followed up as soon as possible.  One of the nurses will contact you 24 hours after your treatment. Please let the nurse know about any problems that you may have experienced. Feel free to call the clinic you have any questions or concerns. The clinic phone number is [phone].   I have been informed and understand all the instructions given to me. I know to contact the clinic, my physician, or go to the Emergency Department if any problems should occur. I do not have any questions at this time, but understand that I may call the clinic during office hours   should I have any questions or need assistance in obtaining follow up care.    __________________________________________  _____________  __________ Signature of Patient or Authorized Representative            Date                   Time    __________________________________________ Nurse's Signature   CARBOPLATIN (KAR boe  pla tin) is a chemotherapy drug. It targets fast dividing cells, like cancer cells, and causes these cells to die. This medicine is used to treat ovarian cancer and many other cancers. This medicine may be used for other purposes; ask your health care provider or pharmacist if you have questions. What should I tell my health care provider before I take this medicine? They need to know if you have any of these conditions: -blood disorders -hearing problems -kidney disease -recent or ongoing radiation therapy -an unusual or allergic reaction to carboplatin, cisplatin, other chemotherapy, other medicines, foods, dyes, or preservatives -pregnant or trying to get pregnant -breast-feeding How should I use this medicine? This drug is usually given as an infusion into a vein. It is administered in a hospital or clinic by a specially trained health care professional. Talk to your pediatrician regarding the use of this medicine in children. Special care may be needed. Overdosage: If you think you have taken too much of this medicine contact a poison control center or emergency room at once. NOTE: This medicine is only for you. Do not share this medicine with others. What if I miss a dose? It is important not to miss a dose. Call your doctor or health care professional if you are unable to keep an appointment. What may interact with this medicine? -medicines for seizures -medicines to increase blood counts like filgrastim, pegfilgrastim, sargramostim -some antibiotics like amikacin, gentamicin, neomycin, streptomycin, tobramycin -vaccines Talk to your doctor  or health care professional before taking any of these medicines: -acetaminophen -aspirin -ibuprofen -ketoprofen -naproxen This list may not describe all possible interactions. Give your health care provider a list of all the medicines, herbs, non-prescription drugs, or dietary supplements you use. Also tell them if you smoke, drink alcohol, or  use illegal drugs. Some items may interact with your medicine. What should I watch for while using this medicine? Your condition will be monitored carefully while you are receiving this medicine. You will need important blood work done while you are taking this medicine. This drug may make you feel generally unwell. This is not uncommon, as chemotherapy can affect healthy cells as well as cancer cells. Report any side effects. Continue your course of treatment even though you feel ill unless your doctor tells you to stop. In some cases, you may be given additional medicines to help with side effects. Follow all directions for their use. Call your doctor or health care professional for advice if you get a fever, chills or sore throat, or other symptoms of a cold or flu. Do not treat yourself. This drug decreases your body's ability to fight infections. Try to avoid being around people who are sick. This medicine may increase your risk to bruise or bleed. Call your doctor or health care professional if you notice any unusual bleeding. Be careful brushing and flossing your teeth or using a toothpick because you may get an infection or bleed more easily. If you have any dental work done, tell your dentist you are receiving this medicine. Avoid taking products that contain aspirin, acetaminophen, ibuprofen, naproxen, or ketoprofen unless instructed by your doctor. These medicines may hide a fever. Do not become pregnant while taking this medicine. Women should inform their doctor if they wish to become pregnant or think they might be pregnant. There is a potential for serious side effects to an unborn child. Talk to your health care professional or pharmacist for more information. Do not breast-feed an infant while taking this medicine. What side effects may I notice from receiving this medicine? Side effects that you should report to your doctor or health care professional as soon as possible: -allergic  reactions like skin rash, itching or hives, swelling of the face, lips, or tongue -signs of infection - fever or chills, cough, sore throat, pain or difficulty passing urine -signs of decreased platelets or bleeding - bruising, pinpoint red spots on the skin, black, tarry stools, nosebleeds -signs of decreased red blood cells - unusually weak or tired, fainting spells, lightheadedness -breathing problems -changes in hearing -changes in vision -chest pain -high blood pressure -low blood counts - This drug may decrease the number of white blood cells, red blood cells and platelets. You may be at increased risk for infections and bleeding. -nausea and vomiting -pain, swelling, redness or irritation at the injection site -pain, tingling, numbness in the hands or feet -problems with balance, talking, walking -trouble passing urine or change in the amount of urine Side effects that usually do not require medical attention (report to your doctor or health care professional if they continue or are bothersome): -hair loss -loss of appetite -metallic taste in the mouth or changes in taste This list may not describe all possible side effects. Call your doctor for medical advice about side effects. You may report side effects to FDA at 1-800-FDA-1088. Where should I keep my medicine? This drug is given in a hospital or clinic and will not be stored at home. NOTE:  This sheet is a summary. It may not cover all possible information. If you have questions about this medicine, talk to your doctor, pharmacist, or health care provider.  2012, [employer]. (10/04/2007 2:38:05 PM)   PACLITAXEL (PAK li TAX el) is a chemotherapy drug. It targets fast dividing cells, like cancer cells, and causes these cells to die. This medicine is used to treat ovarian cancer, breast cancer, and other cancers. This medicine may be used for other purposes; ask your health care provider or pharmacist if you have  questions. What should I tell my health care provider before I take this medicine? They need to know if you have any of these conditions: -blood disorders -irregular heartbeat -infection (especially a virus infection such as chickenpox, cold sores, or herpes) -liver disease -previous or ongoing radiation therapy -an unusual or allergic reaction to paclitaxel, alcohol, polyoxyethylated castor oil, other chemotherapy agents, other medicines, foods, dyes, or preservatives -pregnant or trying to get pregnant -breast-feeding How should I use this medicine? This drug is given as an infusion into a vein. It is administered in a hospital or clinic by a specially trained health care professional. Talk to your pediatrician regarding the use of this medicine in children. Special care may be needed. Overdosage: If you think you have taken too much of this medicine contact a poison control center or emergency room at once. NOTE: This medicine is only for you. Do not share this medicine with others. What if I miss a dose? It is important not to miss your dose. Call your doctor or health care professional if you are unable to keep an appointment. What may interact with this medicine? Do not take this medicine with any of the following medications: -disulfiram -metronidazole This medicine may also interact with the following medications: -cyclosporine -dexamethasone -diazepam -ketoconazole -medicines to increase blood counts like filgrastim, pegfilgrastim, sargramostim -other chemotherapy drugs like cisplatin, doxorubicin, epirubicin, etoposide, teniposide, vincristine -quinidine -testosterone -vaccines -verapamil Talk to your doctor or health care professional before taking any of these medicines: -acetaminophen -aspirin -ibuprofen -ketoprofen -naproxen This list may not describe all possible interactions. Give your health care provider a list of all the medicines, herbs, non-prescription  drugs, or dietary supplements you use. Also tell them if you smoke, drink alcohol, or use illegal drugs. Some items may interact with your medicine. What should I watch for while using this medicine? Your condition will be monitored carefully while you are receiving this medicine. You will need important blood work done while you are taking this medicine. This drug may make you feel generally unwell. This is not uncommon, as chemotherapy can affect healthy cells as well as cancer cells. Report any side effects. Continue your course of treatment even though you feel ill unless your doctor tells you to stop. In some cases, you may be given additional medicines to help with side effects. Follow all directions for their use. Call your doctor or health care professional for advice if you get a fever, chills or sore throat, or other symptoms of a cold or flu. Do not treat yourself. This drug decreases your body's ability to fight infections. Try to avoid being around people who are sick. This medicine may increase your risk to bruise or bleed. Call your doctor or health care professional if you notice any unusual bleeding. Be careful brushing and flossing your teeth or using a toothpick because you may get an infection or bleed more easily. If you have any dental work done, tell your  dentist you are receiving this medicine. Avoid taking products that contain aspirin, acetaminophen, ibuprofen, naproxen, or ketoprofen unless instructed by your doctor. These medicines may hide a fever. Do not become pregnant while taking this medicine. Women should inform their doctor if they wish to become pregnant or think they might be pregnant. There is a potential for serious side effects to an unborn child. Talk to your health care professional or pharmacist for more information. Do not breast-feed an infant while taking this medicine. Men are advised not to father a child while receiving this medicine. What side effects may  I notice from receiving this medicine? Side effects that you should report to your doctor or health care professional as soon as possible: -allergic reactions like skin rash, itching or hives, swelling of the face, lips, or tongue -low blood counts - This drug may decrease the number of white blood cells, red blood cells and platelets. You may be at increased risk for infections and bleeding. -signs of infection - fever or chills, cough, sore throat, pain or difficulty passing urine -signs of decreased platelets or bleeding - bruising, pinpoint red spots on the skin, black, tarry stools, nosebleeds -signs of decreased red blood cells - unusually weak or tired, fainting spells, lightheadedness -breathing problems -chest pain -high or low blood pressure -mouth sores -nausea and vomiting -pain, swelling, redness or irritation at the injection site -pain, tingling, numbness in the hands or feet -slow or irregular heartbeat -swelling of the ankle, feet, hands Side effects that usually do not require medical attention (report to your doctor or health care professional if they continue or are bothersome): -bone pain -complete hair loss including hair on your head, underarms, pubic hair, eyebrows, and eyelashes -changes in the color of fingernails -diarrhea -loosening of the fingernails -loss of appetite -muscle or joint pain -red flush to skin -sweating This list may not describe all possible side effects. Call your doctor for medical advice about side effects. You may report side effects to FDA at 1-800-FDA-1088. Where should I keep my medicine? This drug is given in a hospital or clinic and will not be stored at home. NOTE: This sheet is a summary. It may not cover all possible information. If you have questions about this medicine, talk to your doctor, pharmacist, or health care provider.  2012, [employer]. (06/11/2008 11:54:26 AM)

## 2012-04-07 NOTE — Telephone Encounter (Signed)
PT. WAS PRESCRIBED DECADRON 4MG  WITH THE DIRECTIONS TO TAKE 5 TABS 12 HOURS PRIOR TO TAXOL. SHE IS SCHEDULED FOR CHEMO AT 12:30PM TODAY.

## 2012-04-08 ENCOUNTER — Telehealth: Payer: Self-pay | Admitting: *Deleted

## 2012-04-08 NOTE — Telephone Encounter (Signed)
Attempted to complete chemo follow up call for addition of Carbo to weekly Taxol treatment. Left message on machine to call us back if she is having any problems or has any questions.

## 2012-04-10 ENCOUNTER — Other Ambulatory Visit: Payer: Self-pay | Admitting: Oncology

## 2012-04-12 ENCOUNTER — Other Ambulatory Visit: Payer: Self-pay | Admitting: Oncology

## 2012-04-13 ENCOUNTER — Ambulatory Visit (HOSPITAL_BASED_OUTPATIENT_CLINIC_OR_DEPARTMENT_OTHER): Payer: Medicare Other

## 2012-04-13 ENCOUNTER — Other Ambulatory Visit (HOSPITAL_BASED_OUTPATIENT_CLINIC_OR_DEPARTMENT_OTHER): Payer: Medicare Other | Admitting: Lab

## 2012-04-13 VITALS — BP 156/78 | HR 98 | Temp 97.7°F | Resp 17

## 2012-04-13 DIAGNOSIS — C569 Malignant neoplasm of unspecified ovary: Secondary | ICD-10-CM

## 2012-04-13 DIAGNOSIS — Z5111 Encounter for antineoplastic chemotherapy: Secondary | ICD-10-CM

## 2012-04-13 DIAGNOSIS — E119 Type 2 diabetes mellitus without complications: Secondary | ICD-10-CM

## 2012-04-13 LAB — CBC WITH DIFFERENTIAL/PLATELET
BASO%: 0 % (ref 0.0–2.0)
Basophils Absolute: 0 10*3/uL (ref 0.0–0.1)
HCT: 34.1 % — ABNORMAL LOW (ref 34.8–46.6)
HGB: 11.2 g/dL — ABNORMAL LOW (ref 11.6–15.9)
MONO#: 0 10*3/uL — ABNORMAL LOW (ref 0.1–0.9)
NEUT#: 4.2 10*3/uL (ref 1.5–6.5)
NEUT%: 92.1 % — ABNORMAL HIGH (ref 38.4–76.8)
WBC: 4.5 10*3/uL (ref 3.9–10.3)
lymph#: 0.3 10*3/uL — ABNORMAL LOW (ref 0.9–3.3)

## 2012-04-13 LAB — BASIC METABOLIC PANEL (CC13)
CO2: 18 mEq/L — ABNORMAL LOW (ref 22–29)
Glucose: 394 mg/dl — ABNORMAL HIGH (ref 70–99)
Potassium: 4.5 mEq/L (ref 3.5–5.1)
Sodium: 133 mEq/L — ABNORMAL LOW (ref 136–145)

## 2012-04-13 MED ORDER — DEXAMETHASONE SODIUM PHOSPHATE 4 MG/ML IJ SOLN
20.0000 mg | Freq: Once | INTRAMUSCULAR | Status: AC
  Administered 2012-04-13: 20 mg via INTRAVENOUS

## 2012-04-13 MED ORDER — SODIUM CHLORIDE 0.9 % IV SOLN
Freq: Once | INTRAVENOUS | Status: AC
  Administered 2012-04-13: 10:00:00 via INTRAVENOUS

## 2012-04-13 MED ORDER — INSULIN REGULAR HUMAN 100 UNIT/ML IJ SOLN
2.0000 [IU] | INTRAMUSCULAR | Status: DC
  Administered 2012-04-13 (×2): 8 [IU] via SUBCUTANEOUS
  Filled 2012-04-13: qty 0.02

## 2012-04-13 MED ORDER — PACLITAXEL CHEMO INJECTION 300 MG/50ML
80.0000 mg/m2 | Freq: Once | INTRAVENOUS | Status: AC
  Administered 2012-04-13: 138 mg via INTRAVENOUS
  Filled 2012-04-13: qty 23

## 2012-04-13 MED ORDER — DIPHENHYDRAMINE HCL 50 MG/ML IJ SOLN
50.0000 mg | Freq: Once | INTRAMUSCULAR | Status: AC
  Administered 2012-04-13: 50 mg via INTRAVENOUS

## 2012-04-13 MED ORDER — FAMOTIDINE IN NACL 20-0.9 MG/50ML-% IV SOLN
20.0000 mg | Freq: Once | INTRAVENOUS | Status: AC
  Administered 2012-04-13: 20 mg via INTRAVENOUS

## 2012-04-13 MED ORDER — ONDANSETRON 16 MG/50ML IVPB (CHCC)
16.0000 mg | Freq: Once | INTRAVENOUS | Status: AC
  Administered 2012-04-13: 16 mg via INTRAVENOUS

## 2012-04-13 NOTE — Progress Notes (Signed)
1350 Patient received 750cc of Normal Saline today with treatment, as per MD orders to receive 500-1000cc's of normal saline.

## 2012-04-13 NOTE — Patient Instructions (Addendum)
[facility] Discharge Instructions for Patients Receiving Chemotherapy  Today you received the following chemotherapy agent Taxol.  To help prevent nausea and vomiting after your treatment, we encourage you to take your nausea medication. Begin taking your nausea medication as often as prescribed for by Dr. Darrold Span.   If you develop nausea and vomiting that is not controlled by your nausea medication, call the clinic. If it is after clinic hours your family physician or the after hours number for the clinic or go to the Emergency Department.   BELOW ARE SYMPTOMS THAT SHOULD BE REPORTED IMMEDIATELY:  *FEVER GREATER THAN 100.5 F  *CHILLS WITH OR WITHOUT FEVER  NAUSEA AND VOMITING THAT IS NOT CONTROLLED WITH YOUR NAUSEA MEDICATION  *UNUSUAL SHORTNESS OF BREATH  *UNUSUAL BRUISING OR BLEEDING  TENDERNESS IN MOUTH AND THROAT WITH OR WITHOUT PRESENCE OF ULCERS  *URINARY PROBLEMS  *BOWEL PROBLEMS  UNUSUAL RASH Items with * indicate a potential emergency and should be followed up as soon as possible.  One of the nurses will contact you 24 hours after your treatment. Please let the nurse know about any problems that you may have experienced. Feel free to call the clinic you have any questions or concerns. The clinic phone number is [phone].   I have been informed and understand all the instructions given to me. I know to contact the clinic, my physician, or go to the Emergency Department if any problems should occur. I do not have any questions at this time, but understand that I may call the clinic during office hours   should I have any questions or need assistance in obtaining follow up care.    __________________________________________  _____________  __________ Signature of Patient or Authorized Representative            Date                   Time    __________________________________________ Nurse's Signature   PACLITAXEL (PAK li TAX el) is a  chemotherapy drug. It targets fast dividing cells, like cancer cells, and causes these cells to die. This medicine is used to treat ovarian cancer, breast cancer, and other cancers. This medicine may be used for other purposes; ask your health care provider or pharmacist if you have questions. What should I tell my health care provider before I take this medicine? They need to know if you have any of these conditions: -blood disorders -irregular heartbeat -infection (especially a virus infection such as chickenpox, cold sores, or herpes) -liver disease -previous or ongoing radiation therapy -an unusual or allergic reaction to paclitaxel, alcohol, polyoxyethylated castor oil, other chemotherapy agents, other medicines, foods, dyes, or preservatives -pregnant or trying to get pregnant -breast-feeding How should I use this medicine? This drug is given as an infusion into a vein. It is administered in a hospital or clinic by a specially trained health care professional. Talk to your pediatrician regarding the use of this medicine in children. Special care may be needed. Overdosage: If you think you have taken too much of this medicine contact a poison control center or emergency room at once. NOTE: This medicine is only for you. Do not share this medicine with others. What if I miss a dose? It is important not to miss your dose. Call your doctor or health care professional if you are unable to keep an appointment. What may interact with this medicine? Do not take this medicine with any of the following medications: -disulfiram -  metronidazole This medicine may also interact with the following medications: -cyclosporine -dexamethasone -diazepam -ketoconazole -medicines to increase blood counts like filgrastim, pegfilgrastim, sargramostim -other chemotherapy drugs like cisplatin, doxorubicin, epirubicin, etoposide, teniposide, vincristine -quinidine -testosterone -vaccines -verapamil Talk  to your doctor or health care professional before taking any of these medicines: -acetaminophen -aspirin -ibuprofen -ketoprofen -naproxen This list may not describe all possible interactions. Give your health care provider a list of all the medicines, herbs, non-prescription drugs, or dietary supplements you use. Also tell them if you smoke, drink alcohol, or use illegal drugs. Some items may interact with your medicine. What should I watch for while using this medicine? Your condition will be monitored carefully while you are receiving this medicine. You will need important blood work done while you are taking this medicine. This drug may make you feel generally unwell. This is not uncommon, as chemotherapy can affect healthy cells as well as cancer cells. Report any side effects. Continue your course of treatment even though you feel ill unless your doctor tells you to stop. In some cases, you may be given additional medicines to help with side effects. Follow all directions for their use. Call your doctor or health care professional for advice if you get a fever, chills or sore throat, or other symptoms of a cold or flu. Do not treat yourself. This drug decreases your body's ability to fight infections. Try to avoid being around people who are sick. This medicine may increase your risk to bruise or bleed. Call your doctor or health care professional if you notice any unusual bleeding. Be careful brushing and flossing your teeth or using a toothpick because you may get an infection or bleed more easily. If you have any dental work done, tell your dentist you are receiving this medicine. Avoid taking products that contain aspirin, acetaminophen, ibuprofen, naproxen, or ketoprofen unless instructed by your doctor. These medicines may hide a fever. Do not become pregnant while taking this medicine. Women should inform their doctor if they wish to become pregnant or think they might be pregnant. There  is a potential for serious side effects to an unborn child. Talk to your health care professional or pharmacist for more information. Do not breast-feed an infant while taking this medicine. Men are advised not to father a child while receiving this medicine. What side effects may I notice from receiving this medicine? Side effects that you should report to your doctor or health care professional as soon as possible: -allergic reactions like skin rash, itching or hives, swelling of the face, lips, or tongue -low blood counts - This drug may decrease the number of white blood cells, red blood cells and platelets. You may be at increased risk for infections and bleeding. -signs of infection - fever or chills, cough, sore throat, pain or difficulty passing urine -signs of decreased platelets or bleeding - bruising, pinpoint red spots on the skin, black, tarry stools, nosebleeds -signs of decreased red blood cells - unusually weak or tired, fainting spells, lightheadedness -breathing problems -chest pain -high or low blood pressure -mouth sores -nausea and vomiting -pain, swelling, redness or irritation at the injection site -pain, tingling, numbness in the hands or feet -slow or irregular heartbeat -swelling of the ankle, feet, hands Side effects that usually do not require medical attention (report to your doctor or health care professional if they continue or are bothersome): -bone pain -complete hair loss including hair on your head, underarms, pubic hair, eyebrows, and eyelashes -changes in  the color of fingernails -diarrhea -loosening of the fingernails -loss of appetite -muscle or joint pain -red flush to skin -sweating This list may not describe all possible side effects. Call your doctor for medical advice about side effects. You may report side effects to FDA at 1-800-FDA-1088. Where should I keep my medicine? This drug is given in a hospital or clinic and will not be stored at  home. NOTE: This sheet is a summary. It may not cover all possible information. If you have questions about this medicine, talk to your doctor, pharmacist, or health care provider.  2012, [employer]. (06/11/2008 11:54:26 AM)

## 2012-04-18 ENCOUNTER — Other Ambulatory Visit: Payer: Medicare Other | Admitting: Lab

## 2012-04-18 ENCOUNTER — Ambulatory Visit: Payer: Medicare Other | Admitting: Oncology

## 2012-04-21 ENCOUNTER — Encounter: Payer: Self-pay | Admitting: *Deleted

## 2012-04-24 ENCOUNTER — Other Ambulatory Visit: Payer: Self-pay | Admitting: Oncology

## 2012-04-25 ENCOUNTER — Telehealth: Payer: Self-pay | Admitting: Oncology

## 2012-04-25 ENCOUNTER — Other Ambulatory Visit (HOSPITAL_BASED_OUTPATIENT_CLINIC_OR_DEPARTMENT_OTHER): Payer: Medicare Other | Admitting: Lab

## 2012-04-25 ENCOUNTER — Ambulatory Visit (HOSPITAL_BASED_OUTPATIENT_CLINIC_OR_DEPARTMENT_OTHER): Payer: Medicare Other | Admitting: Oncology

## 2012-04-25 ENCOUNTER — Telehealth: Payer: Self-pay | Admitting: *Deleted

## 2012-04-25 ENCOUNTER — Encounter: Payer: Self-pay | Admitting: Oncology

## 2012-04-25 ENCOUNTER — Other Ambulatory Visit: Payer: Self-pay

## 2012-04-25 VITALS — BP 136/78 | HR 100 | Temp 99.4°F | Resp 20 | Ht 63.0 in | Wt 148.8 lb

## 2012-04-25 DIAGNOSIS — E119 Type 2 diabetes mellitus without complications: Secondary | ICD-10-CM

## 2012-04-25 DIAGNOSIS — C569 Malignant neoplasm of unspecified ovary: Secondary | ICD-10-CM

## 2012-04-25 DIAGNOSIS — I10 Essential (primary) hypertension: Secondary | ICD-10-CM

## 2012-04-25 DIAGNOSIS — Z23 Encounter for immunization: Secondary | ICD-10-CM

## 2012-04-25 LAB — CBC WITH DIFFERENTIAL/PLATELET
Basophils Absolute: 0 10*3/uL (ref 0.0–0.1)
Eosinophils Absolute: 0 10*3/uL (ref 0.0–0.5)
HGB: 11.1 g/dL — ABNORMAL LOW (ref 11.6–15.9)
LYMPH%: 19.1 % (ref 14.0–49.7)
MCV: 96.5 fL (ref 79.5–101.0)
MONO%: 11.4 % (ref 0.0–14.0)
NEUT#: 2 10*3/uL (ref 1.5–6.5)
Platelets: 249 10*3/uL (ref 145–400)
RDW: 21.1 % — ABNORMAL HIGH (ref 11.2–14.5)

## 2012-04-25 LAB — COMPREHENSIVE METABOLIC PANEL (CC13)
Albumin: 3.4 g/dL — ABNORMAL LOW (ref 3.5–5.0)
Alkaline Phosphatase: 61 U/L (ref 40–150)
BUN: 14 mg/dL (ref 7.0–26.0)
CO2: 22 mEq/L (ref 22–29)
Glucose: 186 mg/dl — ABNORMAL HIGH (ref 70–99)
Potassium: 4.4 mEq/L (ref 3.5–5.1)

## 2012-04-25 MED ORDER — INFLUENZA VIRUS VACC SPLIT PF IM SUSP
0.5000 mL | Freq: Once | INTRAMUSCULAR | Status: AC
  Administered 2012-04-25: 0.5 mL via INTRAMUSCULAR
  Filled 2012-04-25: qty 0.5

## 2012-04-25 MED ORDER — LORAZEPAM 0.5 MG PO TABS: ORAL_TABLET | ORAL | Status: DC

## 2012-04-25 MED ORDER — HYDROCODONE-ACETAMINOPHEN 5-325 MG PO TABS: 1.0000 | ORAL_TABLET | Freq: Four times a day (QID) | ORAL | Status: DC | PRN

## 2012-04-25 NOTE — Patient Instructions (Signed)
Flu shot given today

## 2012-04-25 NOTE — Telephone Encounter (Signed)
Printed and gv appt schedule to pt for OCT and NOV °

## 2012-04-25 NOTE — Telephone Encounter (Signed)
Per staff message and POF I have scheduled appts.  JMW  

## 2012-04-25 NOTE — Progress Notes (Signed)
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 She tolerated addition of carboplatin to weekly taxol with most recent cycle and will begin cycle 4 treatment on 04-28-12.  Patient had presented with GI symptoms thought IBD in April 2013. She had colonoscopy by Dr Stan Head 01-26-12, with large ulcer in ascending colon, this difficult to visualize and to biopsy. Pathology [phone] found adenocarcinoma with immunohistochemistry consistent with noncolonic primary. CT AP (with contrast) in [facility] 02-02-12 had complex solid and cystic left adnexal mass, right ovarian mass, omental caking and peritoneal carcinomatosis. She was seen by Dr Nelly Rout on 02-09-12, with CA 125 22,938 and CEA 0.5; CMET resulted after Dr Forrestine Him visit had BUN 63 and creatinine 8.78. She was admitted to [facility] 7-31 thru 02-21-12 with acute renal failure felt multifactorial including prerenal with N/V/diarrhea/ascites, NSAID and ACE inhibitors, lasix, IV contrast, HTN and DM. Renal function progressively improved; she does not have follow up planned with nephrology outpatient. She had paracentesis of 5 liters on 02-11-12 and 2.4 liters on 02-18-12. Repeat CT without contrast 02-11-12 had no change in adnexal masses, peritoneal or omental disease, no bowel obstruction, some bilateral pleural fluid. She has tolerated the weekly taxol well other than elevated blood sugars from premedication steroids + diabetes; she has not had gCSF yet. Last imaging was CT AP 02-11-12. CA 125, which was 22,938 at presentation, was down to 1835 prior to cycle 4 (lab done 04-04-12) and is 409 by lab resulting after visit today. She had first 2 cycles of weekly taxol as single agent following the renal problems on presentation, with first cycle started on  02-24-12. She had carboplatin added day 1 cycle 3 (04-07-12) with taxol again day 1 and day 8.  Plan is for possible debulking surgery after neoadjuvant chemotherapy. She will see Dr Nelly Rout next on 05-19-12, and will have repeat CT AP without IV contrast shortly before that visit.  Patient's only complaint today is of aching in lower legs R>L intermittently, not clearly associated with chemotherapy or activity, no swelling, relieved with hydrocodone 5-325 twice daily prn. She is not having the discomfort today. She has otherwise been feeling better overall, without nausea, appetite adequate, and does not notice any abdominal distension or discomfort now. Her activity is increased including sweeping and walking outside. She has no other pain. Bowels are moving adequately and she is voiding easily. She denies respiratory symptoms.  Remainder of 10 point Review of Systems negative.  Objective:  Vital signs in last 24 hours:  BP 136/78  Pulse 100  Temp 99.4 F (37.4 C) (Oral)  Resp 20  Ht 5\' 3"  (1.6 m)  Wt 148 lb 12.8 oz (67.495 kg)  BMI 26.36 kg/m2 Weight is up one lb. Hair thinning without complete alopecia. Using WC in office today, respirations not labored RA.   HEENT:PERRLA, sclera clear, anicteric and oropharynx clear, no lesions LymphaticsCervical, supraclavicular, and axillary nodes normal. Resp: clear to auscultation bilaterally and normal percussion bilaterally Cardio: regular rate and rhythm GI: soft, nontender, not obviously distended, no HSM, some normal bowel sounds Extremities: extremities normal, atraumatic, no cyanosis or edema Neuro:no sensory deficits noted No central catheter Skin without rash or ecchymosis  Lab Results:  Results for orders placed in visit on 04/25/12  CBC WITH DIFFERENTIAL      Component Value  Range   WBC 3.0 (*) 3.9 - 10.3 10e3/uL   NEUT# 2.0  1.5 - 6.5 10e3/uL   HGB 11.1 (*) 11.6 - 15.9 g/dL   HCT 19.1 (*) 47.8 - 29.5 %   Platelets 249  145 -  400 10e3/uL   MCV 96.5  79.5 - 101.0 fL   MCH 32.2  25.1 - 34.0 pg   MCHC 33.3  31.5 - 36.0 g/dL   RBC 6.21 (*) 3.08 - 6.57 10e6/uL   RDW 21.1 (*) 11.2 - 14.5 %   lymph# 0.6 (*) 0.9 - 3.3 10e3/uL   MONO# 0.3  0.1 - 0.9 10e3/uL   Eosinophils Absolute 0.0  0.0 - 0.5 10e3/uL   Basophils Absolute 0.0  0.0 - 0.1 10e3/uL   NEUT% 67.2  38.4 - 76.8 %   LYMPH% 19.1  14.0 - 49.7 %   MONO% 11.4  0.0 - 14.0 %   EOS% 1.4  0.0 - 7.0 %   BASO% 0.9  0.0 - 2.0 %    CMET available after visit with glucose 186, T prot 5.9 and alb 3.4 otherwise normal  CA 125 also available after visit 409.7  Studies/Results: CT AP with contrast 02-02-12 had no liver or other solid organ involvement.  Medications: I have reviewed the patient's current medications. Flu vaccine given today.  Assessment/Plan: 1. 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 She will have cycle 4 day 1 on 10-17 and day 8 on 05-05-12. She will have repeat CT AP without IV contrast prior to Dr Forrestine Him visit. She will have cbc/cmet/ca125 day of Dr Forrestine Him visit and will be set up for cycle 5 chemo to begin 05-26-12 (which is one week delay to allow gyn onc visit) then day 8 on 06-02-12. I will see her at least 06-14-12 prior to cycle 6. 2.Diabetes on insulin: covering elevated glucoses related to chemo steroids with sliding scale. 3. Multifactorial acute renal failure around diagnosis of gyn cancer: resolved but needs caution with potentially nephrotoxic interventions, thus no IV contrast with upcoming CT (see comment under Studies/Results above). 4 hx of HTN 5.Flu vaccine given today.  Patient and family were in agreement with plan above.   Adanna Zuckerman P, MD   04/25/2012, 3:20 PM

## 2012-04-28 ENCOUNTER — Ambulatory Visit (HOSPITAL_BASED_OUTPATIENT_CLINIC_OR_DEPARTMENT_OTHER): Payer: Medicare Other

## 2012-04-28 ENCOUNTER — Telehealth: Payer: Self-pay | Admitting: *Deleted

## 2012-04-28 ENCOUNTER — Other Ambulatory Visit: Payer: Self-pay | Admitting: Oncology

## 2012-04-28 ENCOUNTER — Other Ambulatory Visit (HOSPITAL_BASED_OUTPATIENT_CLINIC_OR_DEPARTMENT_OTHER): Payer: Medicare Other | Admitting: Lab

## 2012-04-28 VITALS — BP 145/76 | HR 94 | Temp 98.8°F

## 2012-04-28 DIAGNOSIS — C569 Malignant neoplasm of unspecified ovary: Secondary | ICD-10-CM

## 2012-04-28 DIAGNOSIS — Z5111 Encounter for antineoplastic chemotherapy: Secondary | ICD-10-CM

## 2012-04-28 DIAGNOSIS — E119 Type 2 diabetes mellitus without complications: Secondary | ICD-10-CM

## 2012-04-28 LAB — CBC WITH DIFFERENTIAL/PLATELET
EOS%: 0 % (ref 0.0–7.0)
Eosinophils Absolute: 0 10*3/uL (ref 0.0–0.5)
LYMPH%: 7.6 % — ABNORMAL LOW (ref 14.0–49.7)
MCH: 31.3 pg (ref 25.1–34.0)
MCV: 94.7 fL (ref 79.5–101.0)
MONO%: 0.4 % (ref 0.0–14.0)
NEUT#: 6.3 10*3/uL (ref 1.5–6.5)
Platelets: 241 10*3/uL (ref 145–400)
RBC: 3.61 10*6/uL — ABNORMAL LOW (ref 3.70–5.45)

## 2012-04-28 LAB — WHOLE BLOOD GLUCOSE: Glucose: 346 mg/dL — ABNORMAL HIGH (ref 70–100)

## 2012-04-28 MED ORDER — SODIUM CHLORIDE 0.9 % IV SOLN
176.6000 mg | Freq: Once | INTRAVENOUS | Status: AC
  Administered 2012-04-28: 180 mg via INTRAVENOUS
  Filled 2012-04-28: qty 18

## 2012-04-28 MED ORDER — DEXAMETHASONE SODIUM PHOSPHATE 4 MG/ML IJ SOLN
20.0000 mg | Freq: Once | INTRAMUSCULAR | Status: AC
  Administered 2012-04-28: 20 mg via INTRAVENOUS

## 2012-04-28 MED ORDER — SODIUM CHLORIDE 0.9 % IV SOLN
Freq: Once | INTRAVENOUS | Status: AC
  Administered 2012-04-28: 14:00:00 via INTRAVENOUS

## 2012-04-28 MED ORDER — INSULIN REGULAR HUMAN 100 UNIT/ML IJ SOLN
2.0000 [IU] | INTRAMUSCULAR | Status: DC
  Administered 2012-04-28: 2 [IU] via SUBCUTANEOUS
  Filled 2012-04-28: qty 0.02

## 2012-04-28 MED ORDER — ONDANSETRON 16 MG/50ML IVPB (CHCC)
16.0000 mg | Freq: Once | INTRAVENOUS | Status: AC
  Administered 2012-04-28: 16 mg via INTRAVENOUS

## 2012-04-28 MED ORDER — DIPHENHYDRAMINE HCL 50 MG/ML IJ SOLN
50.0000 mg | Freq: Once | INTRAMUSCULAR | Status: AC
  Administered 2012-04-28: 50 mg via INTRAVENOUS

## 2012-04-28 MED ORDER — FAMOTIDINE IN NACL 20-0.9 MG/50ML-% IV SOLN
20.0000 mg | Freq: Once | INTRAVENOUS | Status: AC
  Administered 2012-04-28: 20 mg via INTRAVENOUS

## 2012-04-28 MED ORDER — PACLITAXEL CHEMO INJECTION 300 MG/50ML
80.0000 mg/m2 | Freq: Once | INTRAVENOUS | Status: AC
  Administered 2012-04-28: 138 mg via INTRAVENOUS
  Filled 2012-04-28: qty 23

## 2012-04-28 NOTE — Telephone Encounter (Signed)
blood sugar fingerstick today with chemo  Called michelle in the chemo room and she confirmed it has all ready been done

## 2012-04-28 NOTE — Patient Instructions (Signed)
[facility] Discharge Instructions for Patients Receiving Chemotherapy  Today you received the following chemotherapy agents: taxol, carboplatin  To help prevent nausea and vomiting after your treatment, we encourage you to take your nausea medication.  Take it as often as prescribed.     If you develop nausea and vomiting that is not controlled by your nausea medication, call the clinic. If it is after clinic hours your family physician or the after hours number for the clinic or go to the Emergency Department.   BELOW ARE SYMPTOMS THAT SHOULD BE REPORTED IMMEDIATELY:  *FEVER GREATER THAN 100.5 F  *CHILLS WITH OR WITHOUT FEVER  NAUSEA AND VOMITING THAT IS NOT CONTROLLED WITH YOUR NAUSEA MEDICATION  *UNUSUAL SHORTNESS OF BREATH  *UNUSUAL BRUISING OR BLEEDING  TENDERNESS IN MOUTH AND THROAT WITH OR WITHOUT PRESENCE OF ULCERS  *URINARY PROBLEMS  *BOWEL PROBLEMS  UNUSUAL RASH Items with * indicate a potential emergency and should be followed up as soon as possible.  Feel free to call the clinic you have any questions or concerns. The clinic phone number is [phone].   I have been informed and understand all the instructions given to me. I know to contact the clinic, my physician, or go to the Emergency Department if any problems should occur. I do not have any questions at this time, but understand that I may call the clinic during office hours   should I have any questions or need assistance in obtaining follow up care.    __________________________________________  _____________  __________ Signature of Patient or Authorized Representative            Date                   Time    __________________________________________ Nurse's Signature    

## 2012-04-30 ENCOUNTER — Other Ambulatory Visit: Payer: Self-pay | Admitting: Oncology

## 2012-05-01 ENCOUNTER — Other Ambulatory Visit: Payer: Self-pay | Admitting: Oncology

## 2012-05-02 ENCOUNTER — Telehealth: Payer: Self-pay

## 2012-05-02 ENCOUNTER — Telehealth: Payer: Self-pay | Admitting: Oncology

## 2012-05-02 ENCOUNTER — Other Ambulatory Visit: Payer: Self-pay | Admitting: Oncology

## 2012-05-02 NOTE — Telephone Encounter (Signed)
MD tried to reach pt by phone re neuropathy symptoms left hand reported to RN today. No answer and no VM. Will request RN try to reach her 10-23, as I would hold taxol 10-24 if symptoms are significant.  Ila Mcgill, MD

## 2012-05-02 NOTE — Telephone Encounter (Signed)
Ann Baldwin wanted Dr. Darrold Span to know that the fingers in her left hand are numb.  Told her that this is from the chemotherapy.  The more she receives this numbness can occur.   She is able to use the left hand.    She maybe catching a cold.  No symptoms at his time.   Told her that she will be evauated prior to her treatment Thursday 05-05-12.  Tell the nurse if any symptoms of cold.  Call the office prior if temp.  101.5 or greater.

## 2012-05-04 ENCOUNTER — Telehealth: Payer: Self-pay

## 2012-05-04 NOTE — Telephone Encounter (Signed)
Spoke wit Ms. Stimmel about numbness in fingers in left hand.  She states that the fingers are still numb.  The thumb is not.  She is able to button, feels cool and heat.  She is able to hold cups with left hand.   Both feet are achy since treatment.  She is able to walk ok/picking up feet.  No mention of cold symptoms. Will discuss with Dr. Darrold Span and see if treatment will be given with neuropathies as noted.

## 2012-05-04 NOTE — Telephone Encounter (Signed)
Told granddaughter Ann Baldwin that Ann Baldwin seems to be having such a good response of the extensive cancer to this chemotherapy that Dr. Darrold Span would prefer to treat Ann Baldwin as planned 05-05-12.  Sr. Darrold Span would usually not hold this weekly taxol for the symptoms described, tho if neuropathy gets worse from here Dr. Darrold Span can change the taxol to a different medicine.

## 2012-05-05 ENCOUNTER — Ambulatory Visit (HOSPITAL_BASED_OUTPATIENT_CLINIC_OR_DEPARTMENT_OTHER): Payer: Medicare Other

## 2012-05-05 ENCOUNTER — Other Ambulatory Visit (HOSPITAL_BASED_OUTPATIENT_CLINIC_OR_DEPARTMENT_OTHER): Payer: Medicare Other | Admitting: Lab

## 2012-05-05 VITALS — BP 161/84 | HR 108 | Temp 99.1°F | Resp 20

## 2012-05-05 DIAGNOSIS — E119 Type 2 diabetes mellitus without complications: Secondary | ICD-10-CM

## 2012-05-05 DIAGNOSIS — C569 Malignant neoplasm of unspecified ovary: Secondary | ICD-10-CM

## 2012-05-05 DIAGNOSIS — Z5111 Encounter for antineoplastic chemotherapy: Secondary | ICD-10-CM

## 2012-05-05 LAB — CBC WITH DIFFERENTIAL/PLATELET
BASO%: 0.1 % (ref 0.0–2.0)
EOS%: 0 % (ref 0.0–7.0)
HCT: 34.6 % — ABNORMAL LOW (ref 34.8–46.6)
MCH: 32 pg (ref 25.1–34.0)
MCHC: 33.5 g/dL (ref 31.5–36.0)
NEUT%: 94.3 % — ABNORMAL HIGH (ref 38.4–76.8)
RDW: 17.6 % — ABNORMAL HIGH (ref 11.2–14.5)
lymph#: 0.4 10*3/uL — ABNORMAL LOW (ref 0.9–3.3)

## 2012-05-05 LAB — WHOLE BLOOD GLUCOSE: HRS PC: 1 Hours

## 2012-05-05 MED ORDER — SODIUM CHLORIDE 0.9 % IV SOLN
Freq: Once | INTRAVENOUS | Status: AC
  Administered 2012-05-05: 100 mL via INTRAVENOUS

## 2012-05-05 MED ORDER — PACLITAXEL CHEMO INJECTION 300 MG/50ML
80.0000 mg/m2 | Freq: Once | INTRAVENOUS | Status: AC
  Administered 2012-05-05: 138 mg via INTRAVENOUS
  Filled 2012-05-05: qty 23

## 2012-05-05 MED ORDER — INSULIN REGULAR HUMAN 100 UNIT/ML IJ SOLN
8.0000 [IU] | INTRAMUSCULAR | Status: DC
  Administered 2012-05-05: 8 [IU] via SUBCUTANEOUS
  Filled 2012-05-05: qty 0.08

## 2012-05-05 MED ORDER — DEXAMETHASONE SODIUM PHOSPHATE 4 MG/ML IJ SOLN
20.0000 mg | Freq: Once | INTRAMUSCULAR | Status: AC
  Administered 2012-05-05: 20 mg via INTRAVENOUS

## 2012-05-05 MED ORDER — DIPHENHYDRAMINE HCL 50 MG/ML IJ SOLN
50.0000 mg | Freq: Once | INTRAMUSCULAR | Status: AC
  Administered 2012-05-05: 50 mg via INTRAVENOUS

## 2012-05-05 MED ORDER — FAMOTIDINE IN NACL 20-0.9 MG/50ML-% IV SOLN
20.0000 mg | Freq: Once | INTRAVENOUS | Status: AC
  Administered 2012-05-05: 20 mg via INTRAVENOUS

## 2012-05-05 MED ORDER — ONDANSETRON 16 MG/50ML IVPB (CHCC)
16.0000 mg | Freq: Once | INTRAVENOUS | Status: AC
  Administered 2012-05-05: 16 mg via INTRAVENOUS

## 2012-05-05 NOTE — Patient Instructions (Addendum)
[facility] Discharge Instructions for Patients Receiving Chemotherapy  Today you received the following chemotherapy agents Taxol  To help prevent nausea and vomiting after your treatment, we encourage you to take your nausea medication as prescribed.  If you develop nausea and vomiting that is not controlled by your nausea medication, call the clinic. If it is after clinic hours your family physician or the after hours number for the clinic or go to the Emergency Department.   BELOW ARE SYMPTOMS THAT SHOULD BE REPORTED IMMEDIATELY:  *FEVER GREATER THAN 100.5 F  *CHILLS WITH OR WITHOUT FEVER  NAUSEA AND VOMITING THAT IS NOT CONTROLLED WITH YOUR NAUSEA MEDICATION  *UNUSUAL SHORTNESS OF BREATH  *UNUSUAL BRUISING OR BLEEDING  TENDERNESS IN MOUTH AND THROAT WITH OR WITHOUT PRESENCE OF ULCERS  *URINARY PROBLEMS  *BOWEL PROBLEMS  UNUSUAL RASH Items with * indicate a potential emergency and should be followed up as soon as possible.  One of the nurses will contact you 24 hours after your treatment. Please let the nurse know about any problems that you may have experienced. Feel free to call the clinic you have any questions or concerns. The clinic phone number is [phone].   I have been informed and understand all the instructions given to me. I know to contact the clinic, my physician, or go to the Emergency Department if any problems should occur. I do not have any questions at this time, but understand that I may call the clinic during office hours   should I have any questions or need assistance in obtaining follow up care.    __________________________________________  _____________  __________ Signature of Patient or Authorized Representative            Date                   Time    __________________________________________ Nurse's Signature    

## 2012-05-08 ENCOUNTER — Other Ambulatory Visit: Payer: Self-pay | Admitting: Oncology

## 2012-05-08 NOTE — Progress Notes (Signed)
Orders entered on new careplan to begin 05-26-12. Will use carbo at AUC =3 and taxol 80 mg/m2 day 1 and taxol 80 mg /m2 day 8 (no day 15 treatment). 05-26-12 orders signed now but may need adjustment depending on upcoming restaging scans and gyn oncology recommendations prior to 05-26-12.  Ila Mcgill, MD

## 2012-05-12 ENCOUNTER — Ambulatory Visit (HOSPITAL_COMMUNITY)
Admission: RE | Admit: 2012-05-12 | Discharge: 2012-05-12 | Disposition: A | Discharge status: Home / Self Care | Payer: Medicare Other | Source: Ambulatory Visit | Attending: Oncology | Admitting: Oncology

## 2012-05-12 DIAGNOSIS — C569 Malignant neoplasm of unspecified ovary: Secondary | ICD-10-CM

## 2012-05-13 ENCOUNTER — Other Ambulatory Visit: Payer: Self-pay | Admitting: *Deleted

## 2012-05-13 DIAGNOSIS — C569 Malignant neoplasm of unspecified ovary: Secondary | ICD-10-CM

## 2012-05-13 MED ORDER — LORAZEPAM 0.5 MG PO TABS: ORAL_TABLET | ORAL | Status: DC

## 2012-05-15 ENCOUNTER — Other Ambulatory Visit: Payer: Self-pay | Admitting: Oncology

## 2012-05-19 ENCOUNTER — Encounter: Payer: Self-pay | Admitting: Gynecologic Oncology

## 2012-05-19 ENCOUNTER — Encounter: Payer: Medicare Other | Admitting: Nutrition

## 2012-05-19 ENCOUNTER — Encounter: Payer: Self-pay | Admitting: Nutrition

## 2012-05-19 ENCOUNTER — Ambulatory Visit: Payer: Medicare Other | Attending: Gynecologic Oncology | Admitting: Gynecologic Oncology

## 2012-05-19 VITALS — BP 136/76 | HR 80 | Temp 98.4°F | Resp 16 | Ht 66.0 in | Wt 152.3 lb

## 2012-05-19 DIAGNOSIS — C786 Secondary malignant neoplasm of retroperitoneum and peritoneum: Secondary | ICD-10-CM | POA: Insufficient documentation

## 2012-05-19 DIAGNOSIS — E119 Type 2 diabetes mellitus without complications: Secondary | ICD-10-CM | POA: Insufficient documentation

## 2012-05-19 DIAGNOSIS — C569 Malignant neoplasm of unspecified ovary: Secondary | ICD-10-CM

## 2012-05-19 DIAGNOSIS — Z800 Family history of malignant neoplasm of digestive organs: Secondary | ICD-10-CM | POA: Insufficient documentation

## 2012-05-19 DIAGNOSIS — I10 Essential (primary) hypertension: Secondary | ICD-10-CM | POA: Insufficient documentation

## 2012-05-19 NOTE — Progress Notes (Signed)
Office Visit Note: Gyn-Onc    Ann Baldwin 66 y.o. female  CC:  Chief Complaint  Patient presents with  . Peritoneal Carcinoma    Follow up    HPI: 66 y/o G2P2 LNMP 40's  presented in April 2013 with abdominal discomfort, diarrhea, nausea, no constipation  that was thought to be IBD. The symptoms persisted and she was referred for colonoscopy 01/26/2012.  Findings were notable for  A large ulcer with heaped up margins in the ascending colon.  It was difficult to see and to obtain biopsies.    Pathology was c/w adenocarcinoma and the concern that it was not a primary colonic adenocarcinoma.    CT abdomen and pelvis 02/01/2012 with a 5x6.2 cmcomplex solid and cystic left adnexal mass, 3.9x2.9 right ovarian soft tissue mass, omental caking and peritoneal carcinomatosis.    The initial CA 125 returned > 22,000 CEA wnl, immuno stains of the biopsy were not c/w colon cancer.  Cr was > 8 and the patient was admitted with acute renal failure. Cytology from paracentesis was c/w Gyn primary. She has received 6 cycles of dose dense Taxol/carboplatin. Cycle 4 was administered on 04/28/2012. A CT scan of the abdomen and pelvis collected on 05/12/2012 IMPRESSION:  1. Interval response to therapy. There has been near complete resolution of abdominal ascites and peritoneal nodularity. 2. Interval decrease in the size of bilateral adnexal cystic lesions. 3. Small soft tissue attenuating nodule arising from the uterine fundus is unchanged in size from previous exam and is favored to represent a subserosal (exophytic) fibroid. 4. No new or progressive disease identified. 5. Small subpleural nodule in the right middle lobe is stable from previous exam.   CA 125 04/25/2012 409.7   Past Surgical Hx:  Past Surgical History  Procedure Date  . No past surgeries     Past Medical Hx:  Past Medical History  Diagnosis Date  . Diabetes mellitus   . Hyperlipidemia   . Hypertension   . Ovarian cancer   Last  mammogram 2 years ago wnl.  Past Gynecological History: G2 P2 LNMP in 40's.  Menarche 12 regular menses until menopause.  No contraceptives used.  No h/o abnormal pap test, Last pap 10 years ago.  NSVD x 2.  No LMP recorded. Patient is postmenopausal.  Family Hx:  Family History  Problem Relation Age of Onset  . Colon cancer Neg Hx   . Esophageal cancer Neg Hx   . Stomach cancer Neg Hx   . Rectal cancer Neg Hx     Review of Systems:  Constitutional  Feels much better, increase energy Cardiovascular  No chest pain, shortness of breath,  Pulmonary  No cough or wheeze.  Gastro Intestinal  Denies nausea,  No bright red blood per rectum.  No constipation.  Genito Urinary  No frequency, urgency, dysuria, no vaginal bleeding or discharge Musculo Skeletal  No myalgia, arthralgia, or pain  Neurologic  No weakness, numbness, change in gait,  Psychology  No depression, anxiety,difficulty sleeping.   Vitals:  Blood pressure 136/76, pulse 80, temperature 98.4 F (36.9 C), resp. rate 16, height 5\' 6"  (1.676 m), weight 152 lb 4.8 oz (69.083 kg).  Physical Exam: Thin WD in NAD.  Energetic and participatory in the conversation Chest: Clear to auscultation Heart: Regular rate and rhythm normal S1-S2 Abdomen: Soft obese no palpable fluid wave no palpable omental cake no rebound or guarding Back: no CVA tenderness Lymph node: No cervical supraclavicular or inguinal adenopathy Pelvic: Normal  external genitalia Bartholin's urethra and Skene's glands. Vagina atrophic no lesions visible normal appearing cervix no cul-de-sac nodularity or parametrial nodularity Rectal: Good sphincter tone no palpable masses; Extremities: 1+ lower extremity edema bilaterally numerous bruises at sites of previous phlebotomy. Assessment/Plan:  Ann Baldwin  is a 66 y.o.  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 S/p resolution of multifactorial ARF.  After 4 cycles of chemotherapy her CA 125 is 409.7. Imaging suggests a marked reduction in the volume of malignancy. On pelvic examination the pelvic masses are not assessable.   The options presented to the patient and her family today were that of interval debulking within 4-6 weeks. Given the previous connection between the GYN organs and the ascending colon, there is a small chance of a bowel resection requiring diversion.  An option was provided for debulking after 6 cycles of therapy.  Given the likelihood that the extent of dissection would likely be much less at that time, the patient and her family opted for debulking after 6 cycles of therapy. This also would allow for a great 2013 holiday season.  Followup with Dr. Darrold Span for cycles 5 and 6 of chemotherapy as previously scheduled  Followup in 07/20/2011 for preop followup January 14 for laparotomy total abdominal hysterectomy bilateral salpingo-oophorectomy possible bowel resection omentectomy and debulking       Laurette Schimke, MD, PhD 05/19/2012, 12:47 PM

## 2012-05-19 NOTE — Progress Notes (Signed)
Patient did not show up for nutrition appointment. 

## 2012-05-20 ENCOUNTER — Other Ambulatory Visit: Payer: Self-pay | Admitting: Oncology

## 2012-05-20 DIAGNOSIS — C569 Malignant neoplasm of unspecified ovary: Secondary | ICD-10-CM

## 2012-05-20 NOTE — Progress Notes (Signed)
Did not get labs drawn 05-19-12 as requested, so orders put in now for CBC finger + STAT CMET +ca125 day of chemo 05-26-12. Note patient lives > hour drive away and does not have PAC.  Ila Mcgill, MD

## 2012-05-23 ENCOUNTER — Telehealth: Payer: Self-pay | Admitting: Oncology

## 2012-05-23 ENCOUNTER — Telehealth: Payer: Self-pay

## 2012-05-23 NOTE — Telephone Encounter (Signed)
l/m with 11/14 alb appt    aom

## 2012-05-23 NOTE — Telephone Encounter (Signed)
Ms. Frick said that Dr. Nelly Rout did discuss a PAC placement as she was stuck 6 times with last treatment.  Dr. Nelly Rout did not encourage the [facility] since she has 2 treatments left.  Ms. Mcmasters is comfortable with her peripherial veins at this time.  Dr. Darrold Span notified of this decision.

## 2012-05-24 ENCOUNTER — Encounter: Payer: Self-pay | Admitting: Oncology

## 2012-05-24 NOTE — Progress Notes (Signed)
Med Onc  Communication from gyn oncology that Dr Nelly Rout will see patient back on 07-19-12 for evaluation for surgery on 07-27-11.   Dr Nelly Rout also felt patient needed [facility], however patient refused this in conversation with my RN yesterday.  Ila Mcgill, MD

## 2012-05-26 ENCOUNTER — Other Ambulatory Visit (HOSPITAL_BASED_OUTPATIENT_CLINIC_OR_DEPARTMENT_OTHER): Payer: Medicare Other | Admitting: Lab

## 2012-05-26 ENCOUNTER — Ambulatory Visit (HOSPITAL_BASED_OUTPATIENT_CLINIC_OR_DEPARTMENT_OTHER): Payer: Medicare Other

## 2012-05-26 ENCOUNTER — Ambulatory Visit: Payer: Medicare Other | Admitting: Nutrition

## 2012-05-26 VITALS — BP 178/76 | HR 112 | Temp 97.7°F | Resp 18

## 2012-05-26 DIAGNOSIS — C569 Malignant neoplasm of unspecified ovary: Secondary | ICD-10-CM

## 2012-05-26 DIAGNOSIS — E119 Type 2 diabetes mellitus without complications: Secondary | ICD-10-CM

## 2012-05-26 DIAGNOSIS — Z5111 Encounter for antineoplastic chemotherapy: Secondary | ICD-10-CM

## 2012-05-26 LAB — CBC WITH DIFFERENTIAL/PLATELET
BASO%: 0.1 % (ref 0.0–2.0)
Basophils Absolute: 0 10*3/uL (ref 0.0–0.1)
HCT: 37 % (ref 34.8–46.6)
HGB: 12.4 g/dL (ref 11.6–15.9)
MONO#: 0 10*3/uL — ABNORMAL LOW (ref 0.1–0.9)
NEUT%: 91.2 % — ABNORMAL HIGH (ref 38.4–76.8)
RDW: 16.1 % — ABNORMAL HIGH (ref 11.2–14.5)
WBC: 7.4 10*3/uL (ref 3.9–10.3)
lymph#: 0.6 10*3/uL — ABNORMAL LOW (ref 0.9–3.3)

## 2012-05-26 LAB — COMPREHENSIVE METABOLIC PANEL (CC13)
ALT: 27 U/L (ref 0–55)
Albumin: 3.6 g/dL (ref 3.5–5.0)
Alkaline Phosphatase: 77 U/L (ref 40–150)
Glucose: 427 mg/dl — ABNORMAL HIGH (ref 70–99)
Potassium: 4.2 mEq/L (ref 3.5–5.1)
Sodium: 138 mEq/L (ref 136–145)
Total Protein: 7.1 g/dL (ref 6.4–8.3)

## 2012-05-26 LAB — WHOLE BLOOD GLUCOSE

## 2012-05-26 MED ORDER — INSULIN REGULAR HUMAN 100 UNIT/ML IJ SOLN
6.0000 [IU] | Freq: Once | INTRAMUSCULAR | Status: AC
  Administered 2012-05-26: 6 [IU] via SUBCUTANEOUS
  Filled 2012-05-26: qty 0.06

## 2012-05-26 MED ORDER — SODIUM CHLORIDE 0.9 % IV SOLN
Freq: Once | INTRAVENOUS | Status: AC
  Administered 2012-05-26: 11:00:00 via INTRAVENOUS

## 2012-05-26 MED ORDER — SODIUM CHLORIDE 0.9 % IJ SOLN
10.0000 mL | INTRAMUSCULAR | Status: DC | PRN
  Filled 2012-05-26: qty 10

## 2012-05-26 MED ORDER — PACLITAXEL CHEMO INJECTION 300 MG/50ML
80.0000 mg/m2 | Freq: Once | INTRAVENOUS | Status: AC
  Administered 2012-05-26: 138 mg via INTRAVENOUS
  Filled 2012-05-26: qty 23

## 2012-05-26 MED ORDER — DIPHENHYDRAMINE HCL 50 MG/ML IJ SOLN
50.0000 mg | Freq: Once | INTRAMUSCULAR | Status: AC
  Administered 2012-05-26: 50 mg via INTRAVENOUS

## 2012-05-26 MED ORDER — HEPARIN SOD (PORK) LOCK FLUSH 100 UNIT/ML IV SOLN
500.0000 [IU] | Freq: Once | INTRAVENOUS | Status: DC | PRN
  Filled 2012-05-26: qty 5

## 2012-05-26 MED ORDER — FAMOTIDINE IN NACL 20-0.9 MG/50ML-% IV SOLN
20.0000 mg | Freq: Once | INTRAVENOUS | Status: AC
  Administered 2012-05-26: 20 mg via INTRAVENOUS

## 2012-05-26 MED ORDER — ONDANSETRON 16 MG/50ML IVPB (CHCC)
16.0000 mg | Freq: Once | INTRAVENOUS | Status: AC
  Administered 2012-05-26: 16 mg via INTRAVENOUS

## 2012-05-26 MED ORDER — SODIUM CHLORIDE 0.9 % IV SOLN
247.2000 mg | Freq: Once | INTRAVENOUS | Status: AC
  Administered 2012-05-26: 250 mg via INTRAVENOUS
  Filled 2012-05-26: qty 25

## 2012-05-26 MED ORDER — DEXAMETHASONE SODIUM PHOSPHATE 4 MG/ML IJ SOLN
20.0000 mg | Freq: Once | INTRAMUSCULAR | Status: AC
  Administered 2012-05-26: 20 mg via INTRAVENOUS

## 2012-05-26 NOTE — Patient Instructions (Signed)
[facility] Discharge Instructions for Patients Receiving Chemotherapy  Today you received the following chemotherapy agents Taxol and Carboplatin  To help prevent nausea and vomiting after your treatment, we encourage you to take your nausea medication as prescribed.   If you develop nausea and vomiting that is not controlled by your nausea medication, call the clinic. If it is after clinic hours your family physician or the after hours number for the clinic or go to the Emergency Department.   BELOW ARE SYMPTOMS THAT SHOULD BE REPORTED IMMEDIATELY:  *FEVER GREATER THAN 100.5 F  *CHILLS WITH OR WITHOUT FEVER  NAUSEA AND VOMITING THAT IS NOT CONTROLLED WITH YOUR NAUSEA MEDICATION  *UNUSUAL SHORTNESS OF BREATH  *UNUSUAL BRUISING OR BLEEDING  TENDERNESS IN MOUTH AND THROAT WITH OR WITHOUT PRESENCE OF ULCERS  *URINARY PROBLEMS  *BOWEL PROBLEMS  UNUSUAL RASH Items with * indicate a potential emergency and should be followed up as soon as possible.  Feel free to call the clinic you have any questions or concerns. The clinic phone number is [phone].   I have been informed and understand all the instructions given to me. I know to contact the clinic, my physician, or go to the Emergency Department if any problems should occur. I do not have any questions at this time, but understand that I may call the clinic during office hours   should I have any questions or need assistance in obtaining follow up care.    

## 2012-05-26 NOTE — Progress Notes (Signed)
1340--Spoke to [employer], PA-C, will not cover pt at d/c with insulin since 6 units had been given at 1314, pt is to check CBG tonight and to cover with insulin per Dr Precious Reel sliding scale.   Pt and pt's family members verbalized understanding.  Pt's d/c'd home, ambulatory, no complaints.  SLJ

## 2012-05-26 NOTE — Progress Notes (Signed)
This is a 66 year old female patient of Dr. Darrold Span diagnosed with ovarian cancer receiving neoadjuvant chemotherapy.  Past medical history includes acute renal failure, diabetes, ascites, hypertension, and hyperlipidemia.  Medications include Lipitor, Decadron, Colace, Humulin, Ativan, Zofran, MiraLax, and Senokot.  Labs were reviewed.  Height: 63 inches. Weight: 152.3 pounds November 7. Usual body weight 174 pounds per patient. BMI: 26.97.  Patient reports a great appetite. She is eating often throughout the day and is drinking one Ensure daily. She denies problems with nausea vomiting or any bowel issues. Her ascites has improved. She has really work to increase water this past week. Weight reflects approximately 3 and half pound weight gain in the past 3 weeks. Patient attributes this to steroids.  Nutrition diagnosis: Food and nutrition related knowledge deficit related to diagnosis of ovarian cancer and associated treatments as evidenced by no prior need for nutrition related information.  Intervention: I educated patient and daughters on strategies for smaller more frequent meals utilizing higher calorie, higher protein foods to promote weight maintenance. I have encouraged patient to continue drinking ensure daily. I have educated her about changing oral nutrition supplements to Ensure Plus if patient is unable to maintain weight. I provided fact sheets and contact information.  Monitoring, evaluation, goals: Patient will tolerate oral diet to promote weight maintenance of lean body mass.  Next visit: I will attempt followup with patient during chemotherapy in December.

## 2012-05-26 NOTE — Progress Notes (Signed)
Insulin coverage given for BS 365 on arrival to [facility] given at 1314.  Lab chairside check repeat blood glucose at 1330.  Repeat CBG 356.  Will address with Tiana Loft, PA to determine need for repeat coverage-dhp, rn

## 2012-05-27 LAB — CA 125: CA 125: 97.9 U/mL — ABNORMAL HIGH (ref 0.0–30.2)

## 2012-05-30 ENCOUNTER — Telehealth: Payer: Self-pay

## 2012-05-30 NOTE — Telephone Encounter (Signed)
Message copied by Lorine Bears on Mon May 30, 2012  2:04 PM ------      Message from: Reece Packer      Created: Sat May 28, 2012  7:37 PM       Labs seen and need follow up: please let her know marker better again, now 74

## 2012-05-30 NOTE — Telephone Encounter (Signed)
Told Ann Baldwin about her ca-125 as noted below by Dr. Darrold Span.

## 2012-06-02 ENCOUNTER — Ambulatory Visit (HOSPITAL_BASED_OUTPATIENT_CLINIC_OR_DEPARTMENT_OTHER): Payer: Medicare Other

## 2012-06-02 ENCOUNTER — Other Ambulatory Visit (HOSPITAL_BASED_OUTPATIENT_CLINIC_OR_DEPARTMENT_OTHER): Payer: Medicare Other | Admitting: Lab

## 2012-06-02 ENCOUNTER — Other Ambulatory Visit: Payer: Self-pay | Admitting: Gynecologic Oncology

## 2012-06-02 VITALS — BP 158/78 | HR 106 | Temp 98.5°F | Resp 20

## 2012-06-02 DIAGNOSIS — E119 Type 2 diabetes mellitus without complications: Secondary | ICD-10-CM

## 2012-06-02 DIAGNOSIS — Z5111 Encounter for antineoplastic chemotherapy: Secondary | ICD-10-CM

## 2012-06-02 DIAGNOSIS — C569 Malignant neoplasm of unspecified ovary: Secondary | ICD-10-CM

## 2012-06-02 LAB — CBC WITH DIFFERENTIAL/PLATELET
Basophils Absolute: 0 10*3/uL (ref 0.0–0.1)
Eosinophils Absolute: 0 10*3/uL (ref 0.0–0.5)
HCT: 37 % (ref 34.8–46.6)
HGB: 12.5 g/dL (ref 11.6–15.9)
LYMPH%: 5 % — ABNORMAL LOW (ref 14.0–49.7)
MCV: 95.9 fL (ref 79.5–101.0)
MONO%: 0.3 % (ref 0.0–14.0)
NEUT#: 9.3 10*3/uL — ABNORMAL HIGH (ref 1.5–6.5)
NEUT%: 94.5 % — ABNORMAL HIGH (ref 38.4–76.8)
Platelets: 261 10*3/uL (ref 145–400)
RBC: 3.86 10*6/uL (ref 3.70–5.45)

## 2012-06-02 LAB — WHOLE BLOOD GLUCOSE: Glucose: 333 mg/dL — ABNORMAL HIGH (ref 70–100)

## 2012-06-02 MED ORDER — FAMOTIDINE IN NACL 20-0.9 MG/50ML-% IV SOLN
20.0000 mg | Freq: Once | INTRAVENOUS | Status: AC
  Administered 2012-06-02: 20 mg via INTRAVENOUS

## 2012-06-02 MED ORDER — SODIUM CHLORIDE 0.9 % IV SOLN
500.0000 mL | Freq: Once | INTRAVENOUS | Status: AC
  Administered 2012-06-02: 500 mL via INTRAVENOUS

## 2012-06-02 MED ORDER — INSULIN REGULAR HUMAN 100 UNIT/ML IJ SOLN
6.0000 [IU] | Freq: Once | INTRAMUSCULAR | Status: AC
  Administered 2012-06-02: 6 [IU] via SUBCUTANEOUS
  Filled 2012-06-02: qty 0.06

## 2012-06-02 MED ORDER — DEXAMETHASONE SODIUM PHOSPHATE 4 MG/ML IJ SOLN
20.0000 mg | Freq: Once | INTRAMUSCULAR | Status: AC
  Administered 2012-06-02: 20 mg via INTRAVENOUS

## 2012-06-02 MED ORDER — ONDANSETRON 8 MG/50ML IVPB (CHCC)
8.0000 mg | Freq: Once | INTRAVENOUS | Status: AC
  Administered 2012-06-02: 8 mg via INTRAVENOUS

## 2012-06-02 MED ORDER — DIPHENHYDRAMINE HCL 50 MG/ML IJ SOLN
50.0000 mg | Freq: Once | INTRAMUSCULAR | Status: AC
  Administered 2012-06-02: 50 mg via INTRAVENOUS

## 2012-06-02 MED ORDER — PACLITAXEL CHEMO INJECTION 300 MG/50ML
80.0000 mg/m2 | Freq: Once | INTRAVENOUS | Status: AC
  Administered 2012-06-02: 138 mg via INTRAVENOUS
  Filled 2012-06-02: qty 23

## 2012-06-02 NOTE — Patient Instructions (Addendum)
[facility] Discharge Instructions for Patients Receiving Chemotherapy  Today you received the following chemotherapy agents: Taxol. To help prevent nausea and vomiting after your treatment, we encourage you to take your nausea medication, Zofran. Begin taking it at bedtime and take it as often as prescribed for the next 48 hours.   If you develop nausea and vomiting that is not controlled by your nausea medication, call the clinic. If it is after clinic hours your family physician or the after hours number for the clinic or go to the Emergency Department.   BELOW ARE SYMPTOMS THAT SHOULD BE REPORTED IMMEDIATELY:  *FEVER GREATER THAN 100.5 F  *CHILLS WITH OR WITHOUT FEVER  NAUSEA AND VOMITING THAT IS NOT CONTROLLED WITH YOUR NAUSEA MEDICATION  *UNUSUAL SHORTNESS OF BREATH  *UNUSUAL BRUISING OR BLEEDING  TENDERNESS IN MOUTH AND THROAT WITH OR WITHOUT PRESENCE OF ULCERS  *URINARY PROBLEMS  *BOWEL PROBLEMS  UNUSUAL RASH Items with * indicate a potential emergency and should be followed up as soon as possible.  Please let the nurse know about any problems that you may have experienced. Feel free to call the clinic you have any questions or concerns. The clinic phone number is [phone].   I have been informed and understand all the instructions given to me. I know to contact the clinic, my physician, or go to the Emergency Department if any problems should occur. I do not have any questions at this time, but understand that I may call the clinic during office hours   should I have any questions or need assistance in obtaining follow up care.    __________________________________________  _____________  __________ Signature of Patient or Authorized Representative            Date                   Time    __________________________________________ Nurse's Signature

## 2012-06-03 ENCOUNTER — Other Ambulatory Visit: Payer: Self-pay | Admitting: *Deleted

## 2012-06-03 DIAGNOSIS — C569 Malignant neoplasm of unspecified ovary: Secondary | ICD-10-CM

## 2012-06-03 MED ORDER — LORAZEPAM 0.5 MG PO TABS: ORAL_TABLET | ORAL | Status: DC

## 2012-06-10 ENCOUNTER — Telehealth: Payer: Self-pay

## 2012-06-10 NOTE — Telephone Encounter (Signed)
Ann Baldwin was calling to see what was entailed for the visit with Dr. Darrold Span 06-14-12.  Told her it was to see how Ann Baldwin is doing prior to her chemotherapy on 06-16-12.  Ann Baldwin stated that Dr. Nelly Rout was not going to do any more chemotherapy.  Ann Baldwin was going to see Br. Brewster on 07-20-11 and have  surgery 07-26-12.   Ann Baldwin has something important to her  that she wants to do on 06-14-12.  Told Ann Baldwin that the offiice note need to be reviewed and would call her back. Discussed notes with Dr. Darrold Span  And Dr. Forrestine Him 05-19-12 note summarizes the discussion of options and the family agreed to cycle 6 of chemotherapy as this may help decrease the extent of the surgical debulking. Attempted to reach Ann Baldwin at home where is was going to be and got vm.   The plan of care needs to be clarified on 06-13-12 with gyn onc as they are not in the office this afternoon.

## 2012-06-13 ENCOUNTER — Telehealth: Payer: Self-pay | Admitting: Oncology

## 2012-06-13 NOTE — Telephone Encounter (Signed)
daughter called to cx her 12/3 appt,but i sent a note to dr ll as the pt has tx on 12/5 and for her to advise      anne

## 2012-06-13 NOTE — Telephone Encounter (Signed)
Telford Nab RN with Gyn Onc called this nurse and stated that Ms. Moret will keep appointment with Dr. Darrold Span tomorrow as well as chemotherapy on 06-16-12 as she missunderstood how many chemotherapy treatments were in a cycle.

## 2012-06-14 ENCOUNTER — Other Ambulatory Visit: Payer: Self-pay | Admitting: *Deleted

## 2012-06-14 ENCOUNTER — Encounter: Payer: Self-pay | Admitting: Oncology

## 2012-06-14 ENCOUNTER — Other Ambulatory Visit (HOSPITAL_BASED_OUTPATIENT_CLINIC_OR_DEPARTMENT_OTHER): Payer: Medicare Other | Admitting: Lab

## 2012-06-14 ENCOUNTER — Ambulatory Visit (HOSPITAL_BASED_OUTPATIENT_CLINIC_OR_DEPARTMENT_OTHER): Payer: Medicare Other | Admitting: Oncology

## 2012-06-14 VITALS — BP 124/78 | HR 93 | Temp 97.0°F | Resp 18 | Ht 66.0 in | Wt 157.7 lb

## 2012-06-14 DIAGNOSIS — C569 Malignant neoplasm of unspecified ovary: Secondary | ICD-10-CM

## 2012-06-14 DIAGNOSIS — E119 Type 2 diabetes mellitus without complications: Secondary | ICD-10-CM

## 2012-06-14 DIAGNOSIS — I10 Essential (primary) hypertension: Secondary | ICD-10-CM

## 2012-06-14 LAB — CBC WITH DIFFERENTIAL/PLATELET
Basophils Absolute: 0 10*3/uL (ref 0.0–0.1)
EOS%: 1.2 % (ref 0.0–7.0)
LYMPH%: 24.4 % (ref 14.0–49.7)
MCH: 34.8 pg — ABNORMAL HIGH (ref 25.1–34.0)
MCV: 100.5 fL (ref 79.5–101.0)
MONO%: 11.9 % (ref 0.0–14.0)
Platelets: 249 10*3/uL (ref 145–400)
RBC: 3.23 10*6/uL — ABNORMAL LOW (ref 3.70–5.45)
RDW: 15.1 % — ABNORMAL HIGH (ref 11.2–14.5)

## 2012-06-14 LAB — COMPREHENSIVE METABOLIC PANEL (CC13)
Alkaline Phosphatase: 63 U/L (ref 40–150)
CO2: 26 mEq/L (ref 22–29)
Creatinine: 0.8 mg/dL (ref 0.6–1.1)
Glucose: 143 mg/dl — ABNORMAL HIGH (ref 70–99)
Sodium: 142 mEq/L (ref 136–145)
Total Bilirubin: 0.25 mg/dL (ref 0.20–1.20)

## 2012-06-14 LAB — CA 125: CA 125: 34.7 U/mL — ABNORMAL HIGH (ref 0.0–30.2)

## 2012-06-14 MED ORDER — DEXAMETHASONE 4 MG PO TABS: ORAL_TABLET | ORAL | Status: DC

## 2012-06-14 NOTE — Patient Instructions (Addendum)
Call if very fatigued after this chemo. Call before next week treatment if numbness in hand is worse

## 2012-06-14 NOTE — Telephone Encounter (Signed)
appts made and printed for pt  °

## 2012-06-14 NOTE — Progress Notes (Signed)
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 She had CT 05-12-12 with good interval response, CA 125 has dropped from >22,000 initially to 97 on 05-26-12 and Dr Nelly Rout now plans interval debulking in Jan.  Patient had presented with GI symptoms thought IBD in April 2013. She had colonoscopy by Dr Stan Head 01-26-12, with large ulcer in ascending colon, pathology [phone] found adenocarcinoma consistent with noncolonic primary. CT AP (with contrast) in [facility] 02-02-12 had complex solid and cystic left adnexal mass, right ovarian mass, omental caking and peritoneal carcinomatosis. She was seen by Dr Nelly Rout on 02-09-12, with CA 125 22,938 and CEA 0.5; CMET resulted after Dr Forrestine Him visit had BUN 63 and creatinine 8.78. She was admitted to [facility] 7-31 thru 02-21-12 with acute renal failure felt multifactorial including prerenal with N/V/diarrhea/ascites, NSAID and ACE inhibitors, lasix, IV contrast, HTN and DM. Renal function progressively improved, allowing addition of careful carboplatin to taxol beginning cycle 3 chemotherapy. She had paracentesis of 5 liters on 02-11-12 and 2.4 liters on 02-18-12. CA 125, which was 22,938 at presentation, was down to 1835 prior to cycle 4 (lab done 04-04-12) and is 409 by lab resulting after visit today. She had first 2 cycles of weekly taxol as single agent following the renal problems on presentation,  first cycle started on 02-24-12, tolerated adequately tho blood sugars elevate significantly with her diabetes and the steroids. She had carboplatin added day 1 beginning cycle 3 (04-07-12) with taxol again day 1 and day 8. Cycle 5 was given 11-14 and 11-21; she has not had gCSF.  Patient is fatigued x ~ 3 days after each  chemotherapy, but otherwise has been fairly active at home and had planned to go shopping with sister today. She has not had significant nausea and appetite has been good. She has tingling sensation in left fingers only, dropped a jar once but otherwise no difficulty using that hand, no other peripheral neuropathy symptoms. The hand symptoms improve with exercising the hand. Peripheral IV access is difficult, with multiple sticks required by most nurses, tho Myriam Jacobson has had good success. She has had no fever or symptoms of infection. She denies SOB or cough. Bowels and bladder are ok. No abdominal or pelvic symptoms. Remainder of 10 point Review of Systems negative.  We have discussed PICC for next 2 treatment, tho she would prefer not to have this and tells me she does not mind multiple sticks. I have told her to use squeeze balls as much as possible between treatments in addition to good hydration. If she needs further chemo after surgery, she is willing to have PAC then. Objective:  Vital signs in last 24 hours:  BP 124/78  Pulse 93  Temp 97 F (36.1 C) (Oral)  Resp 18  Ht 5\' 6"  (1.676 m)  Wt 157 lb 11.2 oz (71.532 kg)  BMI 25.45 kg/m2 Using WC for length of office halls, tho ambulatory at home. Looks comfortable.   HEENT:PERRLA, sclera clear, anicteric and oropharynx clear, no lesions LymphaticsCervical, supraclavicular, and axillary nodes normal. Resp: clear to auscultation bilaterally and normal percussion bilaterally Cardio: regular rate and rhythm GI: soft, non-tender; bowel sounds normal; no masses,  no organomegaly Extremities: extremities normal, atraumatic, no cyanosis or edema Neuro:hand grip 3+ and equal bilaterally. Sensation  intact to light touch left fingers. Skin without rash or ecchymosis including at sites of IVs.  Lab Results:  Results for orders placed in visit on 06/14/12  CBC WITH DIFFERENTIAL      Component Value Range   WBC 2.5 (*) 3.9 - 10.3 10e3/uL   NEUT#  1.5  1.5 - 6.5 10e3/uL   HGB 11.3 (*) 11.6 - 15.9 g/dL   HCT 78.2 (*) 95.6 - 21.3 %   Platelets 249  145 - 400 10e3/uL   MCV 100.5  79.5 - 101.0 fL   MCH 34.8 (*) 25.1 - 34.0 pg   MCHC 34.6  31.5 - 36.0 g/dL   RBC 0.86 (*) 5.78 - 4.69 10e6/uL   RDW 15.1 (*) 11.2 - 14.5 %   lymph# 0.6 (*) 0.9 - 3.3 10e3/uL   MONO# 0.3  0.1 - 0.9 10e3/uL   Eosinophils Absolute 0.0  0.0 - 0.5 10e3/uL   Basophils Absolute 0.0  0.0 - 0.1 10e3/uL   NEUT% 61.6  38.4 - 76.8 %   LYMPH% 24.4  14.0 - 49.7 %   MONO% 11.9  0.0 - 14.0 %   EOS% 1.2  0.0 - 7.0 %   BASO% 0.9  0.0 - 2.0 %  COMPREHENSIVE METABOLIC PANEL (CC13)      Component Value Range   Sodium 142  136 - 145 mEq/L   Potassium 4.3  3.5 - 5.1 mEq/L   Chloride 107  98 - 107 mEq/L   CO2 26  22 - 29 mEq/L   Glucose 143 (*) 70 - 99 mg/dl   BUN 62.9  7.0 - 52.8 mg/dL   Creatinine 0.8  0.6 - 1.1 mg/dL   Total Bilirubin 4.13  0.20 - 1.20 mg/dL   Alkaline Phosphatase 63  40 - 150 U/L   AST 17  5 - 34 U/L   ALT 30  0 - 55 U/L   Total Protein 6.0 (*) 6.4 - 8.3 g/dL   Albumin 3.3 (*) 3.5 - 5.0 g/dL   Calcium 9.1  8.4 - 24.4 mg/dL    CA 010 available after visit 35 Studies/Results:  No results found.  Medications: I have reviewed the patient's current medications. Premed for taxol is decadron 20 mg 12 hrs prior.   Patient understands that ANC is just at level to allow treatment day 1 on 06-16-12. She wants to go ahead with treatment that day and does not want to come day prior to day 8 to be sure counts are ok for steroids/day 8 treatment. If counts on day 8 have ANC <1.5 or plt <100k, we will omit day 8 and instead she should have neulasta while at office that day. I have explained again that day 1 carbo + taxol then day 8 taxol equals one cycle of treatment. They understand that we feel it is safer to give this next chemo cycle as planned as the planned surgery is not until Jan and certainly we do not want the cancer to increase during a treatment  break now. Patient and family seem to understand this discussion now.  Assessment/Plan: 1. 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 Plan one additional cycle as long as counts tolerate, then interval debulking by Dr Nelly Rout in [place] 2.Diabetes on insulin: covering elevated glucoses related to chemo steroids with sliding scale.  3. Multifactorial acute renal failure around diagnosis of gyn cancer:  resolved but needs caution with potentially nephrotoxic interventions, thus no IV contrast with CT  4 hx of HTN  5.Flu vaccine given  6.difficult IV access: will definitely need PAC if further chemo is needed after surgery. She declined PICC for 2 treatments upcoming. 7.tingling left fingers only which I cannot tell is taxol related: even so, patient and family understand that they need to let me know prior to day 8 if symptoms worsen after day 1 this cycle; would hold day 8 if so.  We will recheck cbc, chemistries and ca125 at least when she is at [facility] for gyn onc visit early Jan. I expect to see her back when appropriate after surgery, or sooner if needed.  LIVESAY,LENNIS P, MD   06/14/2012, 4:20 PM

## 2012-06-15 ENCOUNTER — Other Ambulatory Visit (HOSPITAL_COMMUNITY): Payer: Self-pay | Admitting: Oncology

## 2012-06-15 ENCOUNTER — Telehealth: Payer: Self-pay | Admitting: *Deleted

## 2012-06-15 NOTE — Telephone Encounter (Signed)
Message copied by Phillis Knack on Wed Jun 15, 2012 10:26 AM ------      Message from: Reece Packer      Created: Tue Jun 14, 2012  8:53 PM       Labs seen and need follow up: please let her know CA 125 down to 35 (this had been >22,000 at diagnosis).

## 2012-06-15 NOTE — Telephone Encounter (Signed)
Left message with CA 125 results, to call us if she has questions

## 2012-06-16 ENCOUNTER — Ambulatory Visit: Payer: Medicare Other | Admitting: Nutrition

## 2012-06-16 ENCOUNTER — Other Ambulatory Visit (HOSPITAL_BASED_OUTPATIENT_CLINIC_OR_DEPARTMENT_OTHER): Payer: Medicare Other | Admitting: Lab

## 2012-06-16 ENCOUNTER — Other Ambulatory Visit: Payer: Self-pay | Admitting: Oncology

## 2012-06-16 ENCOUNTER — Ambulatory Visit (HOSPITAL_BASED_OUTPATIENT_CLINIC_OR_DEPARTMENT_OTHER): Payer: Medicare Other

## 2012-06-16 VITALS — BP 144/68 | HR 96 | Temp 98.2°F

## 2012-06-16 DIAGNOSIS — C569 Malignant neoplasm of unspecified ovary: Secondary | ICD-10-CM

## 2012-06-16 DIAGNOSIS — E119 Type 2 diabetes mellitus without complications: Secondary | ICD-10-CM

## 2012-06-16 DIAGNOSIS — Z5111 Encounter for antineoplastic chemotherapy: Secondary | ICD-10-CM

## 2012-06-16 LAB — WHOLE BLOOD GLUCOSE: Glucose: 371 mg/dL — ABNORMAL HIGH (ref 70–100)

## 2012-06-16 MED ORDER — ONDANSETRON 16 MG/50ML IVPB (CHCC)
16.0000 mg | Freq: Once | INTRAVENOUS | Status: AC
  Administered 2012-06-16: 16 mg via INTRAVENOUS

## 2012-06-16 MED ORDER — SODIUM CHLORIDE 0.9 % IV SOLN
300.0000 mg | Freq: Once | INTRAVENOUS | Status: AC
  Administered 2012-06-16: 300 mg via INTRAVENOUS
  Filled 2012-06-16: qty 30

## 2012-06-16 MED ORDER — INSULIN REGULAR HUMAN 100 UNIT/ML IJ SOLN
6.0000 [IU] | Freq: Once | INTRAMUSCULAR | Status: AC
  Administered 2012-06-16: 6 [IU] via SUBCUTANEOUS
  Filled 2012-06-16: qty 0.06

## 2012-06-16 MED ORDER — SODIUM CHLORIDE 0.9 % IV SOLN
Freq: Once | INTRAVENOUS | Status: AC
  Administered 2012-06-16: 11:00:00 via INTRAVENOUS

## 2012-06-16 MED ORDER — DIPHENHYDRAMINE HCL 50 MG/ML IJ SOLN
50.0000 mg | Freq: Once | INTRAMUSCULAR | Status: AC
  Administered 2012-06-16: 50 mg via INTRAVENOUS

## 2012-06-16 MED ORDER — FAMOTIDINE IN NACL 20-0.9 MG/50ML-% IV SOLN
20.0000 mg | Freq: Once | INTRAVENOUS | Status: AC
  Administered 2012-06-16: 20 mg via INTRAVENOUS

## 2012-06-16 MED ORDER — DEXAMETHASONE SODIUM PHOSPHATE 4 MG/ML IJ SOLN
20.0000 mg | Freq: Once | INTRAMUSCULAR | Status: AC
  Administered 2012-06-16: 20 mg via INTRAVENOUS

## 2012-06-16 MED ORDER — SODIUM CHLORIDE 0.9 % IV SOLN: 290.1000 mg | Freq: Once | INTRAVENOUS | Status: DC

## 2012-06-16 MED ORDER — PACLITAXEL CHEMO INJECTION 300 MG/50ML
80.0000 mg/m2 | Freq: Once | INTRAVENOUS | Status: AC
  Administered 2012-06-16: 138 mg via INTRAVENOUS
  Filled 2012-06-16: qty 23

## 2012-06-16 NOTE — Progress Notes (Signed)
Patient reports improved appetite. Her weight has increased to 157.7 pounds on December 3 from 152.3 pounds November 7. Patient continues to drink ensure occasionally but not as much as she used to. She reports she has been able to eat more. Her ascites has resolved. She has no nutrition questions or concerns today.  Nutrition diagnosis: Food and nutrition related knowledge deficit has improved.  Intervention: I have enforced strategies for increased oral intake focusing on lean proteins in small frequent meals to promote weight maintenance. Patient was educated that if oral intake remains adequate, she could discontinue Ensure Plus.  Monitoring, evaluation, goals: The patient has tolerated her oral diet and has had a 5 pound weight gain over the last month.  Next visit: Thursday, December 12, during chemotherapy.

## 2012-06-19 ENCOUNTER — Other Ambulatory Visit: Payer: Self-pay | Admitting: Oncology

## 2012-06-21 ENCOUNTER — Other Ambulatory Visit: Payer: Self-pay | Admitting: *Deleted

## 2012-06-21 DIAGNOSIS — C569 Malignant neoplasm of unspecified ovary: Secondary | ICD-10-CM

## 2012-06-21 MED ORDER — LORAZEPAM 0.5 MG PO TABS: ORAL_TABLET | ORAL | Status: DC

## 2012-06-22 ENCOUNTER — Other Ambulatory Visit: Payer: Self-pay | Admitting: *Deleted

## 2012-06-22 DIAGNOSIS — C569 Malignant neoplasm of unspecified ovary: Secondary | ICD-10-CM

## 2012-06-22 MED ORDER — HYDROCODONE-ACETAMINOPHEN 5-325 MG PO TABS: 1.0000 | ORAL_TABLET | Freq: Four times a day (QID) | ORAL | Status: DC | PRN

## 2012-06-23 ENCOUNTER — Other Ambulatory Visit (HOSPITAL_BASED_OUTPATIENT_CLINIC_OR_DEPARTMENT_OTHER): Payer: Medicare Other | Admitting: Lab

## 2012-06-23 ENCOUNTER — Ambulatory Visit (HOSPITAL_BASED_OUTPATIENT_CLINIC_OR_DEPARTMENT_OTHER): Payer: Medicare Other

## 2012-06-23 ENCOUNTER — Ambulatory Visit: Payer: Medicare Other | Admitting: Nutrition

## 2012-06-23 VITALS — BP 145/79 | HR 105 | Temp 97.6°F

## 2012-06-23 DIAGNOSIS — C569 Malignant neoplasm of unspecified ovary: Secondary | ICD-10-CM

## 2012-06-23 DIAGNOSIS — E119 Type 2 diabetes mellitus without complications: Secondary | ICD-10-CM

## 2012-06-23 DIAGNOSIS — Z5111 Encounter for antineoplastic chemotherapy: Secondary | ICD-10-CM

## 2012-06-23 LAB — CBC WITH DIFFERENTIAL/PLATELET
BASO%: 0.2 % (ref 0.0–2.0)
Basophils Absolute: 0 10*3/uL (ref 0.0–0.1)
Eosinophils Absolute: 0 10*3/uL (ref 0.0–0.5)
HCT: 36.6 % (ref 34.8–46.6)
LYMPH%: 7.5 % — ABNORMAL LOW (ref 14.0–49.7)
MCHC: 33.6 g/dL (ref 31.5–36.0)
MONO#: 0 10*3/uL — ABNORMAL LOW (ref 0.1–0.9)
NEUT%: 91.9 % — ABNORMAL HIGH (ref 38.4–76.8)
Platelets: 198 10*3/uL (ref 145–400)
WBC: 5.1 10*3/uL (ref 3.9–10.3)

## 2012-06-23 LAB — WHOLE BLOOD GLUCOSE
Glucose: 327 mg/dL — ABNORMAL HIGH (ref 70–100)
HRS PC: 0.75 Hours

## 2012-06-23 MED ORDER — ONDANSETRON 8 MG/50ML IVPB (CHCC)
8.0000 mg | Freq: Once | INTRAVENOUS | Status: AC
  Administered 2012-06-23: 8 mg via INTRAVENOUS

## 2012-06-23 MED ORDER — DEXAMETHASONE SODIUM PHOSPHATE 4 MG/ML IJ SOLN
20.0000 mg | Freq: Once | INTRAMUSCULAR | Status: AC
  Administered 2012-06-23: 20 mg via INTRAVENOUS

## 2012-06-23 MED ORDER — PACLITAXEL CHEMO INJECTION 300 MG/50ML
80.0000 mg/m2 | Freq: Once | INTRAVENOUS | Status: AC
  Administered 2012-06-23: 138 mg via INTRAVENOUS
  Filled 2012-06-23: qty 23

## 2012-06-23 MED ORDER — INSULIN REGULAR HUMAN 100 UNIT/ML IJ SOLN
6.0000 [IU] | Freq: Once | INTRAMUSCULAR | Status: AC
  Administered 2012-06-23: 6 [IU] via SUBCUTANEOUS
  Filled 2012-06-23: qty 0.06

## 2012-06-23 MED ORDER — INSULIN REGULAR HUMAN 100 UNIT/ML IJ SOLN
2.0000 [IU] | Freq: Once | INTRAMUSCULAR | Status: AC
  Administered 2012-06-23: 6 [IU] via SUBCUTANEOUS
  Filled 2012-06-23: qty 0.02

## 2012-06-23 MED ORDER — FAMOTIDINE IN NACL 20-0.9 MG/50ML-% IV SOLN
20.0000 mg | Freq: Once | INTRAVENOUS | Status: AC
  Administered 2012-06-23: 20 mg via INTRAVENOUS

## 2012-06-23 MED ORDER — SODIUM CHLORIDE 0.9 % IV SOLN
Freq: Once | INTRAVENOUS | Status: AC
  Administered 2012-06-23: 10:00:00 via INTRAVENOUS

## 2012-06-23 MED ORDER — DIPHENHYDRAMINE HCL 50 MG/ML IJ SOLN
50.0000 mg | Freq: Once | INTRAMUSCULAR | Status: AC
  Administered 2012-06-23: 50 mg via INTRAVENOUS

## 2012-06-23 NOTE — Patient Instructions (Addendum)
[facility] Discharge Instructions for Patients Receiving Chemotherapy  Today you received the following chemotherapy agents Taxol. To help prevent nausea and vomiting after your treatment, we encourage you to take your nausea medication  As per Dr. Darrold Span.  If you develop nausea and vomiting that is not controlled by your nausea medication, call the clinic. If it is after clinic hours your family physician or the after hours number for the clinic or go to the Emergency Department.   BELOW ARE SYMPTOMS THAT SHOULD BE REPORTED IMMEDIATELY:  *FEVER GREATER THAN 100.5 F  *CHILLS WITH OR WITHOUT FEVER  NAUSEA AND VOMITING THAT IS NOT CONTROLLED WITH YOUR NAUSEA MEDICATION  *UNUSUAL SHORTNESS OF BREATH  *UNUSUAL BRUISING OR BLEEDING  TENDERNESS IN MOUTH AND THROAT WITH OR WITHOUT PRESENCE OF ULCERS  *URINARY PROBLEMS  *BOWEL PROBLEMS  UNUSUAL RASH Items with * indicate a potential emergency and should be followed up as soon as possible.  One of the nurses will contact you 24 hours after your treatment. Please let the nurse know about any problems that you may have experienced. Feel free to call the clinic you have any questions or concerns. The clinic phone number is [phone].   I have been informed and understand all the instructions given to me. I know to contact the clinic, my physician, or go to the Emergency Department if any problems should occur. I do not have any questions at this time, but understand that I may call the clinic during office hours   should I have any questions or need assistance in obtaining follow up care.    __________________________________________  _____________  __________ Signature of Patient or Authorized Representative            Date                   Time    __________________________________________ Nurse's Signature   Check glucose by CBG within 2 hrs of arriving home

## 2012-06-23 NOTE — Progress Notes (Signed)
On arrival to chemo room  blood glucose 343 and per sliding scale order 6 units regular insulin given. On dc from chemo room blood glucose 327 and per sliding scale order 6 units regular insulin given.

## 2012-06-23 NOTE — Progress Notes (Signed)
Patient reports that she continues to eat well. There is not a new weight documented in the computer. She no longer consumes oral nutrition supplements because her appetite has increased and she feels like she is eating well. Noted elevated blood glucose today of 343. Patient reports that glucose is always elevated after taking steroids.  Nutrition diagnosis: Food and nutrition related knowledge deficit resolved.  I've encouraged patient to continue strategies for weight maintenance. I suggested she may want to consume oral nutrition supplements if her appetite decreases after chemotherapy. Patient agrees to contact me she has any questions or concerns. There is no followup scheduled at this time.

## 2012-07-14 ENCOUNTER — Other Ambulatory Visit: Payer: Self-pay

## 2012-07-14 DIAGNOSIS — C569 Malignant neoplasm of unspecified ovary: Secondary | ICD-10-CM

## 2012-07-14 MED ORDER — LORAZEPAM 0.5 MG PO TABS: ORAL_TABLET | ORAL | Status: DC

## 2012-07-19 ENCOUNTER — Ambulatory Visit: Payer: Medicare Other | Attending: Gynecologic Oncology | Admitting: Gynecologic Oncology

## 2012-07-19 ENCOUNTER — Other Ambulatory Visit: Payer: Self-pay | Admitting: *Deleted

## 2012-07-19 ENCOUNTER — Other Ambulatory Visit (HOSPITAL_BASED_OUTPATIENT_CLINIC_OR_DEPARTMENT_OTHER): Payer: Medicare Other | Admitting: Lab

## 2012-07-19 ENCOUNTER — Encounter: Payer: Self-pay | Admitting: Gynecologic Oncology

## 2012-07-19 VITALS — BP 128/80 | HR 78 | Temp 98.1°F | Resp 18 | Ht 66.0 in | Wt 160.3 lb

## 2012-07-19 DIAGNOSIS — E785 Hyperlipidemia, unspecified: Secondary | ICD-10-CM | POA: Insufficient documentation

## 2012-07-19 DIAGNOSIS — C569 Malignant neoplasm of unspecified ovary: Secondary | ICD-10-CM

## 2012-07-19 DIAGNOSIS — Z8543 Personal history of malignant neoplasm of ovary: Secondary | ICD-10-CM | POA: Insufficient documentation

## 2012-07-19 DIAGNOSIS — E119 Type 2 diabetes mellitus without complications: Secondary | ICD-10-CM

## 2012-07-19 DIAGNOSIS — I10 Essential (primary) hypertension: Secondary | ICD-10-CM | POA: Insufficient documentation

## 2012-07-19 DIAGNOSIS — C482 Malignant neoplasm of peritoneum, unspecified: Secondary | ICD-10-CM

## 2012-07-19 DIAGNOSIS — C786 Secondary malignant neoplasm of retroperitoneum and peritoneum: Secondary | ICD-10-CM | POA: Insufficient documentation

## 2012-07-19 LAB — CBC WITH DIFFERENTIAL/PLATELET
Basophils Absolute: 0 10*3/uL (ref 0.0–0.1)
Eosinophils Absolute: 0.1 10*3/uL (ref 0.0–0.5)
HCT: 35.9 % (ref 34.8–46.6)
HGB: 12.6 g/dL (ref 11.6–15.9)
MCV: 100.4 fL (ref 79.5–101.0)
MONO%: 9.6 % (ref 0.0–14.0)
NEUT#: 3.7 10*3/uL (ref 1.5–6.5)
RDW: 14.7 % — ABNORMAL HIGH (ref 11.2–14.5)

## 2012-07-19 LAB — COMPREHENSIVE METABOLIC PANEL (CC13)
Albumin: 3.3 g/dL — ABNORMAL LOW (ref 3.5–5.0)
BUN: 16 mg/dL (ref 7.0–26.0)
Calcium: 9.9 mg/dL (ref 8.4–10.4)
Chloride: 107 mEq/L (ref 98–107)
Glucose: 137 mg/dl — ABNORMAL HIGH (ref 70–99)
Potassium: 4.1 mEq/L (ref 3.5–5.1)

## 2012-07-19 MED ORDER — HYDROCODONE-ACETAMINOPHEN 5-325 MG PO TABS: 1.0000 | ORAL_TABLET | Freq: Four times a day (QID) | ORAL | Status: DC | PRN

## 2012-07-19 NOTE — Telephone Encounter (Signed)
Refill approved.

## 2012-07-19 NOTE — Patient Instructions (Addendum)
07/26/2012  She is scheduled for total abdominal hysterectomy bilateral salpingo-oophorectomy possible bowel resection omentectomy and debulking.

## 2012-07-19 NOTE — Progress Notes (Signed)
Office Visit Note: Gyn-Onc    Ann Baldwin 66 y.o. female  CC:  Chief Complaint  Patient presents with  . Peritoneal Carcinoma    Follow up    HPI: 66 y.o.  y/o G2P2 LNMP 40's  presented in April 2013 with abdominal discomfort, diarrhea, nausea, no constipation  that was thought to be IBD. The symptoms persisted and she was referred for colonoscopy 01/26/2012.  Findings were notable for  A large ulcer with heaped up margins in the ascending colon.  It was difficult to see and to obtain biopsies.    Pathology was c/w adenocarcinoma and the concern that it was not a primary colonic adenocarcinoma.    CT abdomen and pelvis 02/01/2012 with a 5x6.2 cmcomplex solid and cystic left adnexal mass, 3.9x2.9 right ovarian soft tissue mass, omental caking and peritoneal carcinomatosis.    The initial CA 125 returned > 22,000 CEA wnl, immuno stains of the biopsy were not c/w colon cancer.  Cr was > 8 and the patient was admitted with acute renal failure. Cytology from paracentesis was c/w Gyn primary. She has received 6 cycles of dose dense Taxol/carboplatin. Cycle 6was administered on 06/23/2012. CA 125 34.7 06/2012  A CT scan of the abdomen and pelvis collected on 05/12/2012 IMPRESSION:  1. Interval response to therapy. There has been near complete resolution of abdominal ascites and peritoneal nodularity. 2. Interval decrease in the size of bilateral adnexal cystic lesions. 3. Small soft tissue attenuating nodule arising from the uterine fundus is unchanged in size from previous exam and is favored to represent a subserosal (exophytic) fibroid. 4. No new or progressive disease identified. 5. Small subpleural nodule in the right middle lobe is stable from previous exam.    Past Surgical Hx:  Past Surgical History  Procedure Date  . No past surgeries     Past Medical Hx:  Past Medical History  Diagnosis Date  . Diabetes mellitus   . Hyperlipidemia   . Hypertension   . Ovarian cancer   Last  mammogram 2 years ago wnl.  Past Gynecological History: G2 P2 LNMP in 40's.  Menarche 12 regular menses until menopause.  No contraceptives used.  No h/o abnormal pap test, Last pap 10 years ago.  NSVD x 2.  No LMP recorded. Patient is postmenopausal.  Family Hx:  Family History  Problem Relation Age of Onset  . Colon cancer Neg Hx   . Esophageal cancer Neg Hx   . Stomach cancer Neg Hx   . Rectal cancer Neg Hx     Review of Systems:  Constitutional  Feels much better, increase energy Cardiovascular  No chest pain, shortness of breath,  Pulmonary  No cough or wheeze.  Gastro Intestinal  Denies nausea,  No bright red blood per rectum.  No constipation, no diarrhea Genito Urinary  No frequency, urgency, dysuria, no vaginal bleeding or discharge Musculo Skeletal  No myalgia, arthralgia, or pain  Neurologic  No weakness,persistent numbness of the left fingers.  No change in gait,  Psychology  No depression, anxiety,difficulty sleeping.   Vitals:  Blood pressure 128/80, pulse 78, temperature 98.1 F (36.7 C), temperature source Oral, resp. rate 18, height 5' 6" (1.676 m), weight 160 lb 4.8 oz (72.712 kg).  Physical Exam: Thin WD in NAD. Nervous with conversation Chest: Clear to auscultation Heart: Regular rate and rhythm normal S1-S2 Abdomen: Soft obese no palpable fluid wave no palpable omental cake no rebound or guarding Back: no CVA tenderness Lymph node: No   cervical supraclavicular or inguinal adenopathy Pelvic: Normal external genitalia Bartholin's urethra and Skene's glands. Vagina atrophic no lesions visible normal appearing cervix no cul-de-sac nodularity or parametrial nodularity, grade 1 enterocele Rectal: Good sphincter tone no palpable masses; Extremities: 1+ lower extremity edema bilaterally numerous bruises at sites of previous phlebotomy.   Assessment/Plan:  [name]  is a 66 y.o.  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 S/p resolution of multifactorial ARF.  After 6 cycles of chemotherapy her CA 125 is 06/2012 is 34.7. Imaging suggests a marked reduction in the volume of malignancy. On pelvic examination the pelvic masses are not assessable.   The options presented to the patient and her family today were that of interval debulking. On 07/26/2012  She is scheduled for total abdominal hysterectomy bilateral salpingo-oophorectomy possible bowel resection omentectomy and debulking.  The risks of the procedure d/w with patient are inclusive of infection bleeding damage to surrounding structures, fistula, prolonged hospitalization and reoperation.  The questions of the patient and her family were answered to their satisfaction.  The visit lasted for 30 minutes.  Greater than 15 minutes was spent in counseling regarding other options, continued chemotherapy versus observation and possible surgical permutations.     Oluwadarasimi Redmon, MD, PhD 07/19/2012, 11:51 AM  

## 2012-07-21 NOTE — Progress Notes (Signed)
Chest x-ray 02/11/12 on EPIC, EKG 02/11/12 on EPIC

## 2012-07-22 ENCOUNTER — Other Ambulatory Visit (HOSPITAL_COMMUNITY): Payer: Self-pay | Admitting: Gynecologic Oncology

## 2012-07-22 ENCOUNTER — Ambulatory Visit (HOSPITAL_COMMUNITY)
Admission: RE | Admit: 2012-07-22 | Discharge: 2012-07-22 | Disposition: A | Discharge status: Home / Self Care | Payer: Medicare Other | Source: Ambulatory Visit | Attending: Gynecologic Oncology | Admitting: Gynecologic Oncology

## 2012-07-22 ENCOUNTER — Encounter (HOSPITAL_COMMUNITY)
Admission: RE | Admit: 2012-07-22 | Discharge: 2012-07-22 | Disposition: A | Discharge status: Home / Self Care | Payer: Medicare Other | Source: Ambulatory Visit | Attending: Gynecologic Oncology | Admitting: Gynecologic Oncology

## 2012-07-22 ENCOUNTER — Encounter (HOSPITAL_COMMUNITY): Payer: Self-pay | Admitting: Pharmacy Technician

## 2012-07-22 ENCOUNTER — Other Ambulatory Visit: Payer: Self-pay

## 2012-07-22 ENCOUNTER — Encounter (HOSPITAL_COMMUNITY): Payer: Self-pay

## 2012-07-22 DIAGNOSIS — Z8543 Personal history of malignant neoplasm of ovary: Secondary | ICD-10-CM | POA: Insufficient documentation

## 2012-07-22 DIAGNOSIS — Z01810 Encounter for preprocedural cardiovascular examination: Secondary | ICD-10-CM | POA: Insufficient documentation

## 2012-07-22 DIAGNOSIS — C482 Malignant neoplasm of peritoneum, unspecified: Secondary | ICD-10-CM | POA: Insufficient documentation

## 2012-07-22 DIAGNOSIS — Z01812 Encounter for preprocedural laboratory examination: Secondary | ICD-10-CM | POA: Insufficient documentation

## 2012-07-22 HISTORY — DX: Anemia, unspecified: D64.9

## 2012-07-22 LAB — ABO/RH: ABO/RH(D): O POS

## 2012-07-22 LAB — COMPREHENSIVE METABOLIC PANEL
ALT: 22 U/L (ref 0–35)
AST: 18 U/L (ref 0–37)
Albumin: 3.6 g/dL (ref 3.5–5.2)
CO2: 27 mEq/L (ref 19–32)
Calcium: 9.5 mg/dL (ref 8.4–10.5)
GFR calc non Af Amer: 62 mL/min — ABNORMAL LOW (ref >90)
Sodium: 140 mEq/L (ref 135–145)

## 2012-07-22 LAB — CBC WITH DIFFERENTIAL/PLATELET
Basophils Absolute: 0 10*3/uL (ref 0.0–0.1)
Eosinophils Relative: 2 % (ref 0–5)
Lymphocytes Relative: 15 % (ref 12–46)
MCV: 99.4 fL (ref 78.0–100.0)
Neutro Abs: 3.3 10*3/uL (ref 1.7–7.7)
Platelets: 239 10*3/uL (ref 150–400)
RDW: 14 % (ref 11.5–15.5)
WBC: 4.4 10*3/uL (ref 4.0–10.5)

## 2012-07-22 LAB — SURGICAL PCR SCREEN: Staphylococcus aureus: POSITIVE — AB

## 2012-07-22 NOTE — Patient Instructions (Addendum)
20 Ann Baldwin  07/22/2012   Your procedure is scheduled on: 07-26-2012  Report to [facility] a 0515 AM.  Call this number if you have problems the morning of surgery [phone]   Remember:   Do not eat food :After Midnight Sunday night              Clear liquids all day Monday 07-25-2012, no liquids after midnight Monday night.      Take these medicines the morning of surgery with A SIP OF WATER: hydrocodone if needed   Do not wear jewelry, make-up or nail polish.  Do not wear lotions, powders, or perfumes. You may wear deodorant.   Men may shave face and neck.  Do not bring valuables to the hospital.  Contacts, dentures or bridgework may not be worn into surgery.  Leave suitcase in the car. After surgery it may be brought to your room.  For patients admitted to the hospital, checkout time is 11:00 AM the day of discharge.   Patients discharged the day of surgery will not be allowed to drive home.  Name and phone number of your driver:  Special Instructions: N/A   Please read over the following fact sheets that you were given: MRSA Information.. Blood fact sheet, incentive spirometer fact sheet  Call Cain Sieve RN pre op nurse if needed [phone]    FAILURE TO FOLLOW THESE INSTRUCTIONS MAY RESULT IN THE CANCELLATION OF YOUR SURGERY. PATIENT SIGNATURE___________________________________________

## 2012-07-25 ENCOUNTER — Other Ambulatory Visit (HOSPITAL_COMMUNITY): Payer: Self-pay | Admitting: Gynecologic Oncology

## 2012-07-26 ENCOUNTER — Encounter (HOSPITAL_COMMUNITY): Payer: Self-pay | Admitting: Anesthesiology

## 2012-07-26 ENCOUNTER — Inpatient Hospital Stay (HOSPITAL_COMMUNITY): Payer: Medicare Other | Admitting: Anesthesiology

## 2012-07-26 ENCOUNTER — Encounter (HOSPITAL_COMMUNITY): Payer: Self-pay

## 2012-07-26 ENCOUNTER — Inpatient Hospital Stay (HOSPITAL_COMMUNITY)
Admission: RE | Admit: 2012-07-26 | Discharge: 2012-07-31 | DRG: 737 | Disposition: A | Discharge status: Home / Self Care | Payer: Medicare Other | Source: Ambulatory Visit | Attending: Obstetrics & Gynecology | Admitting: Obstetrics & Gynecology

## 2012-07-26 ENCOUNTER — Encounter (HOSPITAL_COMMUNITY)
Admission: RE | Disposition: A | Discharge status: Home / Self Care | Payer: Self-pay | Source: Ambulatory Visit | Attending: Obstetrics & Gynecology

## 2012-07-26 DIAGNOSIS — E119 Type 2 diabetes mellitus without complications: Secondary | ICD-10-CM | POA: Diagnosis present

## 2012-07-26 DIAGNOSIS — E785 Hyperlipidemia, unspecified: Secondary | ICD-10-CM | POA: Diagnosis present

## 2012-07-26 DIAGNOSIS — C786 Secondary malignant neoplasm of retroperitoneum and peritoneum: Secondary | ICD-10-CM | POA: Diagnosis present

## 2012-07-26 DIAGNOSIS — N838 Other noninflammatory disorders of ovary, fallopian tube and broad ligament: Secondary | ICD-10-CM | POA: Diagnosis present

## 2012-07-26 DIAGNOSIS — R000 Tachycardia, unspecified: Secondary | ICD-10-CM | POA: Diagnosis present

## 2012-07-26 DIAGNOSIS — C569 Malignant neoplasm of unspecified ovary: Secondary | ICD-10-CM

## 2012-07-26 DIAGNOSIS — Z9221 Personal history of antineoplastic chemotherapy: Secondary | ICD-10-CM

## 2012-07-26 DIAGNOSIS — D62 Acute posthemorrhagic anemia: Secondary | ICD-10-CM | POA: Diagnosis not present

## 2012-07-26 DIAGNOSIS — I10 Essential (primary) hypertension: Secondary | ICD-10-CM | POA: Diagnosis present

## 2012-07-26 DIAGNOSIS — C785 Secondary malignant neoplasm of large intestine and rectum: Secondary | ICD-10-CM | POA: Diagnosis present

## 2012-07-26 HISTORY — PX: COLOSTOMY REVISION: SHX5232

## 2012-07-26 HISTORY — PX: SALPINGOOPHORECTOMY: SHX82

## 2012-07-26 HISTORY — PX: ABDOMINAL HYSTERECTOMY: SHX81

## 2012-07-26 HISTORY — PX: LAPAROTOMY: SHX154

## 2012-07-26 HISTORY — PX: OMENTECTOMY: SHX5985

## 2012-07-26 LAB — GLUCOSE, CAPILLARY
Glucose-Capillary: 139 mg/dL — ABNORMAL HIGH (ref 70–99)
Glucose-Capillary: 174 mg/dL — ABNORMAL HIGH (ref 70–99)
Glucose-Capillary: 208 mg/dL — ABNORMAL HIGH (ref 70–99)
Glucose-Capillary: 248 mg/dL — ABNORMAL HIGH (ref 70–99)
Glucose-Capillary: 283 mg/dL — ABNORMAL HIGH (ref 70–99)

## 2012-07-26 LAB — TYPE AND SCREEN

## 2012-07-26 SURGERY — LAPAROTOMY, EXPLORATORY
Anesthesia: General | Wound class: Clean

## 2012-07-26 MED ORDER — DIPHENHYDRAMINE HCL 50 MG/ML IJ SOLN: 12.5000 mg | Freq: Four times a day (QID) | INTRAMUSCULAR | Status: DC | PRN

## 2012-07-26 MED ORDER — MIDAZOLAM HCL 5 MG/5ML IJ SOLN
INTRAMUSCULAR | Status: DC | PRN
  Administered 2012-07-26: 2 mg via INTRAVENOUS

## 2012-07-26 MED ORDER — ONDANSETRON HCL 4 MG PO TABS: 4.0000 mg | ORAL_TABLET | Freq: Four times a day (QID) | ORAL | Status: DC | PRN

## 2012-07-26 MED ORDER — SUCCINYLCHOLINE CHLORIDE 20 MG/ML IJ SOLN
INTRAMUSCULAR | Status: DC | PRN
  Administered 2012-07-26: 100 mg via INTRAVENOUS

## 2012-07-26 MED ORDER — NEOSTIGMINE METHYLSULFATE 1 MG/ML IJ SOLN
INTRAMUSCULAR | Status: DC | PRN
  Administered 2012-07-26: 4 mg via INTRAVENOUS

## 2012-07-26 MED ORDER — ESMOLOL HCL 10 MG/ML IV SOLN
INTRAVENOUS | Status: DC | PRN
  Administered 2012-07-26: 20 mg via INTRAVENOUS

## 2012-07-26 MED ORDER — ZOLPIDEM TARTRATE 5 MG PO TABS
5.0000 mg | ORAL_TABLET | Freq: Every evening | ORAL | Status: DC | PRN
  Administered 2012-07-30: 5 mg via ORAL
  Filled 2012-07-26: qty 1

## 2012-07-26 MED ORDER — PROMETHAZINE HCL 25 MG/ML IJ SOLN: 6.2500 mg | INTRAMUSCULAR | Status: DC | PRN

## 2012-07-26 MED ORDER — INSULIN ASPART 100 UNIT/ML ~~LOC~~ SOLN
0.0000 [IU] | SUBCUTANEOUS | Status: DC
  Administered 2012-07-26: 8 [IU] via SUBCUTANEOUS
  Administered 2012-07-26: 5 [IU] via SUBCUTANEOUS
  Administered 2012-07-26: 8 [IU] via SUBCUTANEOUS
  Administered 2012-07-27 (×3): 3 [IU] via SUBCUTANEOUS
  Administered 2012-07-27: 5 [IU] via SUBCUTANEOUS
  Administered 2012-07-27 (×2): 3 [IU] via SUBCUTANEOUS
  Administered 2012-07-28: 2 [IU] via SUBCUTANEOUS
  Administered 2012-07-28 (×2): 3 [IU] via SUBCUTANEOUS
  Administered 2012-07-28 – 2012-07-29 (×3): 2 [IU] via SUBCUTANEOUS

## 2012-07-26 MED ORDER — LACTATED RINGERS IV SOLN
INTRAVENOUS | Status: DC | PRN
  Administered 2012-07-26 (×2): via INTRAVENOUS

## 2012-07-26 MED ORDER — BUPIVACAINE LIPOSOME 1.3 % IJ SUSP
20.0000 mL | Freq: Once | INTRAMUSCULAR | Status: AC
  Administered 2012-07-26: 20 mL
  Filled 2012-07-26: qty 20

## 2012-07-26 MED ORDER — DIPHENHYDRAMINE HCL 12.5 MG/5ML PO ELIX: 12.5000 mg | ORAL_SOLUTION | Freq: Four times a day (QID) | ORAL | Status: DC | PRN

## 2012-07-26 MED ORDER — INSULIN GLARGINE 100 UNIT/ML ~~LOC~~ SOLN
10.0000 [IU] | Freq: Every day | SUBCUTANEOUS | Status: DC
  Administered 2012-07-26 – 2012-07-30 (×5): 10 [IU] via SUBCUTANEOUS

## 2012-07-26 MED ORDER — KETOROLAC TROMETHAMINE 30 MG/ML IJ SOLN
15.0000 mg | Freq: Four times a day (QID) | INTRAMUSCULAR | Status: AC
  Administered 2012-07-26 – 2012-07-27 (×3): 15 mg via INTRAVENOUS
  Filled 2012-07-26 (×3): qty 1

## 2012-07-26 MED ORDER — LACTATED RINGERS IV SOLN
INTRAVENOUS | Status: DC
  Administered 2012-07-26: 11:00:00 via INTRAVENOUS

## 2012-07-26 MED ORDER — OXYCODONE-ACETAMINOPHEN 5-325 MG PO TABS
1.0000 | ORAL_TABLET | ORAL | Status: DC | PRN
  Administered 2012-07-29 – 2012-07-31 (×9): 1 via ORAL
  Filled 2012-07-26 (×10): qty 1

## 2012-07-26 MED ORDER — NALOXONE HCL 0.4 MG/ML IJ SOLN: 0.4000 mg | INTRAMUSCULAR | Status: DC | PRN

## 2012-07-26 MED ORDER — FENTANYL CITRATE 0.05 MG/ML IJ SOLN
INTRAMUSCULAR | Status: DC | PRN
  Administered 2012-07-26: 75 ug via INTRAVENOUS
  Administered 2012-07-26 (×2): 50 ug via INTRAVENOUS
  Administered 2012-07-26: 100 ug via INTRAVENOUS
  Administered 2012-07-26 (×3): 50 ug via INTRAVENOUS
  Administered 2012-07-26: 75 ug via INTRAVENOUS

## 2012-07-26 MED ORDER — SODIUM CHLORIDE 0.9 % IV SOLN
1.0000 g | INTRAVENOUS | Status: DC | PRN
  Administered 2012-07-26: 1 g via INTRAVENOUS

## 2012-07-26 MED ORDER — ACETAMINOPHEN 10 MG/ML IV SOLN
INTRAVENOUS | Status: DC | PRN
  Administered 2012-07-26: 1000 mg via INTRAVENOUS

## 2012-07-26 MED ORDER — ONDANSETRON HCL 4 MG/2ML IJ SOLN
INTRAMUSCULAR | Status: DC | PRN
  Administered 2012-07-26: 4 mg via INTRAVENOUS

## 2012-07-26 MED ORDER — CISATRACURIUM BESYLATE (PF) 10 MG/5ML IV SOLN
INTRAVENOUS | Status: DC | PRN
  Administered 2012-07-26: 2 mg via INTRAVENOUS
  Administered 2012-07-26 (×2): 4 mg via INTRAVENOUS
  Administered 2012-07-26: 3 mg via INTRAVENOUS

## 2012-07-26 MED ORDER — ONDANSETRON HCL 4 MG/2ML IJ SOLN
4.0000 mg | Freq: Four times a day (QID) | INTRAMUSCULAR | Status: DC | PRN
  Administered 2012-07-26 – 2012-07-28 (×4): 4 mg via INTRAVENOUS
  Filled 2012-07-26 (×4): qty 2

## 2012-07-26 MED ORDER — HYDROMORPHONE 0.3 MG/ML IV SOLN
INTRAVENOUS | Status: DC
  Administered 2012-07-26: 11:00:00 via INTRAVENOUS
  Administered 2012-07-27: 0.4 mg via INTRAVENOUS
  Administered 2012-07-27: 0.2 mg via INTRAVENOUS
  Administered 2012-07-27: 0.599 mg via INTRAVENOUS
  Administered 2012-07-28: 0.6 mg via INTRAVENOUS
  Administered 2012-07-28: 0.39 mg via INTRAVENOUS
  Administered 2012-07-28: 0.399 mg via INTRAVENOUS
  Administered 2012-07-28: 0.999 mg via INTRAVENOUS
  Administered 2012-07-29: 0.58 mg via INTRAVENOUS
  Administered 2012-07-29: 0.2 mg via INTRAVENOUS
  Administered 2012-07-29: 1.19 mg via INTRAVENOUS
  Filled 2012-07-26: qty 25

## 2012-07-26 MED ORDER — SODIUM CHLORIDE 0.9 % IJ SOLN: 9.0000 mL | INTRAMUSCULAR | Status: DC | PRN

## 2012-07-26 MED ORDER — METOCLOPRAMIDE HCL 5 MG/ML IJ SOLN
INTRAMUSCULAR | Status: DC | PRN
  Administered 2012-07-26: 10 mg via INTRAVENOUS

## 2012-07-26 MED ORDER — HETASTARCH-ELECTROLYTES 6 % IV SOLN
INTRAVENOUS | Status: DC | PRN
  Administered 2012-07-26: 09:00:00 via INTRAVENOUS

## 2012-07-26 MED ORDER — HYDROMORPHONE HCL PF 1 MG/ML IJ SOLN
0.2500 mg | INTRAMUSCULAR | Status: DC | PRN
  Administered 2012-07-26 (×2): 0.5 mg via INTRAVENOUS

## 2012-07-26 MED ORDER — GLYCOPYRROLATE 0.2 MG/ML IJ SOLN
INTRAMUSCULAR | Status: DC | PRN
  Administered 2012-07-26: .6 mg via INTRAVENOUS

## 2012-07-26 MED ORDER — CEFAZOLIN SODIUM-DEXTROSE 2-3 GM-% IV SOLR
2.0000 g | INTRAVENOUS | Status: AC
  Administered 2012-07-26: 2 g via INTRAVENOUS

## 2012-07-26 MED ORDER — PROPOFOL 10 MG/ML IV BOLUS
INTRAVENOUS | Status: DC | PRN
  Administered 2012-07-26: 150 mg via INTRAVENOUS

## 2012-07-26 MED ORDER — 0.9 % SODIUM CHLORIDE (POUR BTL) OPTIME
TOPICAL | Status: DC | PRN
  Administered 2012-07-26: 3000 mL

## 2012-07-26 MED ORDER — KETOROLAC TROMETHAMINE 30 MG/ML IJ SOLN: 15.0000 mg | Freq: Four times a day (QID) | INTRAMUSCULAR | Status: AC

## 2012-07-26 MED ORDER — ENOXAPARIN SODIUM 40 MG/0.4ML ~~LOC~~ SOLN
40.0000 mg | SUBCUTANEOUS | Status: AC
  Administered 2012-07-26: 40 mg via SUBCUTANEOUS
  Filled 2012-07-26: qty 0.4

## 2012-07-26 MED ORDER — MEPERIDINE HCL 50 MG/ML IJ SOLN: 6.2500 mg | INTRAMUSCULAR | Status: DC | PRN

## 2012-07-26 MED ORDER — LIDOCAINE HCL (CARDIAC) 20 MG/ML IV SOLN
INTRAVENOUS | Status: DC | PRN
  Administered 2012-07-26: 50 mg via INTRAVENOUS

## 2012-07-26 MED ORDER — KCL IN DEXTROSE-NACL 20-5-0.45 MEQ/L-%-% IV SOLN
INTRAVENOUS | Status: DC
  Administered 2012-07-26 (×2): via INTRAVENOUS
  Administered 2012-07-27: 125 mL via INTRAVENOUS
  Administered 2012-07-27: 125 mL/h via INTRAVENOUS
  Administered 2012-07-27 – 2012-07-28 (×3): via INTRAVENOUS
  Administered 2012-07-28: 125 mL via INTRAVENOUS
  Administered 2012-07-28: 125 mL/h via INTRAVENOUS
  Filled 2012-07-26 (×10): qty 1000

## 2012-07-26 MED ORDER — STERILE WATER FOR IRRIGATION IR SOLN
Status: DC | PRN
  Administered 2012-07-26: 1500 mL

## 2012-07-26 MED ORDER — DEXAMETHASONE SODIUM PHOSPHATE 10 MG/ML IJ SOLN
INTRAMUSCULAR | Status: DC | PRN
  Administered 2012-07-26: 10 mg via INTRAVENOUS

## 2012-07-26 MED ORDER — HYDROMORPHONE HCL PF 1 MG/ML IJ SOLN
INTRAMUSCULAR | Status: DC | PRN
  Administered 2012-07-26 (×4): 0.5 mg via INTRAVENOUS

## 2012-07-26 SURGICAL SUPPLY — 51 items
ATTRACTOMAT 16X20 MAGNETIC DRP (DRAPES) ×4 IMPLANT
BAG URINE DRAINAGE (UROLOGICAL SUPPLIES) IMPLANT
CANISTER SUCTION 2500CC (MISCELLANEOUS) ×4 IMPLANT
CLIP TI MEDIUM LARGE 6 (CLIP) IMPLANT
CLOTH BEACON ORANGE TIMEOUT ST (SAFETY) ×4 IMPLANT
CONT SPECI 4OZ STER CLIK (MISCELLANEOUS) IMPLANT
COVER SURGICAL LIGHT HANDLE (MISCELLANEOUS) ×4 IMPLANT
DRAPE UTILITY 15X26 (DRAPE) ×4 IMPLANT
DRAPE WARM FLUID 44X44 (DRAPE) ×4 IMPLANT
ELECT BLADE 6.5 EXT (BLADE) ×4 IMPLANT
ELECT REM PT RETURN 9FT ADLT (ELECTROSURGICAL) ×4
ELECTRODE REM PT RTRN 9FT ADLT (ELECTROSURGICAL) ×3 IMPLANT
GAUZE SPONGE 4X4 16PLY XRAY LF (GAUZE/BANDAGES/DRESSINGS) ×4 IMPLANT
GLOVE BIO SURGEON STRL SZ 6.5 (GLOVE) ×8 IMPLANT
GLOVE BIO SURGEON STRL SZ7.5 (GLOVE) ×8 IMPLANT
GLOVE BIOGEL M STRL SZ7.5 (GLOVE) ×8 IMPLANT
GLOVE BIOGEL PI IND STRL 8.5 (GLOVE) ×6 IMPLANT
GLOVE BIOGEL PI INDICATOR 8.5 (GLOVE) ×2
GOWN STRL REIN XL XLG (GOWN DISPOSABLE) ×4 IMPLANT
KIT BASIN OR (CUSTOM PROCEDURE TRAY) ×4 IMPLANT
LIGASURE IMPACT 36 18CM CVD LR (INSTRUMENTS) ×4 IMPLANT
NS IRRIG 1000ML POUR BTL (IV SOLUTION) ×4 IMPLANT
PACK ABDOMINAL WL (CUSTOM PROCEDURE TRAY) ×4 IMPLANT
RELOAD PROXIMATE 75MM BLUE (ENDOMECHANICALS) ×8 IMPLANT
SHEET LAVH (DRAPES) ×4 IMPLANT
SPONGE LAP 18X18 X RAY DECT (DISPOSABLE) ×8 IMPLANT
STAPLER 90 3.5 STAND SLIM (STAPLE) ×4
STAPLER 90 3.5 STD SLIM (STAPLE) ×3 IMPLANT
STAPLER GUN LINEAR PROX 60 (STAPLE) ×4 IMPLANT
STAPLER PROXIMATE 75MM BLUE (STAPLE) ×4 IMPLANT
STAPLER VISISTAT 35W (STAPLE) ×4 IMPLANT
SUCTION POOLE TIP (SUCTIONS) ×4 IMPLANT
SUT ETHILON 1 LR 30 (SUTURE) IMPLANT
SUT PDS AB 0 CT1 36 (SUTURE) ×4 IMPLANT
SUT PDS AB 0 CTX 60 (SUTURE) ×8 IMPLANT
SUT SILK 2 0 (SUTURE) ×1
SUT SILK 2 0 30  PSL (SUTURE)
SUT SILK 2 0 30 PSL (SUTURE) IMPLANT
SUT SILK 2-0 18XBRD TIE 12 (SUTURE) ×3 IMPLANT
SUT SILK 3 0 SH CR/8 (SUTURE) ×4 IMPLANT
SUT VIC AB 0 CT1 36 (SUTURE) ×12 IMPLANT
SUT VIC AB 2-0 SH 27 (SUTURE) ×2
SUT VIC AB 2-0 SH 27X BRD (SUTURE) ×6 IMPLANT
SUT VIC AB 3-0 CTX 36 (SUTURE) IMPLANT
SUT VIC AB 4-0 PS1 27 (SUTURE) ×4 IMPLANT
SUT VICRYL 0 TIES 12 18 (SUTURE) ×4 IMPLANT
TOWEL BLUE STERILE X RAY DET (MISCELLANEOUS) ×4 IMPLANT
TOWEL OR 17X26 10 PK STRL BLUE (TOWEL DISPOSABLE) ×4 IMPLANT
TOWEL OR NON WOVEN STRL DISP B (DISPOSABLE) ×4 IMPLANT
TRAY FOLEY CATH 14FRSI W/METER (CATHETERS) ×4 IMPLANT
WATER STERILE IRR 1500ML POUR (IV SOLUTION) ×4 IMPLANT

## 2012-07-26 NOTE — Interval H&P Note (Signed)
History and Physical Interval Note:  07/26/2012 7:04 AM  Ann Baldwin  has presented today for surgery, with the diagnosis of Peritoneal Cancer  The various methods of treatment have been discussed with the patient and family. After consideration of risks, benefits and other options for treatment, the patient has consented to  Procedure(s) (LRB) with comments: EXPLORATORY LAPAROTOMY (N/A) - Exploratory Laporatomy/Total Abdominal Hysterectomy Bilatderal Salpingo Oopherectomy/Tumor Debulking as a surgical intervention .  The patient's history has been reviewed, patient examined, no change in status, stable for surgery.  I have reviewed the patient's chart and labs.  Questions were answered to the patient's satisfaction.     [place], [facility]

## 2012-07-26 NOTE — H&P (View-Only) (Signed)
Office Visit Note: Gyn-Onc    Ann Baldwin 67 y.o. female  CC:  Chief Complaint  Patient presents with  . Peritoneal Carcinoma    Follow up    HPI: 67 y.o.  y/o G2P2 LNMP 40's  presented in April 2013 with abdominal discomfort, diarrhea, nausea, no constipation  that was thought to be IBD. The symptoms persisted and she was referred for colonoscopy 01/26/2012.  Findings were notable for  A large ulcer with heaped up margins in the ascending colon.  It was difficult to see and to obtain biopsies.    Pathology was c/w adenocarcinoma and the concern that it was not a primary colonic adenocarcinoma.    CT abdomen and pelvis 02/01/2012 with a 5x6.2 cmcomplex solid and cystic left adnexal mass, 3.9x2.9 right ovarian soft tissue mass, omental caking and peritoneal carcinomatosis.    The initial CA 125 returned > 22,000 CEA wnl, immuno stains of the biopsy were not c/w colon cancer.  Cr was > 8 and the patient was admitted with acute renal failure. Cytology from paracentesis was c/w Gyn primary. She has received 6 cycles of dose dense Taxol/carboplatin. Cycle 6was administered on 06/23/2012. CA 125 34.7 06/2012  A CT scan of the abdomen and pelvis collected on 05/12/2012 IMPRESSION:  1. Interval response to therapy. There has been near complete resolution of abdominal ascites and peritoneal nodularity. 2. Interval decrease in the size of bilateral adnexal cystic lesions. 3. Small soft tissue attenuating nodule arising from the uterine fundus is unchanged in size from previous exam and is favored to represent a subserosal (exophytic) fibroid. 4. No new or progressive disease identified. 5. Small subpleural nodule in the right middle lobe is stable from previous exam.    Past Surgical Hx:  Past Surgical History  Procedure Date  . No past surgeries     Past Medical Hx:  Past Medical History  Diagnosis Date  . Diabetes mellitus   . Hyperlipidemia   . Hypertension   . Ovarian cancer   Last  mammogram 2 years ago wnl.  Past Gynecological History: G2 P2 LNMP in 40's.  Menarche 12 regular menses until menopause.  No contraceptives used.  No h/o abnormal pap test, Last pap 10 years ago.  NSVD x 2.  No LMP recorded. Patient is postmenopausal.  Family Hx:  Family History  Problem Relation Age of Onset  . Colon cancer Neg Hx   . Esophageal cancer Neg Hx   . Stomach cancer Neg Hx   . Rectal cancer Neg Hx     Review of Systems:  Constitutional  Feels much better, increase energy Cardiovascular  No chest pain, shortness of breath,  Pulmonary  No cough or wheeze.  Gastro Intestinal  Denies nausea,  No bright red blood per rectum.  No constipation, no diarrhea Genito Urinary  No frequency, urgency, dysuria, no vaginal bleeding or discharge Musculo Skeletal  No myalgia, arthralgia, or pain  Neurologic  No weakness,persistent numbness of the left fingers.  No change in gait,  Psychology  No depression, anxiety,difficulty sleeping.   Vitals:  Blood pressure 128/80, pulse 78, temperature 98.1 F (36.7 C), temperature source Oral, resp. rate 18, height 5\' 6"  (1.676 m), weight 160 lb 4.8 oz (72.712 kg).  Physical Exam: Thin WD in NAD. Nervous with conversation Chest: Clear to auscultation Heart: Regular rate and rhythm normal S1-S2 Abdomen: Soft obese no palpable fluid wave no palpable omental cake no rebound or guarding Back: no CVA tenderness Lymph node: No  cervical supraclavicular or inguinal adenopathy Pelvic: Normal external genitalia Bartholin's urethra and Skene's glands. Vagina atrophic no lesions visible normal appearing cervix no cul-de-sac nodularity or parametrial nodularity, grade 1 enterocele Rectal: Good sphincter tone no palpable masses; Extremities: 1+ lower extremity edema bilaterally numerous bruises at sites of previous phlebotomy.   Assessment/Plan:  [name]  is a 67 y.o.  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 S/p resolution of multifactorial ARF.  After 6 cycles of chemotherapy her CA 125 is 06/2012 is 34.7. Imaging suggests a marked reduction in the volume of malignancy. On pelvic examination the pelvic masses are not assessable.   The options presented to the patient and her family today were that of interval debulking. On 07/26/2012  She is scheduled for total abdominal hysterectomy bilateral salpingo-oophorectomy possible bowel resection omentectomy and debulking.  The risks of the procedure d/w with patient are inclusive of infection bleeding damage to surrounding structures, fistula, prolonged hospitalization and reoperation.  The questions of the patient and her family were answered to their satisfaction.  The visit lasted for 30 minutes.  Greater than 15 minutes was spent in counseling regarding other options, continued chemotherapy versus observation and possible surgical permutations.     Laurette Schimke, MD, PhD 07/19/2012, 11:51 AM

## 2012-07-26 NOTE — Anesthesia Preprocedure Evaluation (Addendum)
Anesthesia Evaluation  Patient identified by MRN, date of birth, ID band Patient awake    Reviewed: Allergy & Precautions, H&P , NPO status , Patient's Chart, lab work & pertinent test results  Airway Mallampati: II TM Distance: >3 FB Neck ROM: Full    Dental No notable dental hx.    Pulmonary neg pulmonary ROS,  breath sounds clear to auscultation  Pulmonary exam normal       Cardiovascular hypertension, negative cardio ROS  Rhythm:Regular Rate:Normal     Neuro/Psych negative neurological ROS  negative psych ROS   GI/Hepatic negative GI ROS, Neg liver ROS,   Endo/Other  negative endocrine ROSdiabetes  Renal/GU negative Renal ROS  negative genitourinary   Musculoskeletal negative musculoskeletal ROS (+)   Abdominal   Peds negative pediatric ROS (+)  Hematology negative hematology ROS (+)   Anesthesia Other Findings   Reproductive/Obstetrics negative OB ROS                          Anesthesia Physical Anesthesia Plan  ASA: II  Anesthesia Plan: General   Post-op Pain Management:    Induction: Intravenous  Airway Management Planned: Oral ETT  Additional Equipment:   Intra-op Plan:   Post-operative Plan: Extubation in OR  Informed Consent: I have reviewed the patients History and Physical, chart, labs and discussed the procedure including the risks, benefits and alternatives for the proposed anesthesia with the patient or authorized representative who has indicated his/her understanding and acceptance.   Dental advisory given  Plan Discussed with: CRNA  Anesthesia Plan Comments:         Anesthesia Quick Evaluation

## 2012-07-26 NOTE — Op Note (Signed)
Preoperative Diagnosis: 1. Peritoneal adenocarcinoma with ascending colon invasion  Postoperative Diagnosis: Same  Procedure(s) Performed:  Exploratory laparotomy with total hysterectomy bilateral salpingo-oophorectomy, infragastric omentectomy, partial resection of the ascending colon with ileotransverse loop  enterocolostomy   Surgeon: Maryclare Labrador.  Nelly Rout, MD., PhD  Assistant Surgeon: Antionette Char, M.D. Assistant: Telford Nab RN  Specimens: Uterus Cervix, Bilateral tubes / ovaries, omentum mid 6cm segment of the ascending colon  Estimated Blood Loss: 200 mL. Blood Replacement: None  Complications: None.   Operative Findings: A mobile 4 cm left  ovarian mass,  miliary disease on the anterior uterine serosa, no evidence of omental metastatic disease. Tumor palpable within the midportion of the ascending colon.  At the completion of the procedure the patient was debulked to no gross residual disease  Procedure:   The patient was seen in the Holding Room. The risks, benefits, complications, treatment options, and expected outcomes were discussed with the patient.  The patient concurred with the proposed plan, giving informed consent.   The patient was  identified as Ann Baldwin   and the procedure verified as hysterectomy, BSO with possible debulking.  A Time Out was held and the above information confirmed upon entry to the operating room..  After induction of anesthesia, the patient was draped and prepped in the usual sterile manner.  She was prepped and draped in the normal sterile fashion in the dorsal lithotomy position in padded Allen stirrups with good attention paid to support of the lower back and lower extremities. Position was adjusted for appropriate support. A Foley catheter was placed to gravity.   A midline vertical incision was made and carried through the subcutaneous tissue to the fascia. The fascial incision was made and extended superiorally approximately 5 cm  above the umbilicus. The rectus muscles were separated. The peritoneum was identified and entered. Peritoneal incision was extended longitudinally.  The abdominal cavity was entered sharply and without incident. A Bookwalter retractor was then placed. A survey of the abdomen and pelvis revealed the above findings.   After packing the small bowel into the upper abdomen, Kelly clamps were placed on both uterine cornua. The round ligaments were identified and transected with the bovie. The left retroperitoneal space was entered. The pararectal space was developed and the retroperitoneum developed up to the level of the common iliac artery.  The course of the ureter was identified with ease. The left IP was then skeletonized, clamped, and ligated with the LigaSure. The uterine vessels were skeletonized.  The anterior peritoneal reflection was incised and the bladder was dissected off the lower uterine segment. The retroperitoneal space was explored and the ureters were identified bilaterally. The right adnexa were secured.  The right ureter was identified in the retroperitoneum.  The right IP was secured and transected with the LigaSure.  The uterine vessels were skeletonized  , then clamped and ligated with the LigaSure.. Serial pedicles of the cardinal and utero-sacral ligaments were transected with the LigaSure. Entrance was made into the vagina and the cervix amputated.  The vaginal cuff was then closed in the standard [facility] fashion with 0 PDS suture.   The pelvis was copiously irrigated. Hemostasis was observed.   An infragastric omentectomy was performed using a Ligasure.  Hemostasis was assured.  Attention was then placed in the ascending colon.The midportion of the ascending colon was transected with use of the GIA stapler.  The bowel was separated from the mesentery with the use of the LigaSure  The distal ileum  was approximated to the transverse colon with interrupted silk sutures. Bowel clamps were  placed proximal and distal on the transverse colon to the area where the distal ileum, (approximately 15 cm from the ileocecal junction) was to be approximated to the transverse colon .  Incisions were made in the antimesenteric aspect of the transverse colon and ileum and the GIA stapler used to create the new lumen.  The bowel defect was closed with a 90 millimeter TA stapler created lumen was appropriate.  Interrupted silk sutures were placed just proximal to the anastomosis for stability of the suture line.  The area was copiously irrigated and drained and hemostasis was assured.  The gloves of the entire surgical team and instruments were replaced.  The fascia was reapproximated with 0 looped PDS using a total of two sutures using mass closure. The subcutaneous layer was then irrigated copiously.   The subcutaneous tissue was infiltrated with 20 cc of Inspral.  The skin was closed with staples.   The patient tolerated the procedure well.   Sponge, lap and needle counts were correct x 2.    The patient had sequential compression devices and for VTE prophylaxis and continued to receive this prophylaxis postoperatively. The patient was extubated and taken to the recovery room in stable condition.

## 2012-07-26 NOTE — Anesthesia Postprocedure Evaluation (Signed)
  Anesthesia Post-op Note  Patient: Ann Baldwin  Procedure(s) Performed: Procedure(s) (LRB): EXPLORATORY LAPAROTOMY (N/A) HYSTERECTOMY ABDOMINAL () SALPINGO OOPHORECTOMY (Bilateral) OMENTECTOMY () COLON RESECTION RIGHT ()  Patient Location: PACU  Anesthesia Type: General  Level of Consciousness: awake and alert   Airway and Oxygen Therapy: Patient Spontanous Breathing  Post-op Pain: mild  Post-op Assessment: Post-op Vital signs reviewed, Patient's Cardiovascular Status Stable, Respiratory Function Stable, Patent Airway and No signs of Nausea or vomiting  Last Vitals:  Filed Vitals:   07/26/12 1100  BP: 144/58  Pulse: 77  Temp: 36.4 C  Resp: 9    Post-op Vital Signs: stable   Complications: No apparent anesthesia complications

## 2012-07-26 NOTE — Transfer of Care (Signed)
Immediate Anesthesia Transfer of Care Note  Patient: Ann Baldwin  Procedure(s) Performed: Procedure(s) (LRB) with comments: EXPLORATORY LAPAROTOMY (N/A) - Exploratory Laporatomy/Total Abdominal Hysterectomy Bilatderal Salpingo Oopherectomy/Tumor Debulking HYSTERECTOMY ABDOMINAL () SALPINGO OOPHORECTOMY (Bilateral) OMENTECTOMY () COLON RESECTION RIGHT () - with reanastomsis  Patient Location: PACU  Anesthesia Type:General  Level of Consciousness: awake, alert  and oriented  Airway & Oxygen Therapy: Patient Spontanous Breathing and Patient connected to face mask oxygen  Post-op Assessment: Report given to PACU RN, Post -op Vital signs reviewed and stable and Patient moving all extremities  Post vital signs: Reviewed and stable  Complications: No apparent anesthesia complications

## 2012-07-27 ENCOUNTER — Encounter (HOSPITAL_COMMUNITY): Payer: Self-pay | Admitting: Gynecologic Oncology

## 2012-07-27 LAB — BASIC METABOLIC PANEL
BUN: 16 mg/dL (ref 6–23)
Calcium: 8.2 mg/dL — ABNORMAL LOW (ref 8.4–10.5)
GFR calc Af Amer: 83 mL/min — ABNORMAL LOW (ref >90)
GFR calc non Af Amer: 72 mL/min — ABNORMAL LOW (ref >90)
Potassium: 4.7 mEq/L (ref 3.5–5.1)
Sodium: 134 mEq/L — ABNORMAL LOW (ref 135–145)

## 2012-07-27 LAB — CBC
Hemoglobin: 7.6 g/dL — ABNORMAL LOW (ref 12.0–15.0)
MCHC: 34.4 g/dL (ref 30.0–36.0)
Platelets: 203 10*3/uL (ref 150–400)

## 2012-07-27 LAB — GLUCOSE, CAPILLARY
Glucose-Capillary: 189 mg/dL — ABNORMAL HIGH (ref 70–99)
Glucose-Capillary: 200 mg/dL — ABNORMAL HIGH (ref 70–99)

## 2012-07-27 MED ORDER — BIOTENE DRY MOUTH MT LIQD: 15.0000 mL | Freq: Two times a day (BID) | OROMUCOSAL | Status: DC

## 2012-07-27 MED ORDER — ACETAMINOPHEN 325 MG PO TABS
325.0000 mg | ORAL_TABLET | Freq: Four times a day (QID) | ORAL | Status: DC | PRN
  Administered 2012-07-27 – 2012-07-29 (×2): 650 mg via ORAL
  Filled 2012-07-27 (×2): qty 2

## 2012-07-27 MED ORDER — CHLORHEXIDINE GLUCONATE 0.12 % MT SOLN
15.0000 mL | Freq: Two times a day (BID) | OROMUCOSAL | Status: DC
  Administered 2012-07-27 – 2012-07-30 (×5): 15 mL via OROMUCOSAL
  Filled 2012-07-27 (×10): qty 15

## 2012-07-27 NOTE — Progress Notes (Signed)
1 Day Post-Op Procedure(s) (LRB): EXPLORATORY LAPAROTOMY (N/A) HYSTERECTOMY ABDOMINAL () SALPINGO OOPHORECTOMY (Bilateral) OMENTECTOMY () COLON RESECTION RIGHT ()  Subjective: Patient reports nausea last pm.  Reporting adequate pain relief.  She has not been out of bed since surgery.  Denies chest pain, dyspnea, dizziness, nausea, vomiting, passing flatus, or having a bowel movement.  Patient wanting to hold off on receiving a blood transfusion at this time and we will re-evaluate this afternoon after she increases her mobility.  Objective: Vital signs in last 24 hours: Temp:  [97.3 F (36.3 C)-99.1 F (37.3 C)] 98.1 F (36.7 C) (01/15 0550) Pulse Rate:  [77-105] 89  (01/15 0550) Resp:  [7-18] 17  (01/15 0757) BP: (93-161)/(58-84) 114/77 mmHg (01/15 0550) SpO2:  [95 %-100 %] 98 % (01/15 0757) Weight:  [159 lb 9.6 oz (72.394 kg)] 159 lb 9.6 oz (72.394 kg) (01/14 1136) Last BM Date: 07/26/12  Intake/Output from previous day: 01/14 0701 - 01/15 0700 In: 4891.7 [I.V.:4391.7; IV Piggyback:500] Out: 1080 [Urine:830; Blood:250]  Physical Examination: General: alert, cooperative and no distress Resp: clear to auscultation bilaterally Cardio: regular rate and rhythm, S1, S2 normal, no murmur, click, rub or gallop GI: abnormal findings:  hypoactive bowel sounds and abdomen soft, non-distended and incision: incision midline with staples clean, dry, and intact Extremities: extremities normal, atraumatic, no cyanosis or edema  Labs: WBC/Hgb/Hct/Plts:  6.8/7.6/22.1/203 (01/15 0423) BUN/Cr/glu/ALT/AST/amyl/lip:  16/0.82/--/--/--/--/-- (01/15 0423)  Assessment: 67 y.o. s/p Procedure(s): EXPLORATORY LAPAROTOMY HYSTERECTOMY ABDOMINAL SALPINGO OOPHORECTOMY OMENTECTOMY COLON RESECTION RIGHT: stable Pain:  Pain is well-controlled on PCA.  Heme:  Hgb 7.6 and Hct 22.1 this am with EBL from surgery of 200 cc.  CV: History of hypertension.  Stable at this time.  GI:  Tolerating po: No: NPO.      Endo: Diabetes: Under fair control.  On sliding scale Q4H and Lantus 10 units at bedtime.  CBG (last 3)   Basename 07/27/12 0757 07/27/12 0355 07/26/12 2352  GLUCAP 189* 190* 208*    Prophylaxis: intermittent pneumatic compression boots.  Plan: Sips of clear liquids Hold off on blood transfusion at this time per patient CBC and Bmet in the am Assess for symptoms of anemia as patient increases her mobility Hold on lovenox at this time.  Re-evaluate in the am. Encourage ambulation Encourage IS use, deep breathing, and coughing Continue post-operative plan of care   LOS: 1 day    Helaina Stefano DEAL 07/27/2012, 9:01 AM

## 2012-07-27 NOTE — Care Management Note (Signed)
    Page 1 of 1   07/27/2012     10:53:30 AM   CARE MANAGEMENT NOTE 07/27/2012  Patient:  Ann Baldwin, Ann Baldwin   Account Number:  [id number]  Date Initiated:  07/27/2012  Documentation initiated by:  Lorenda Ishihara  Subjective/Objective Assessment:   67 yo female admitted s/p expl lap with TAH, oopherectomy, colon resection. PTA lived at home with family.     Action/Plan:   Home when stable   Anticipated DC Date:  07/30/2012   Anticipated DC Plan:  HOME/SELF CARE      DC Planning Services  CM consult      Choice offered to / List presented to:             Status of service:  Completed, signed off Medicare Important Message given?   (If response is "NO", the following Medicare IM given date fields will be blank) Date Medicare IM given:   Date Additional Medicare IM given:    Discharge Disposition:  HOME/SELF CARE  Per UR Regulation:  Reviewed for med. necessity/level of care/duration of stay  If discussed at Long Length of Stay Meetings, dates discussed:    Comments:

## 2012-07-27 NOTE — Progress Notes (Signed)
Pt foley d/c'd early a.m.  Pt was only able to void very small amount of urine.  NP notified and order for bladder scan given and carried out.  Bladder scan was 185.  New order to insert foley catheter given and carried out.   Foley only drained approximately 200cc's of yellow urine.  NP is aware.

## 2012-07-27 NOTE — Progress Notes (Signed)
Inpatient Diabetes Program Recommendations  AACE/ADA: New Consensus Statement on Inpatient Glycemic Control (2013)  Target Ranges:  Prepandial:   less than 140 mg/dL      Peak postprandial:   less than 180 mg/dL (1-2 hours)      Critically ill patients:  140 - 180 mg/dL   Reason for Visit: Hyperglycemia  67 yo with diagnosis of peritoneal CA is POD1 for explor lap. Hx DM2.  Results for Ann Baldwin, Ann Baldwin (MRN [id number]) as of 07/27/2012 12:39  Ref. Range 07/26/2012 20:01 07/26/2012 23:52 07/27/2012 03:55 07/27/2012 07:57 07/27/2012 11:38  Glucose-Capillary Latest Range: 70-99 mg/dL 782 (H) 956 (H) 213 (H) 189 (H) 200 (H)    Inpatient Diabetes Program Recommendations Correction (SSI): Continue Q4 while NPO, then tidwc and hs Insulin - Meal Coverage: When pt eating, add Novolog 2 units tidwc if >50% meal is consumed Diet: When diet advanced, CHO mod med  Note: Will follow.  Thank you. Ailene Ards, RD, LDN, CDE Inpatient Diabetes Coordinator [phone]

## 2012-07-27 NOTE — Progress Notes (Signed)
1 Day Post-Op Procedure(s) (LRB): EXPLORATORY LAPAROTOMY (N/A) HYSTERECTOMY ABDOMINAL () SALPINGO OOPHORECTOMY (Bilateral) OMENTECTOMY () COLON RESECTION RIGHT ()  Subjective: Patient reports shortness of breath and nausea when getting out of bed today for the first time.  Tolerating sitting in the chair right now.  Nausea resolved after zofran administration.  Tolerating sips of clear liquids.  Headache improved after Tylenol administration earlier.  Denies passing flatus, having a bowel movement, dyspnea at this time, chest pain, or dizziness.  Blood administration discussed with patient and family.  Patient and family stating that they would like to proceed with a blood transfusion after risks and benefits discussed.  Objective: Vital signs in last 24 hours: Temp:  [97.4 F (36.3 C)-99.1 F (37.3 C)] 98.2 F (36.8 C) (01/15 1400) Pulse Rate:  [87-105] 88  (01/15 1400) Resp:  [10-17] 17  (01/15 1400) BP: (93-126)/(49-77) 110/49 mmHg (01/15 1400) SpO2:  [95 %-99 %] 98 % (01/15 1400) Last BM Date: 07/26/12  Intake/Output from previous day: 01/14 0701 - 01/15 0700 In: 4891.7 [I.V.:4391.7; IV Piggyback:500] Out: 1080 [Urine:830; Blood:250]  Physical Examination: General: alert, cooperative and no distress Resp: mildly diminished in the bases Cardio: regular rate and rhythm, S1, S2 normal, no murmur, click, rub or gallop GI: abnormal findings:  hypoactive bowel sounds and obese and incision: incision clean, dry, and intact.  Abdomen soft, non-distended Extremities: extremities normal, atraumatic, no cyanosis or edema  Labs: WBC/Hgb/Hct/Plts:  6.8/7.6/22.1/203 (01/15 0423) BUN/Cr/glu/ALT/AST/amyl/lip:  16/0.82/--/--/--/--/-- (01/15 0423)  Assessment: 67 y.o. s/p Procedure(s): EXPLORATORY LAPAROTOMY HYSTERECTOMY ABDOMINAL SALPINGO OOPHORECTOMY OMENTECTOMY COLON RESECTION RIGHT: stable Pain:  Pain is well-controlled on PCA.  Heme:  Hgb 7.2 and Hct 22.1 this am.  Patient  symptomatic when ambulating.  Risks and benefits of a blood transfusion discussed with patient and family who wish to proceed with the transfusion.  CV: History of hypertension: stable at this time  GI:  Tolerating po: Yes- sips of clears  GU:  DTV since foley removal     Endo: Diabetes: on sliding scale and lantus.  CBG (last 3)   Basename 07/27/12 1138 07/27/12 0757 07/27/12 0355  GLUCAP 200* 189* 190*   Prophylaxis: intermittent pneumatic compression boots.  Plan: Transfuse to units PRBCs per Dr. Tamela Oddi Labs in the am Continue with sips of clear liquids Encourage ambulation and IS use Bladder scan now Monitor output Continue post-operative plan of care   LOS: 1 day    CROSS, MELISSA DEAL 07/27/2012, 3:41 PM

## 2012-07-28 LAB — BASIC METABOLIC PANEL
BUN: 9 mg/dL (ref 6–23)
Calcium: 8.7 mg/dL (ref 8.4–10.5)
Creatinine, Ser: 0.7 mg/dL (ref 0.50–1.10)
GFR calc non Af Amer: 87 mL/min — ABNORMAL LOW (ref >90)
Glucose, Bld: 122 mg/dL — ABNORMAL HIGH (ref 70–99)

## 2012-07-28 LAB — GLUCOSE, CAPILLARY
Glucose-Capillary: 115 mg/dL — ABNORMAL HIGH (ref 70–99)
Glucose-Capillary: 126 mg/dL — ABNORMAL HIGH (ref 70–99)
Glucose-Capillary: 145 mg/dL — ABNORMAL HIGH (ref 70–99)

## 2012-07-28 LAB — CBC
HCT: 28.2 % — ABNORMAL LOW (ref 36.0–46.0)
Hemoglobin: 9.5 g/dL — ABNORMAL LOW (ref 12.0–15.0)
MCH: 31.6 pg (ref 26.0–34.0)
MCHC: 33.7 g/dL (ref 30.0–36.0)

## 2012-07-28 MED ORDER — ENOXAPARIN SODIUM 40 MG/0.4ML ~~LOC~~ SOLN
40.0000 mg | Freq: Every day | SUBCUTANEOUS | Status: DC
  Administered 2012-07-28 – 2012-07-31 (×4): 40 mg via SUBCUTANEOUS
  Filled 2012-07-28 (×4): qty 0.4

## 2012-07-28 NOTE — Progress Notes (Signed)
2 Days Post-Op Procedure(s) (LRB): EXPLORATORY LAPAROTOMY (N/A) HYSTERECTOMY ABDOMINAL () SALPINGO OOPHORECTOMY (Bilateral) OMENTECTOMY () COLON RESECTION RIGHT ()  Subjective: Patient reports nausea this am.  Tolerated sips of clears yesterday afternoon with the development of nausea this am.  Reporting adequate pain relief with PCA use.  Denies emesis, passing flatus, having a bowel movement, chest pain, or dyspnea.  No complaints voiced when receiving blood transfusion yesterday.  Asking "when can I go home?"  Objective: Vital signs in last 24 hours: Temp:  [97.4 F (36.3 C)-99.3 F (37.4 C)] 98.7 F (37.1 C) (01/16 0620) Pulse Rate:  [87-98] 93  (01/16 0620) Resp:  [10-23] 17  (01/16 0620) BP: (110-143)/(48-68) 141/68 mmHg (01/16 0620) SpO2:  [94 %-99 %] 97 % (01/16 0620) Last BM Date: 07/26/12  Intake/Output from previous day: 01/15 0701 - 01/16 0700 In: 1976.3 [P.O.:120; I.V.:1131.3; Blood:725] Out: 1700 [Urine:1700]  Physical Examination: General: alert, cooperative, no distress and flat affect which is normal for the patient Resp: mildly diminished in the bases, no crackles or rhonchi Cardio: regular rate and rhythm, S1, S2 normal, no murmur, click, rub or gallop GI: soft, non-tender; bowel sounds normal; no masses,  no organomegaly and incision: midline incision with staples clean, dry, and intact Extremities: extremities normal, atraumatic, no cyanosis or edema IV site to the right lateral wrist appears infiltrated.  Saline locked IV site to the right A/C WNL and dated 07/27/12  Labs: WBC/Hgb/Hct/Plts:  5.6/9.5/28.2/167 (01/16 0750) BUN/Cr/glu/ALT/AST/amyl/lip:  9/0.70/--/--/--/--/-- (01/16 0750)  Assessment: 67 y.o. s/p Procedure(s): EXPLORATORY LAPAROTOMY HYSTERECTOMY ABDOMINAL SALPINGO OOPHORECTOMY OMENTECTOMY COLON RESECTION RIGHT: stable Pain:  Pain is well-controlled on PCA.  Heme:  Hgb 9.5 and Hct 28.2 post transfusion of 2 units PRBCs on  07/27/12.  CV:  History of hypertension: stable at this time.  Mild tachycardia improving post transfusion: continue to monitor  GI:  Nausea this am.  Had been tolerating sips of clears yesterday afternoon.    GU:  Urine output improving with 1700 cc out from 07/27/12 to 07/28/12.  Endo: Diabetes: on sliding scale and lantus.  CBG values improving.  CBG (last 3)   Basename 07/28/12 0810 07/28/12 0354 07/28/12 0006  GLUCAP 110* 160* 155*    Prophylaxis: intermittent pneumatic compression boots.  Plan: Continue sips of clears until nausea resolves Begin Lovenox SQ daily Discontinue foley and monitor output Encourage ambulation, IS use, deep breathing, and coughing If nausea resolves, potentially advance diet and saline lock this afternoon Continue post-operative plan of care   LOS: 2 days    Marvelous Bouwens DEAL 07/28/2012, 9:40 AM

## 2012-07-29 LAB — TYPE AND SCREEN
ABO/RH(D): O POS
Antibody Screen: NEGATIVE
Unit division: 0

## 2012-07-29 LAB — CBC
Hemoglobin: 9.6 g/dL — ABNORMAL LOW (ref 12.0–15.0)
MCHC: 32.8 g/dL (ref 30.0–36.0)
RDW: 17.6 % — ABNORMAL HIGH (ref 11.5–15.5)
WBC: 5.4 10*3/uL (ref 4.0–10.5)

## 2012-07-29 LAB — GLUCOSE, CAPILLARY
Glucose-Capillary: 99 mg/dL (ref 70–99)
Glucose-Capillary: 111 mg/dL — ABNORMAL HIGH (ref 70–99)

## 2012-07-29 MED ORDER — INSULIN ASPART 100 UNIT/ML ~~LOC~~ SOLN: 0.0000 [IU] | Freq: Three times a day (TID) | SUBCUTANEOUS | Status: DC

## 2012-07-29 NOTE — Clinical Documentation Improvement (Signed)
Anemia Blood Loss Clarification  THIS DOCUMENT IS NOT A PERMANENT PART OF THE MEDICAL RECORD  RESPOND TO THE THIS QUERY, FOLLOW THE INSTRUCTIONS BELOW:  1. If needed, update documentation for the patient's encounter via the notes activity.  2. Access this query again and click edit on the In [employer].  3. After updating, or not, click F2 to complete all highlighted (required) fields concerning your review. Select "additional documentation in the medical record" OR "no additional documentation provided".  4. Click Sign note button.  5. The deficiency will fall out of your In Basket *Please let us know if you are not able to complete this workflow by phone or e-mail (listed below).        07/29/12  Dear Lenn Cal, L/Associates  In an effort to better capture your patient's severity of illness, reflect appropriate length of stay and utilization of resources, a review of the patient medical record has revealed the following indicators.    Based on your clinical judgment, please clarify and document in a progress note and/or discharge summary the clinical condition associated with the following supporting information:  In responding to this query please exercise your independent judgment.  The fact that a query is asked, does not imply that any particular answer is desired or expected.  In this pt admitted with Peritoneal ca a review of the medical record reveals the following::   Abnormal H/H=9.6/28.2  2 Units of PRBCs   EBL=  Clarification Needed   Please clarify the underlying diagnosis responsible for the abnormal lab value and document in the pn and d/c summary.  Thank you for all that you do for our patients!   Possible Clinical Conditions?   " Expected Acute Blood Loss Anemia  " Acute Blood Loss Anemia  " Acute on chronic blood loss anemia  " Other Condition________________  " Cannot Clinically Determine  Risk Factors: (recent surgery, pre op  anemia, EBL in OR)  Supporting Information:  Signs and Symptoms  Peritoneal Cancer , ascites, Ulcerative lesion to ascending colon, recent ARF, Exploratory laparotomy with total hysterectomy bilateral salpingo-oophorectomy, infragastric omentectomy, partial resection of the ascending colon with ileotransverse loop  enterocolostomy   Diagnostics: Component     Latest Ref Rng 07/28/2012         7:50 AM  Hemoglobin     12.0 - 15.0 g/dL 9.5 (L)  HCT     45.4 - 46.0 % 28.2 (L)   Component     Latest Ref Rng 07/29/2012         4:31 AM  Hemoglobin     12.0 - 15.0 g/dL 9.6 (L)  HCT     09.8 - 46.0 % 29.3 (L)   Treatments:  Listed above  Reviewed:  no additional documentation provided; please see documentation in progress note.  Intraoperative hemorrhage/anemia secondary to acute blood loss documented  Thank You,  Enis Slipper RN, BSN, MSN/Inf, CCDS Clinical Documentation Specialist [name] Dept Pager: [phone] / E-mail: [email or web address] Management [email or web address]

## 2012-07-29 NOTE — Progress Notes (Signed)
3 Days Post-Op Procedure(s) (LRB): EXPLORATORY LAPAROTOMY (N/A) HYSTERECTOMY ABDOMINAL () SALPINGO OOPHORECTOMY (Bilateral) OMENTECTOMY () COLON RESECTION RIGHT ()  Subjective: Patient reports "feeling pretty good."  Nausea has resolved.  Tolerating sips of clear liquids.  Ambulating without difficulty.  Voiding adequate amounts.  Minimal incisional pain reported.  Denies chest pain, dyspnea, emesis, passing flatus, or having a bowel movement.   Objective: Vital signs in last 24 hours: Temp:  [97.9 F (36.6 C)-98.6 F (37 C)] 98 F (36.7 C) (01/17 0622) Pulse Rate:  [80-89] 87  (01/17 0622) Resp:  [12-21] 12  (01/17 0748) BP: (125-149)/(61-80) 149/69 mmHg (01/17 0622) SpO2:  [94 %-99 %] 97 % (01/17 0748) FiO2 (%):  [34 %] 34 % (01/17 0622) Last BM Date: 07/26/12  Intake/Output from previous day: 01/16 0701 - 01/17 0700 In: 4593.3 [P.O.:60; I.V.:4533.3] Out: 4200 [Urine:4200]  Physical Examination: General: alert, cooperative and no distress Resp: clear to auscultation bilaterally Cardio: regular rate and rhythm, S1, S2 normal, no murmur, click, rub or gallop GI: soft, non-tender; bowel sounds normal; no masses,  no organomegaly and incision: midline incision with staples clean, dry, and intact Extremities: extremities normal, atraumatic, no cyanosis or edema  Labs: WBC/Hgb/Hct/Plts:  5.4/9.6/29.3/179 (01/17 0431)    Assessment: 67 y.o. s/p Procedure(s): EXPLORATORY LAPAROTOMY HYSTERECTOMY ABDOMINAL SALPINGO OOPHORECTOMY OMENTECTOMY COLON RESECTION RIGHT: stable Pain:  Pain is well-controlled on PCA.  Heme:  Hgb 9.6 and Hct 29.3 this am.  Stable at this time.  CV:  History of hypertension: stable at this time.  GI:  Tolerating po: Yes     Endo:  Diabetes: under good control at this time.  Sliding scale ordered with lantus at bedtime.   CBG (last 3)   Basename 07/29/12 0743 07/29/12 0404 07/29/12 0002  GLUCAP 148* 85 145*   Prophylaxis: intermittent pneumatic  compression boots and Lovenox 40mg  SQ daily.  Plan: Advance diet Encourage ambulation Advance to PO medication Discontinue IV fluids CBGs changed to AC/HS Encourage IS use, deep breathing, and coughing Continue post-operative plan of care   LOS: 3 days    CROSS, MELISSA DEAL 07/29/2012, 9:16 AM

## 2012-07-30 LAB — GLUCOSE, CAPILLARY
Glucose-Capillary: 95 mg/dL (ref 70–99)
Glucose-Capillary: 105 mg/dL — ABNORMAL HIGH (ref 70–99)

## 2012-07-30 NOTE — Progress Notes (Signed)
Patient ID: Ann Baldwin, female   DOB: 08-07-1945, 66 y.o.   MRN: [id number] 4 Days Post-Op Procedure(s) (LRB): EXPLORATORY LAPAROTOMY (N/A) HYSTERECTOMY ABDOMINAL () SALPINGO OOPHORECTOMY (Bilateral) OMENTECTOMY () COLON RESECTION RIGHT ()  Subjective: Patient denies N/V.  No flatus or BM   Objective: Vital signs in last 24 hours: Temp:  [98.1 F (36.7 C)-98.3 F (36.8 C)] 98.1 F (36.7 C) (01/18 0510) Pulse Rate:  [83-100] 100  (01/18 0510) Resp:  [14-16] 16  (01/18 0510) BP: (131-170)/(80-83) 131/80 mmHg (01/18 0510) SpO2:  [90 %-94 %] 90 % (01/18 0510) Last BM Date: 07/26/12  Intake/Output from previous day: 01/17 0701 - 01/18 0700 In: 980 [P.O.:480; I.V.:500] Out: 1800 [Urine:1800]  Physical Examination: General: alert, cooperative and no distress Resp: clear to auscultation bilaterally Cardio: regular rate and rhythm, S1, S2 normal, no murmur, click, rub or gallop GI: soft, non-tender; bowel sounds normal; no masses,  no organomegaly and incision: midline incision with staples clean, dry, and intact Extremities: extremities normal, atraumatic, no cyanosis or edema   Assessment: 67 y.o. s/p Procedure(s): EXPLORATORY LAPAROTOMY HYSTERECTOMY ABDOMINAL SALPINGO OOPHORECTOMY OMENTECTOMY COLON RESECTION RIGHT: stable Pain:  Pain is well-controlled on oral meds  CV:  History of hypertension: stable at this time.  GI:  Tolerating po: Yes     Endo:  Diabetes: under good control at this time.  Sliding scale ordered with lantus at bedtime.   CBG (last 3)   Basename 07/30/12 0742 07/29/12 2212 07/29/12 1620  GLUCAP 95 105* 111*   Prophylaxis: intermittent pneumatic compression boots and Lovenox 40mg  SQ daily.  Plan: Encourage ambulation Await return of bowel function Encourage IS use, deep breathing, and coughing Continue post-operative plan of care   LOS: 4 days    JACKSON-MOORE,Tajuan Dufault A 07/30/2012, 9:59 AM

## 2012-07-31 DIAGNOSIS — D62 Acute posthemorrhagic anemia: Secondary | ICD-10-CM | POA: Diagnosis not present

## 2012-07-31 LAB — GLUCOSE, CAPILLARY: Glucose-Capillary: 115 mg/dL — ABNORMAL HIGH (ref 70–99)

## 2012-07-31 MED ORDER — OXYCODONE-ACETAMINOPHEN 5-325 MG PO TABS
1.0000 | ORAL_TABLET | Freq: Four times a day (QID) | ORAL | Status: DC | PRN
Start: 2012-07-31 — End: 2012-08-24

## 2012-07-31 MED ORDER — ENOXAPARIN (LOVENOX) PATIENT EDUCATION KIT
PACK | Freq: Once | Status: DC
  Filled 2012-07-31: qty 1

## 2012-07-31 MED ORDER — ENOXAPARIN SODIUM 40 MG/0.4ML ~~LOC~~ SOLN: 40.0000 mg | Freq: Every day | SUBCUTANEOUS | Status: DC

## 2012-07-31 NOTE — Discharge Summary (Signed)
Physician Discharge Summary  Patient ID: Ann Baldwin MRN: [id number] DOB/AGE: Jan 06, 1946 67 y.o.  Admit date: 07/26/2012 Discharge date: 07/31/2012  Admission Diagnoses: Ovarian mass Discharge Diagnoses:  Active Problems:  Ovarian mass  Postoperative anemia due to acute blood loss   Discharged Condition: good  Hospital Course: On 07/26/2012, the patient underwent the following: Procedure(s): EXPLORATORY LAPAROTOMY HYSTERECTOMY ABDOMINAL SALPINGO OOPHORECTOMY OMENTECTOMY COLON RESECTION RIGHT.   On POD#1, there was a downward trend in the hemoglobin felt to be consistent with the intraoperative blood loss.  She became symptomatic from the anemia and was transfused 2 units PRBC.  She also had urinary retention and the foley catheter was replaced for 24 hours.  Subsequently she was voiding without difficulty.There was gradual return of her bowel function and she was discharged to home on postoperative day 5 tolerating a regular diet.  Consults: None  Significant Diagnostic Studies: None  Treatments: surgery: see above  Discharge Exam: Blood pressure 125/76, pulse 107, temperature 98.8 F (37.1 C), temperature source Oral, resp. rate 16, height 5\' 3"  (1.6 m), weight 72.394 kg (159 lb 9.6 oz), SpO2 91.00%. General appearance: alert GI: soft, non-tender; bowel sounds normal; no masses,  no organomegaly Extremities: extremities normal, atraumatic, no cyanosis or edema Incision/Wound:  C/D/I  Disposition: 01-Home or Self Care      Discharge Orders    Future Orders Please Complete By Expires   Diet - low sodium heart healthy      Diet Carb Modified      Activity as tolerated - No restrictions      Increase activity slowly      May walk up steps      May shower / Bathe      Comments:   No tub baths for 6 weeks   Driving Restrictions      Comments:   No driving for 2 weeks   Sexual Activity Restrictions      Comments:   No intercourse for 6 - 8 weeks   Discharge  wound care:      Comments:   Keep clean and dry   Call MD for:  temperature >100.4      Call MD for:  persistant nausea and vomiting      Call MD for:  severe uncontrolled pain      Call MD for:  redness, tenderness, or signs of infection (pain, swelling, redness, odor or green/yellow discharge around incision site)      Call MD for:  persistant dizziness or light-headedness      Call MD for:  extreme fatigue      Lifting restrictions      Comments:   No lifting > 15 - 20 lbs for 6 weeks       Medication List     As of 07/31/2012 11:27 AM    STOP taking these medications         HYDROcodone-acetaminophen 5-325 MG per tablet   Commonly known as: NORCO/VICODIN      TAKE these medications         aspirin 81 MG tablet   Take 81 mg by mouth every morning.      atorvastatin 40 MG tablet   Commonly known as: LIPITOR   Take 40 mg by mouth at bedtime.      dexamethasone 4 MG tablet   Commonly known as: DECADRON   Take 4 mg by mouth 2 (two) times daily with a meal. 5 tablets (20 mg) PO with food  12 hours prior to taxol chemo.      docusate sodium 100 MG capsule   Commonly known as: COLACE   Take 100 mg by mouth at bedtime.      enoxaparin 40 MG/0.4ML injection   Commonly known as: LOVENOX   Inject 0.4 mLs (40 mg total) into the skin daily.      ezetimibe 10 MG tablet   Commonly known as: ZETIA   Take 10 mg by mouth every morning.      insulin regular 100 units/mL injection   Commonly known as: NOVOLIN R,HUMULIN R   Inject 2-6 Units into the skin 2 (two) times daily before a meal. Sliding scale:  BS 0-199 NO INSULIN  BS 200-250   2 UNITS   BS > 250-300  4 UNITS  BS >300  6  UNITS      iron polysaccharides 150 MG capsule   Commonly known as: NIFEREX   Take 150 mg by mouth every evening.      LORazepam 0.5 MG tablet   Commonly known as: ATIVAN   Place 0.5 mg under the tongue daily at 10 pm. nausea. Will make drowsy      ondansetron 8 MG tablet   Commonly known  as: ZOFRAN   Take 8 mg by mouth every 8 (eight) hours as needed. Nausea      oxyCODONE-acetaminophen 5-325 MG per tablet   Commonly known as: PERCOCET/ROXICET   Take 1-2 tablets by mouth every 6 (six) hours as needed (moderate to severe pain (when tolerating fluids)).      polyethylene glycol packet   Commonly known as: MIRALAX / GLYCOLAX   Take 17 g by mouth 2 (two) times daily as needed. constipation        Follow-up Information    Call to follow up. [phone])          SignedAntionette Char A 07/31/2012, 11:27 AM

## 2012-07-31 NOTE — Progress Notes (Signed)
Gave pt Lovenox teaching kit.  Instructed pt and family on how she is to give herself a Lovenox shot.  Pt and family stated that they understood, and had  relatives that lived behind pt that gave hrt  insulin shots when her blood sugar is above 200.

## 2012-08-03 ENCOUNTER — Telehealth: Payer: Self-pay | Admitting: Gynecologic Oncology

## 2012-08-03 NOTE — Telephone Encounter (Signed)
Called to check on the patient's current status and to assess for improvement in constipation after laxative use.  The patient was asleep with the second call and the grand-daughter was instructed to let us know if Ann Baldwin needed anything.

## 2012-08-07 ENCOUNTER — Telehealth: Payer: Self-pay | Admitting: Hematology & Oncology

## 2012-08-07 NOTE — Telephone Encounter (Signed)
Dgtr- in-law called stating that her mom was doing better, but having some abd pain.  No further n/v.  Is eating.  Some loose stool.  I suggested that she try Pepcid.    I also recommended that if symptoms worsened, that she could call the surgeon who did her de=bulking surgery as the symptoms appear to be a surgical issue and not from any chemo as she has not had any recently and surgery was 10d ago.  Pete E.

## 2012-08-09 ENCOUNTER — Telehealth: Payer: Self-pay | Admitting: Gynecologic Oncology

## 2012-08-09 NOTE — Telephone Encounter (Signed)
Patient and family called the office reporting that the patient is having loose stools with incontinent episodes at times.  She also reports intermittent lower abdominal pain with no nausea, vomiting, fever, chills, vaginal bleeding, or rectal bleeding.  She reports using miralax and stool softeners for constipation for the past several days.  Instructed to stop stool softeners and stimulants and to drink plenty of fluid.  She reports after taking one percocet that the pain resolves.  She reports that she only has three percocet tablets left since discharge from the hospital.  Instructed that Norco 5/325 1 tablet every six hours as needed for severe pain, #20, 0 refills would be called into [facility] pharmacy for her.  Instructed to monitor and inform the office if the pain and diarrhea persist or worsen.  Pt verbalizing understanding.

## 2012-08-18 ENCOUNTER — Encounter: Payer: Self-pay | Admitting: Gynecologic Oncology

## 2012-08-18 ENCOUNTER — Other Ambulatory Visit: Payer: Self-pay | Admitting: Oncology

## 2012-08-18 ENCOUNTER — Ambulatory Visit: Payer: Medicare Other | Attending: Gynecologic Oncology | Admitting: Gynecologic Oncology

## 2012-08-18 VITALS — BP 120/80 | HR 102 | Temp 97.9°F | Resp 18 | Ht 66.0 in | Wt 152.4 lb

## 2012-08-18 DIAGNOSIS — C189 Malignant neoplasm of colon, unspecified: Secondary | ICD-10-CM

## 2012-08-18 DIAGNOSIS — C569 Malignant neoplasm of unspecified ovary: Secondary | ICD-10-CM

## 2012-08-18 DIAGNOSIS — C182 Malignant neoplasm of ascending colon: Secondary | ICD-10-CM | POA: Insufficient documentation

## 2012-08-18 NOTE — Progress Notes (Signed)
Office Visit Note: Gyn-Onc    [name] 68 y.o. female  CC:  Chief Complaint  Patient presents with  . Ovarian Cancer    Follow up  Treatment planning  HPI: 67 y.o.  y/o G2P2 LNMP 40's  presented in April 2013 with abdominal discomfort, diarrhea, nausea, no constipation  that was thought to be IBD. The symptoms persisted and she was referred for colonoscopy 01/26/2012.  Findings were notable for  A large ulcer with heaped up margins in the ascending colon.  It was difficult to see and to obtain biopsies.    Pathology was c/w adenocarcinoma and the concern that it was not a primary colonic adenocarcinoma.    CT abdomen and pelvis 02/01/2012 with a 5x6.2 cmcomplex solid and cystic left adnexal mass, 3.9x2.9 right ovarian soft tissue mass, omental caking and peritoneal carcinomatosis.    The initial CA 125 returned > 22,000 CEA wnl, immuno stains of the biopsy were not c/w colon cancer.  Cr was > 8 and the patient was admitted with acute renal failure. Cytology from paracentesis was c/w Gyn primary. She has received 6 cycles of dose dense Taxol/carboplatin. Cycle 6was administered on 06/23/2012. CA 125 34.7 06/2012  A CT scan of the abdomen and pelvis collected on 05/12/2012 IMPRESSION:  1. Interval response to therapy. There has been near complete resolution of abdominal ascites and peritoneal nodularity. 2. Interval decrease in the size of bilateral adnexal cystic lesions. 3. Small soft tissue attenuating nodule arising from the uterine fundus is unchanged in size from previous exam and is favored to represent a subserosal (exophytic) fibroid. 4. No new or progressive disease identified. 5. Small subpleural nodule in the right middle lobe is stable from previous exam.   In general he 14 2014 she underwent total abdominal hysterectomy bilateral salpingo-oophorectomy omentectomy resection of the midportion of the ascending colon with ileotransverse enterocolostomy. 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  Postoperatively [name] reported constipation. At this time should her report is that of a decreased appetite. She denies nausea and vomiting she reports regular passage of flatus and regular bowel movements.  Past Surgical Hx:  Past Surgical History  Procedure Date  . No past surgeries   . Laparotomy 07/26/2012    Procedure: EXPLORATORY LAPAROTOMY;  Surgeon: Laurette Schimke, MD PHD;  Location: [facility];  Service: Gynecology;  Laterality: N/A;  Exploratory Laporatomy/Total Abdominal Hysterectomy Bilatderal Salpingo Oopherectomy/Tumor Debulking  . Abdominal hysterectomy 07/26/2012    Procedure: HYSTERECTOMY ABDOMINAL;  Surgeon: Laurette Schimke, MD PHD;  Location: [facility];  Service: Gynecology;;  . Salpingoophorectomy 07/26/2012    Procedure: SALPINGO OOPHORECTOMY;  Surgeon: Laurette Schimke, MD PHD;  Location: [facility];  Service: Gynecology;  Laterality: Bilateral;  . Omentectomy 07/26/2012    Procedure: OMENTECTOMY;  Surgeon: Laurette Schimke, MD PHD;  Location: [facility];  Service: Gynecology;;  . Colostomy revision 07/26/2012    Procedure: COLON RESECTION RIGHT;  Surgeon: Laurette Schimke, MD PHD;  Location: [facility];  Service: Gynecology;;  with reanastomsis    Past Medical Hx:  Past Medical History  Diagnosis Date  . Diabetes mellitus   . Hyperlipidemia   . Ovarian cancer   . Hypertension     no current meds for since aug 2013  . Anemia   Last mammogram 2  years ago wnl.  Past Gynecological History: G2 P2 LNMP in 40's.  Menarche 12 regular menses until menopause.  No contraceptives used.  No h/o abnormal pap test, Last pap 10 years ago.  NSVD x 2.  No LMP recorded. Patient is postmenopausal.  Family Hx:  Family History  Problem Relation Age of  Onset  . Colon cancer Neg Hx   . Esophageal cancer Neg Hx   . Stomach cancer Neg Hx   . Rectal cancer Neg Hx     Review of Systems:  Constitutional  Feels very weak  Cardiovascular  No chest pain, shortness of breath,  Pulmonary  No cough or wheeze.  Gastro Intestinal  Denies nausea,  No bright red blood per rectum.  No constipation, no diarrhea Genito Urinary  No vaginal bleeding or discharge  Vitals:  Blood pressure 120/80, pulse 102, temperature 97.9 F (36.6 C), resp. rate 18, height 5\' 6"  (1.676 m), weight 152 lb 6.4 oz (69.128 kg).  Physical Exam: Thin WD in NAD. Appears exhausted Chest: Clear to auscultation Heart: Regular rate and rhythm normal S1-S2 Abdomen: Soft nontender no rebound or guarding.  Incision clean dry and intact.  Normal BS. Back: no CVA tenderness Pelvic: Normal external genitalia Bartholin's urethra and Skene's glands. Vagina cuff intact no nodularity no vaginal discharge or bleeding Extremities: 1-2+ lower extremity edema bilaterally   Assessment/Plan:  Ann Baldwin  is a 67 y.o.  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 Molecular testing was performed without any evidence of loss of expression of MSH6, PMS2 MLH1 and MSH2.   In the absence of the primary colonic cancer my recommendation would have been for 3 additional cycles of Taxol and carboplatin therapy given the fact that the tumor was debulked to no gross residual disease. However faced with the confirmation of a colonic cancer I will leave it to Dr. Precious Reel assessment regarding the best chemotherapy regimen that  Is necessary in this situation.  Low fiber low residue diet until her appetite improves. Patient's been advised  to the call our office if there's no improvement in her appetite. At that time will seriously consider addition of Megace to her medications. A followup appointment is made with Dr. Darrold Span 15/20 minutes were spent in discussion of treatment planning.  Laurette Schimke, MD, PhD 08/18/2012, 4:51 PM

## 2012-08-18 NOTE — Progress Notes (Signed)
Note of information  Surgery 07-26-12 found synchronous ovarian and colon ca. POF to schedulers now.  Ila Mcgill, MD

## 2012-08-18 NOTE — Patient Instructions (Addendum)
Low fiber low residue diet until your appetite improves. Call our office if there's no improvement in your appetite.   A followup appointment is made with Dr. Darrold Span

## 2012-08-19 ENCOUNTER — Telehealth: Payer: Self-pay | Admitting: Oncology

## 2012-08-19 ENCOUNTER — Other Ambulatory Visit: Payer: Self-pay | Admitting: *Deleted

## 2012-08-19 DIAGNOSIS — C569 Malignant neoplasm of unspecified ovary: Secondary | ICD-10-CM

## 2012-08-19 MED ORDER — LORAZEPAM 0.5 MG PO TABS: ORAL_TABLET | ORAL | Status: DC

## 2012-08-21 ENCOUNTER — Emergency Department (HOSPITAL_COMMUNITY): Payer: Medicare Other

## 2012-08-21 ENCOUNTER — Encounter (HOSPITAL_COMMUNITY): Payer: Self-pay | Admitting: *Deleted

## 2012-08-21 ENCOUNTER — Inpatient Hospital Stay (HOSPITAL_COMMUNITY)
Admission: EM | Admit: 2012-08-21 | Discharge: 2012-08-24 | DRG: 689 | Disposition: A | Discharge status: Home / Self Care | Payer: Medicare Other | Attending: Internal Medicine | Admitting: Internal Medicine

## 2012-08-21 DIAGNOSIS — N390 Urinary tract infection, site not specified: Principal | ICD-10-CM | POA: Diagnosis present

## 2012-08-21 DIAGNOSIS — E860 Dehydration: Secondary | ICD-10-CM

## 2012-08-21 DIAGNOSIS — J189 Pneumonia, unspecified organism: Secondary | ICD-10-CM | POA: Diagnosis present

## 2012-08-21 DIAGNOSIS — Z794 Long term (current) use of insulin: Secondary | ICD-10-CM

## 2012-08-21 DIAGNOSIS — E119 Type 2 diabetes mellitus without complications: Secondary | ICD-10-CM | POA: Diagnosis present

## 2012-08-21 DIAGNOSIS — Z7982 Long term (current) use of aspirin: Secondary | ICD-10-CM

## 2012-08-21 DIAGNOSIS — Z79899 Other long term (current) drug therapy: Secondary | ICD-10-CM

## 2012-08-21 DIAGNOSIS — D72829 Elevated white blood cell count, unspecified: Secondary | ICD-10-CM | POA: Diagnosis present

## 2012-08-21 DIAGNOSIS — Z85038 Personal history of other malignant neoplasm of large intestine: Secondary | ICD-10-CM

## 2012-08-21 DIAGNOSIS — B370 Candidal stomatitis: Secondary | ICD-10-CM | POA: Diagnosis present

## 2012-08-21 DIAGNOSIS — E785 Hyperlipidemia, unspecified: Secondary | ICD-10-CM | POA: Diagnosis present

## 2012-08-21 DIAGNOSIS — D62 Acute posthemorrhagic anemia: Secondary | ICD-10-CM | POA: Diagnosis present

## 2012-08-21 DIAGNOSIS — I10 Essential (primary) hypertension: Secondary | ICD-10-CM | POA: Diagnosis present

## 2012-08-21 DIAGNOSIS — C569 Malignant neoplasm of unspecified ovary: Secondary | ICD-10-CM | POA: Diagnosis present

## 2012-08-21 DIAGNOSIS — E876 Hypokalemia: Secondary | ICD-10-CM | POA: Diagnosis present

## 2012-08-21 HISTORY — DX: Malignant neoplasm of colon, unspecified: C18.9

## 2012-08-21 LAB — CBC WITH DIFFERENTIAL/PLATELET
Basophils Relative: 0 % (ref 0–1)
Eosinophils Absolute: 0 10*3/uL (ref 0.0–0.7)
HCT: 31.3 % — ABNORMAL LOW (ref 36.0–46.0)
Hemoglobin: 10.4 g/dL — ABNORMAL LOW (ref 12.0–15.0)
MCH: 31.3 pg (ref 26.0–34.0)
MCHC: 33.2 g/dL (ref 30.0–36.0)
MCV: 94.3 fL (ref 78.0–100.0)
Monocytes Absolute: 1.4 10*3/uL — ABNORMAL HIGH (ref 0.1–1.0)
Monocytes Relative: 8 % (ref 3–12)

## 2012-08-21 LAB — URINE MICROSCOPIC-ADD ON

## 2012-08-21 LAB — D-DIMER, QUANTITATIVE: D-Dimer, Quant: 1.95 ug/mL-FEU — ABNORMAL HIGH (ref 0.00–0.48)

## 2012-08-21 LAB — COMPREHENSIVE METABOLIC PANEL
Albumin: 2.2 g/dL — ABNORMAL LOW (ref 3.5–5.2)
BUN: 13 mg/dL (ref 6–23)
Creatinine, Ser: 0.82 mg/dL (ref 0.50–1.10)
GFR calc Af Amer: 83 mL/min — ABNORMAL LOW (ref >90)
Total Bilirubin: 0.3 mg/dL (ref 0.3–1.2)
Total Protein: 6.7 g/dL (ref 6.0–8.3)

## 2012-08-21 LAB — URINALYSIS, ROUTINE W REFLEX MICROSCOPIC
Glucose, UA: NEGATIVE mg/dL
Nitrite: NEGATIVE
Protein, ur: 30 mg/dL — AB

## 2012-08-21 MED ORDER — ACETAMINOPHEN 325 MG PO TABS
650.0000 mg | ORAL_TABLET | Freq: Once | ORAL | Status: AC
  Administered 2012-08-21: 650 mg via ORAL
  Filled 2012-08-21: qty 2

## 2012-08-21 MED ORDER — ONDANSETRON HCL 4 MG/2ML IJ SOLN
4.0000 mg | Freq: Once | INTRAMUSCULAR | Status: AC
  Administered 2012-08-21: 4 mg via INTRAVENOUS
  Filled 2012-08-21: qty 2

## 2012-08-21 MED ORDER — SODIUM CHLORIDE 0.9 % IV BOLUS (SEPSIS)
1000.0000 mL | Freq: Once | INTRAVENOUS | Status: AC
  Administered 2012-08-21: 1000 mL via INTRAVENOUS

## 2012-08-21 MED ORDER — LEVOFLOXACIN IN D5W 750 MG/150ML IV SOLN
750.0000 mg | INTRAVENOUS | Status: AC
  Administered 2012-08-22 – 2012-08-23 (×3): 750 mg via INTRAVENOUS
  Filled 2012-08-21 (×4): qty 150

## 2012-08-21 MED ORDER — VANCOMYCIN HCL IN DEXTROSE 1-5 GM/200ML-% IV SOLN
1000.0000 mg | Freq: Once | INTRAVENOUS | Status: AC
  Administered 2012-08-21: 1000 mg via INTRAVENOUS
  Filled 2012-08-21: qty 200

## 2012-08-21 MED ORDER — DEXTROSE 5 % IV SOLN
1.0000 g | Freq: Three times a day (TID) | INTRAVENOUS | Status: DC
  Administered 2012-08-22 – 2012-08-24 (×8): 1 g via INTRAVENOUS
  Filled 2012-08-21 (×10): qty 1

## 2012-08-21 MED ORDER — MORPHINE SULFATE 4 MG/ML IJ SOLN
4.0000 mg | Freq: Once | INTRAMUSCULAR | Status: AC
  Administered 2012-08-21: 4 mg via INTRAVENOUS
  Filled 2012-08-21: qty 1

## 2012-08-21 NOTE — ED Notes (Addendum)
PA resident at bedside 

## 2012-08-21 NOTE — ED Provider Notes (Signed)
History     CSN: [id number]  Arrival date & time 08/21/12  2037   First MD Initiated Contact with Patient 08/21/12 2110      Chief Complaint  Patient presents with  . Fatigue  . Fever    (Consider location/radiation/quality/duration/timing/severity/associated sxs/prior treatment) HPI History provided by pt.   Pt had a total hyst as well as colon resection on 07/26/12 d/t ovarian cancer.  She is not currently receiving radiation or chemotherapy.  Presents to ED w/ cc of anorexia, generalized weakness, chills and intermittent nausea.  Has pain in right lower abdomen as well, though both abdominal pain and nausea have been present since procedure, are currently stable, and her gynecologist was not concerned following her exam 3 days ago.  Pt has also had a cough for 2-3 weeks.  Had mild SOB while lying in her bed this evening.  Denies fever, CP, urinary sx, LE edema/ttp.  She is passing gas and had a nml BM this morning.  No other past abdominal surgeries.   Past Medical History  Diagnosis Date  . Diabetes mellitus   . Hyperlipidemia   . Ovarian cancer   . Hypertension     no current meds for since aug 2013  . Anemia   . Colon cancer     Past Surgical History  Procedure Laterality Date  . No past surgeries    . Laparotomy  07/26/2012    Procedure: EXPLORATORY LAPAROTOMY;  Surgeon: Laurette Schimke, MD PHD;  Location: [facility];  Service: Gynecology;  Laterality: N/A;  Exploratory Laporatomy/Total Abdominal Hysterectomy Bilatderal Salpingo Oopherectomy/Tumor Debulking  . Abdominal hysterectomy  07/26/2012    Procedure: HYSTERECTOMY ABDOMINAL;  Surgeon: Laurette Schimke, MD PHD;  Location: [facility];  Service: Gynecology;;  . Salpingoophorectomy  07/26/2012    Procedure: SALPINGO OOPHORECTOMY;  Surgeon: Laurette Schimke, MD PHD;  Location: [facility];  Service: Gynecology;  Laterality: Bilateral;  . Omentectomy  07/26/2012    Procedure: OMENTECTOMY;  Surgeon: Laurette Schimke, MD PHD;  Location: [facility];   Service: Gynecology;;  . Colostomy revision  07/26/2012    Procedure: COLON RESECTION RIGHT;  Surgeon: Laurette Schimke, MD PHD;  Location: [facility];  Service: Gynecology;;  with reanastomsis    Family History  Problem Relation Age of Onset  . Colon cancer Neg Hx   . Esophageal cancer Neg Hx   . Stomach cancer Neg Hx   . Rectal cancer Neg Hx     History  Substance Use Topics  . Smoking status: Never Smoker   . Smokeless tobacco: Never Used  . Alcohol Use: No    OB History   Grav Para Term Preterm Abortions TAB SAB Ect Mult Living                  Review of Systems  All other systems reviewed and are negative.    Allergies  Review of patient's allergies indicates no known allergies.  Home Medications   Current Outpatient Rx  Name  Route  Sig  Dispense  Refill  . aspirin 81 MG tablet   Oral   Take 81 mg by mouth every morning.          Marland Kitchen atorvastatin (LIPITOR) 40 MG tablet   Oral   Take 40 mg by mouth at bedtime.          Marland Kitchen dexamethasone (DECADRON) 4 MG tablet   Oral   Take 4 mg by mouth 2 (two) times daily with a meal. 5 tablets (20  mg) PO with food 12 hours prior to taxol chemo.         . docusate sodium (COLACE) 100 MG capsule   Oral   Take 100 mg by mouth at bedtime.         . enoxaparin (LOVENOX) 40 MG/0.4ML injection   Subcutaneous   Inject 0.4 mLs (40 mg total) into the skin daily.   19 Syringe   0   . ezetimibe (ZETIA) 10 MG tablet   Oral   Take 10 mg by mouth every morning.          Marland Kitchen HYDROcodone-acetaminophen (NORCO/VICODIN) 5-325 MG per tablet   Oral   Take 1 tablet by mouth every 8 (eight) hours as needed.         . insulin regular (NOVOLIN R,HUMULIN R) 100 units/mL injection   Subcutaneous   Inject 2-6 Units into the skin 2 (two) times daily before a meal. Sliding scale: BS 0-199 NO INSULIN BS 200-250   2 UNITS  BS > 250-300  4 UNITS  BS >300  6  UNITS         . iron polysaccharides (NIFEREX) 150 MG capsule   Oral   Take  150 mg by mouth every evening.          Marland Kitchen LORazepam (ATIVAN) 0.5 MG tablet      TAKE ONE TABLET PO/SL EVERY SIX HOURS PRN NAUSEA.   20 tablet   0     CALLED TO PHARMACIST.   Marland Kitchen ondansetron (ZOFRAN) 8 MG tablet   Oral   Take 8 mg by mouth every 8 (eight) hours as needed. Nausea         . oxyCODONE-acetaminophen (PERCOCET/ROXICET) 5-325 MG per tablet   Oral   Take 1-2 tablets by mouth every 6 (six) hours as needed (moderate to severe pain (when tolerating fluids)).   30 tablet   0   . polyethylene glycol (MIRALAX / GLYCOLAX) packet   Oral   Take 17 g by mouth 2 (two) times daily as needed. constipation           BP 115/55  Pulse 108  Temp(Src) 100.3 F (37.9 C)  Resp 20  SpO2 97%  Physical Exam  Nursing note and vitals reviewed. Constitutional: She is oriented to person, place, and time. She appears well-developed and well-nourished. No distress.  HENT:  Head: Normocephalic and atraumatic.  Dry mucous membranes  Eyes:  Normal appearance  Neck: Normal range of motion.  Cardiovascular: Normal rate and regular rhythm.   Pulmonary/Chest: Effort normal and breath sounds normal. No respiratory distress.  Abdominal: Soft. Bowel sounds are normal. She exhibits no distension and no mass. There is no tenderness. There is no rebound and no guarding.  Mid-line surgical scar that appears to be healing well. No induration, erythema, drainage.  Mild tenderness mid-line as well as RLQ.    Genitourinary:  No CVA tenderness  Musculoskeletal: Normal range of motion.  No LE edema or ttp  Neurological: She is alert and oriented to person, place, and time.  Skin: Skin is warm and dry. No rash noted.  Psychiatric: She has a normal mood and affect. Her behavior is normal.    ED Course  Procedures (including critical care time)  Labs Reviewed  CBC WITH DIFFERENTIAL - Abnormal; Notable for the following:    WBC 18.2 (*)    RBC 3.32 (*)    Hemoglobin 10.4 (*)    HCT 31.3 (*)  RDW 15.6 (*)    Platelets 445 (*)    Neutrophils Relative 84 (*)    Neutro Abs 15.3 (*)    Lymphocytes Relative 8 (*)    Monocytes Absolute 1.4 (*)    All other components within normal limits  COMPREHENSIVE METABOLIC PANEL - Abnormal; Notable for the following:    Sodium 132 (*)    Chloride 95 (*)    Glucose, Bld 156 (*)    Albumin 2.2 (*)    GFR calc non Af Amer 72 (*)    GFR calc Af Amer 83 (*)    All other components within normal limits  URINALYSIS, ROUTINE W REFLEX MICROSCOPIC - Abnormal; Notable for the following:    APPearance CLOUDY (*)    Bilirubin Urine SMALL (*)    Ketones, ur TRACE (*)    Protein, ur 30 (*)    Leukocytes, UA MODERATE (*)    All other components within normal limits  D-DIMER, QUANTITATIVE - Abnormal; Notable for the following:    D-Dimer, Quant 1.95 (*)    All other components within normal limits  GLUCOSE, CAPILLARY - Abnormal; Notable for the following:    Glucose-Capillary 131 (*)    All other components within normal limits  URINE MICROSCOPIC-ADD ON - Abnormal; Notable for the following:    Squamous Epithelial / LPF FEW (*)    Bacteria, UA FEW (*)    All other components within normal limits  URINE CULTURE  LIPASE, BLOOD   Dg Chest 2 View  08/21/2012  *RADIOLOGY REPORT*  Clinical Data: Shortness breath.  Chest pain.  Weakness.  CHEST - 2 VIEW  Comparison: 07/22/2012  Findings: Low lung volumes are present, causing crowding of the pulmonary vasculature.  Cardiac and mediastinal contours appear unremarkable.  This day 1.8 x 0.9 cm density at the left lung base, probably in the lingula.  The lungs appear otherwise clear.  No pleural effusion observed.  IMPRESSION:  1.  A band-like density at the left lung base is new compared to 07/22/2012, and may reflect atelectasis or less likely early pneumonia in the lingula.   Original Report Authenticated By: Gaylyn Rong, M.D.      1. HCAP (healthcare-associated pneumonia)   2. UTI (lower urinary  tract infection)   3. Dehydration   4. Leukocytosis, unspecified   5. Acute blood loss anemia   6. Diabetes mellitus   7. Hypertension   8. Ovarian ca       MDM  66yo F 1 month s/p total hyst and colon resection w/ 5 day admission d/t metastatic ovarian cancer, presents w/ fever, anorexia, generalized weakness and 2-3 weeks of cough.  On exam, febrile, non-toxic appearing, dehydrated and mildly tachycardic, no respiratory distress, abd soft and mildly tender at surgical sites, no signs of DVT.  D-dimer ordered initially d/t recent surgery, report of SOB as well as tachycardia on exam.  It is elevated at 1.95.  After reassessing patient, low clinical suspicion for PE.  Tachy is likely secondary to fever and dehydration and the SOB lasted for brief moment this evening when standing up from couch.  CXR shows possible lingular pneumonia and U/A positive for infection.  Labs otherwise unremarkable. Pt wishes to be admitted because she is not able to re-initiate chemo until she is well.  Will admit patient for HCAP, UTI and dehydration.  IV cefepime, vanc and levaquin ordered.  Will recheck VS.  Pt reports that her nausea and pain are currently improved.  [facility] consulted  for admission.  11:38 PM         Otilio Miu, PA-C 08/22/12 2027

## 2012-08-21 NOTE — ED Notes (Signed)
Pt c/o not feeling good; pt had hysterectomy/colon surgery 1/14; after discharge from hospital pt had some vomiting; can't eat; lower right sided abd pain; nauseated; feels dehydrated and weak; shortness of breath

## 2012-08-21 NOTE — ED Notes (Addendum)
PA resident left bedside.

## 2012-08-21 NOTE — ED Notes (Signed)
PA at bedside.

## 2012-08-22 ENCOUNTER — Telehealth: Payer: Self-pay | Admitting: Gynecologic Oncology

## 2012-08-22 ENCOUNTER — Telehealth: Payer: Self-pay | Admitting: Oncology

## 2012-08-22 ENCOUNTER — Observation Stay (HOSPITAL_COMMUNITY): Payer: Medicare Other

## 2012-08-22 ENCOUNTER — Other Ambulatory Visit: Payer: Self-pay | Admitting: *Deleted

## 2012-08-22 DIAGNOSIS — D62 Acute posthemorrhagic anemia: Secondary | ICD-10-CM | POA: Insufficient documentation

## 2012-08-22 DIAGNOSIS — E860 Dehydration: Secondary | ICD-10-CM | POA: Insufficient documentation

## 2012-08-22 DIAGNOSIS — N390 Urinary tract infection, site not specified: Secondary | ICD-10-CM | POA: Diagnosis present

## 2012-08-22 DIAGNOSIS — D72829 Elevated white blood cell count, unspecified: Secondary | ICD-10-CM | POA: Diagnosis present

## 2012-08-22 DIAGNOSIS — J189 Pneumonia, unspecified organism: Secondary | ICD-10-CM

## 2012-08-22 HISTORY — DX: Pneumonia, unspecified organism: J18.9

## 2012-08-22 LAB — BASIC METABOLIC PANEL
CO2: 26 mEq/L (ref 19–32)
Calcium: 8.3 mg/dL — ABNORMAL LOW (ref 8.4–10.5)
Chloride: 100 mEq/L (ref 96–112)
Sodium: 134 mEq/L — ABNORMAL LOW (ref 135–145)

## 2012-08-22 LAB — CBC
MCV: 94.8 fL (ref 78.0–100.0)
Platelets: 408 10*3/uL — ABNORMAL HIGH (ref 150–400)
RBC: 2.9 MIL/uL — ABNORMAL LOW (ref 3.87–5.11)
WBC: 14.1 10*3/uL — ABNORMAL HIGH (ref 4.0–10.5)

## 2012-08-22 MED ORDER — ONDANSETRON HCL 4 MG/2ML IJ SOLN: 4.0000 mg | Freq: Three times a day (TID) | INTRAMUSCULAR | Status: AC | PRN

## 2012-08-22 MED ORDER — OXYCODONE HCL 5 MG PO TABS
5.0000 mg | ORAL_TABLET | ORAL | Status: DC | PRN
  Administered 2012-08-24: 5 mg via ORAL
  Filled 2012-08-22: qty 1

## 2012-08-22 MED ORDER — SODIUM CHLORIDE 0.9 % IV SOLN: INTRAVENOUS | Status: AC

## 2012-08-22 MED ORDER — VANCOMYCIN HCL 1000 MG IV SOLR
750.0000 mg | Freq: Two times a day (BID) | INTRAVENOUS | Status: DC
  Administered 2012-08-22 – 2012-08-24 (×4): 750 mg via INTRAVENOUS
  Filled 2012-08-22 (×6): qty 750

## 2012-08-22 MED ORDER — GLUCERNA SHAKE PO LIQD
237.0000 mL | Freq: Three times a day (TID) | ORAL | Status: DC
  Administered 2012-08-22 – 2012-08-24 (×4): 237 mL via ORAL
  Filled 2012-08-22 (×8): qty 237

## 2012-08-22 MED ORDER — ONDANSETRON HCL 4 MG/2ML IJ SOLN
4.0000 mg | Freq: Four times a day (QID) | INTRAMUSCULAR | Status: DC | PRN
  Administered 2012-08-22: 4 mg via INTRAVENOUS
  Filled 2012-08-22: qty 2

## 2012-08-22 MED ORDER — IOHEXOL 350 MG/ML SOLN
100.0000 mL | Freq: Once | INTRAVENOUS | Status: AC | PRN
  Administered 2012-08-22: 100 mL via INTRAVENOUS

## 2012-08-22 MED ORDER — MORPHINE SULFATE 4 MG/ML IJ SOLN: 4.0000 mg | Freq: Once | INTRAMUSCULAR | Status: DC

## 2012-08-22 MED ORDER — ALBUTEROL SULFATE (5 MG/ML) 0.5% IN NEBU: 2.5000 mg | INHALATION_SOLUTION | Freq: Four times a day (QID) | RESPIRATORY_TRACT | Status: DC | PRN

## 2012-08-22 MED ORDER — ZOLPIDEM TARTRATE 5 MG PO TABS
5.0000 mg | ORAL_TABLET | Freq: Every evening | ORAL | Status: DC | PRN
  Filled 2012-08-22: qty 1

## 2012-08-22 MED ORDER — ACETAMINOPHEN 650 MG RE SUPP: 650.0000 mg | Freq: Four times a day (QID) | RECTAL | Status: DC | PRN

## 2012-08-22 MED ORDER — ENOXAPARIN SODIUM 40 MG/0.4ML ~~LOC~~ SOLN
40.0000 mg | SUBCUTANEOUS | Status: DC
  Administered 2012-08-22 – 2012-08-24 (×3): 40 mg via SUBCUTANEOUS
  Filled 2012-08-22 (×3): qty 0.4

## 2012-08-22 MED ORDER — ACETAMINOPHEN 325 MG PO TABS: 650.0000 mg | ORAL_TABLET | Freq: Four times a day (QID) | ORAL | Status: DC | PRN

## 2012-08-22 MED ORDER — SODIUM CHLORIDE 0.9 % IV SOLN
INTRAVENOUS | Status: DC
  Administered 2012-08-22 (×2): via INTRAVENOUS

## 2012-08-22 MED ORDER — ALUM & MAG HYDROXIDE-SIMETH 200-200-20 MG/5ML PO SUSP: 30.0000 mL | Freq: Four times a day (QID) | ORAL | Status: DC | PRN

## 2012-08-22 MED ORDER — HYDROMORPHONE HCL PF 1 MG/ML IJ SOLN
0.5000 mg | INTRAMUSCULAR | Status: DC | PRN
  Administered 2012-08-22 – 2012-08-23 (×8): 1 mg via INTRAVENOUS
  Filled 2012-08-22 (×8): qty 1

## 2012-08-22 MED ORDER — ONDANSETRON HCL 4 MG PO TABS: 4.0000 mg | ORAL_TABLET | Freq: Four times a day (QID) | ORAL | Status: DC | PRN

## 2012-08-22 NOTE — Care Management Note (Unsigned)
    Page 1 of 1   08/22/2012     3:35:02 PM   CARE MANAGEMENT NOTE 08/22/2012  Patient:  Ann Baldwin, Ann Baldwin   Account Number:  [id number]  Date Initiated:  08/22/2012  Documentation initiated by:  Lanier Clam  Subjective/Objective Assessment:   ADMITTED W/SOB.PNA.VW:[id number] CA,RECENT HYSTERECTOMY.     Action/Plan:   FROM HOME.HAS PCP,PHARMACY.   Anticipated DC Date:     Anticipated DC Plan:           Choice offered to / List presented to:             Status of service:  In process, will continue to follow Medicare Important Message given?   (If response is "NO", the following Medicare IM given date fields will be blank) Date Medicare IM given:   Date Additional Medicare IM given:    Discharge Disposition:    Per UR Regulation:  Reviewed for med. necessity/level of care/duration of stay  If discussed at Long Length of Stay Meetings, dates discussed:    Comments:  08/22/12 [facility] RN,BSN NCM [phone]

## 2012-08-22 NOTE — Progress Notes (Signed)
INITIAL NUTRITION ASSESSMENT  DOCUMENTATION CODES Per approved criteria  -Not Applicable   INTERVENTION: Provide Glucerna TID Encourage PO intake  NUTRITION DIAGNOSIS: Inadequate oral intake related to current illness as evidenced by reported poor po intake x 3 weeks and 7 lb wt loss in past month.   Goal: Pt to meet >/= 90% of their estimated nutrition needs  Monitor:  PO intake Wt/wt change  Reason for Assessment: MST score of 2  67 y.o. female  Admitting Dx: HCAP (healthcare-associated pneumonia)  ASSESSMENT: Pt presents with complaints of fever, chills, and cough for the past 3 weeks since her hysterectomy for Ovarian cancer on 07/26/12. Pt was found to have pneumonia and UTI in the ED. Pt also complains of nausea and reports that she has had poor appetite with very little po intake for the past 3 weeks. Pt states she drinks 1 Ensure daily at home and drinks plenty but hasn't been eating for 3 weeks. Pt ate only 5% of breakfast.   Height: Ht Readings from Last 1 Encounters:  08/22/12 5\' 3"  (1.6 m)    Weight: Wt Readings from Last 1 Encounters:  08/22/12 153 lb 14.1 oz (69.8 kg)    Ideal Body Weight: 115 lb  % Ideal Body Weight: 133%  Wt Readings from Last 10 Encounters:  08/22/12 153 lb 14.1 oz (69.8 kg)  08/18/12 152 lb 6.4 oz (69.128 kg)  07/26/12 159 lb 9.6 oz (72.394 kg)  07/26/12 159 lb 9.6 oz (72.394 kg)  07/22/12 159 lb 9.6 oz (72.394 kg)  07/19/12 160 lb 4.8 oz (72.712 kg)  06/14/12 157 lb 11.2 oz (71.532 kg)  05/19/12 152 lb 4.8 oz (69.083 kg)  04/25/12 148 lb 12.8 oz (67.495 kg)  04/04/12 147 lb 12.8 oz (67.042 kg)    Usual Body Weight: 160 lb  % Usual Body Weight: 96%  BMI:  Body mass index is 27.27 kg/(m^2).  Estimated Nutritional Needs: Kcal: 1551-1810 Protein: 70-84 grams Fluid: >1.8L/day  Skin: clean dry abdominal incision  Diet Order: Cardiac  EDUCATION NEEDS: -No education needs identified at this time   Intake/Output  Summary (Last 24 hours) at 08/22/12 1411 Last data filed at 08/22/12 0700  Gross per 24 hour  Intake    350 ml  Output      0 ml  Net    350 ml    Last BM: 2/9  Labs:   Recent Labs Lab 08/21/12 2100 08/22/12 0430  NA 132* 134*  K 4.7 4.1  CL 95* 100  CO2 27 26  BUN 13 10  CREATININE 0.82 0.82  CALCIUM 8.7 8.3*  GLUCOSE 156* 167*   Lab Results  Component Value Date   HGBA1C 6.1* 02/11/2012     CBG (last 3)   Recent Labs  08/21/12 2213  GLUCAP 131*    Scheduled Meds: . sodium chloride   Intravenous STAT  . ceFEPime (MAXIPIME) IV  1 g Intravenous Q8H  . enoxaparin (LOVENOX) injection  40 mg Subcutaneous Q24H  . levofloxacin (LEVAQUIN) IV  750 mg Intravenous Q24H  .  morphine injection  4 mg Intravenous Once  . vancomycin  750 mg Intravenous Q12H    Continuous Infusions: . sodium chloride 75 mL/hr at 08/22/12 0350    Past Medical History  Diagnosis Date  . Diabetes mellitus   . Hyperlipidemia   . Ovarian cancer   . Hypertension     no current meds for since aug 2013  . Anemia   . Colon cancer  Past Surgical History  Procedure Laterality Date  . No past surgeries    . Laparotomy  07/26/2012    Procedure: EXPLORATORY LAPAROTOMY;  Surgeon: Laurette Schimke, MD PHD;  Location: [facility];  Service: Gynecology;  Laterality: N/A;  Exploratory Laporatomy/Total Abdominal Hysterectomy Bilatderal Salpingo Oopherectomy/Tumor Debulking  . Abdominal hysterectomy  07/26/2012    Procedure: HYSTERECTOMY ABDOMINAL;  Surgeon: Laurette Schimke, MD PHD;  Location: [facility];  Service: Gynecology;;  . Salpingoophorectomy  07/26/2012    Procedure: SALPINGO OOPHORECTOMY;  Surgeon: Laurette Schimke, MD PHD;  Location: [facility];  Service: Gynecology;  Laterality: Bilateral;  . Omentectomy  07/26/2012    Procedure: OMENTECTOMY;  Surgeon: Laurette Schimke, MD PHD;  Location: [facility];  Service: Gynecology;;  . Colostomy revision  07/26/2012    Procedure: COLON RESECTION RIGHT;  Surgeon: Laurette Schimke, MD PHD;  Location: [facility];  Service: Gynecology;;  with reanastomsis    Ian Malkin RD, LDN Inpatient Clinical Dietitian Pager: [phone] After Hours Pager: [phone]

## 2012-08-22 NOTE — ED Notes (Signed)
Talked to pharmacy about the levaquin and zofran interaction. Informed that it cannot be given together, but can be given to pt as long as line if flushed clean of previous medication. Will inform receiving RN.

## 2012-08-22 NOTE — Progress Notes (Signed)
ANTIBIOTIC CONSULT NOTE - INITIAL  Pharmacy Consult for vancomycin/levofloxacin/cefepime Indication: pneumonia  No Known Allergies  Patient Measurements: Height: 5\' 3"  (160 cm) Weight: 153 lb 14.1 oz (69.8 kg) IBW/kg (Calculated) : 52.4 Adjusted Body Weight:   Vital Signs: Temp: 98.5 F (36.9 C) (02/10 0151) Temp src: Oral (02/10 0151) BP: 118/51 mmHg (02/10 0151) Pulse Rate: 100 (02/10 0151) Intake/Output from previous day: 02/09 0701 - 02/10 0700 In: 150 [IV Piggyback:150] Out: -  Intake/Output from this shift: Total I/O In: 150 [IV Piggyback:150] Out: -   Labs:  Recent Labs  08/21/12 2100  WBC 18.2*  HGB 10.4*  PLT 445*  CREATININE 0.82   Estimated Creatinine Clearance: 61.6 ml/min (by C-G formula based on Cr of 0.82). No results found for this basename: VANCOTROUGH, VANCOPEAK, VANCORANDOM, GENTTROUGH, GENTPEAK, GENTRANDOM, TOBRATROUGH, TOBRAPEAK, TOBRARND, AMIKACINPEAK, AMIKACINTROU, AMIKACIN,  in the last 72 hours   Microbiology: No results found for this or any previous visit (from the past 720 hour(s)).  Medical History: Past Medical History  Diagnosis Date  . Diabetes mellitus   . Hyperlipidemia   . Ovarian cancer   . Hypertension     no current meds for since aug 2013  . Anemia   . Colon cancer     Medications:  Anti-infectives   Start     Dose/Rate Route Frequency Ordered Stop   08/22/12 1200  vancomycin (VANCOCIN) 750 mg in sodium chloride 0.9 % 150 mL IVPB     750 mg 150 mL/hr over 60 Minutes Intravenous Every 12 hours 08/22/12 0458     08/21/12 2330  ceFEPIme (MAXIPIME) 1 g in dextrose 5 % 50 mL IVPB     1 g 100 mL/hr over 30 Minutes Intravenous 3 times per day 08/21/12 2322 08/29/12 2159   08/21/12 2330  levofloxacin (LEVAQUIN) IVPB 750 mg     750 mg 100 mL/hr over 90 Minutes Intravenous Every 24 hours 08/21/12 2322 08/24/12 2159   08/21/12 2330  vancomycin (VANCOCIN) IVPB 1000 mg/200 mL premix     1,000 mg 200 mL/hr over 60 Minutes  Intravenous  Once 08/21/12 2324 08/22/12 0101     Assessment: Patient with PNA.  First dose of antibiotics already given in ED.  Goal of Therapy:  Vancomycin trough level 15-20 mcg/ml Cefepime/levofloxacin dosed based on patient weight and renal function  Plan:  Measure antibiotic drug levels at steady state Follow up culture results Vancomycin 750mg  iv q12hr Levofloxacin 750mg  iv q24hr Cefepime 1gm iv q8hr     Darlina Guys, Roston Grunewald Crowford 08/22/2012,4:58 AM

## 2012-08-22 NOTE — Telephone Encounter (Signed)
See comment in phone note.  L.Livesay MD

## 2012-08-22 NOTE — Telephone Encounter (Signed)
Called to speak with the patient about current status.  It was reported that she contacted Dr. Truett Perna, MD on call this weekend, with complaints of severe weakness.  Called to follow up.  Messages left asking the pt to call the office.

## 2012-08-22 NOTE — H&P (Signed)
[facility] History and Physical  Ann Baldwin BJY:[id number] DOB: 06/10/1946 DOA: 08/21/2012  Referring physician: EDP PCP: Dr.  Cira Servant in [place], [place] Specialists:   Chief Complaint: Fever Cough SOB  HPI: Ann Baldwin is a 67 y.o. female who presents to the ED with complaints of fevers, chills,  and cough for the past 3 weeks since her hysterectomy for Ovarian Cancer on 07/26/2012.   Her cough has been nonproductive.  She has also had nausea and vomiting but denies diarrhea. She reports having intake of foods and liquids and feels that her symptoms have been worsening.  In the ED , she was found to have a Left lingular pneumonia on chest X-Ray, and a UTI.  She was placed on antibiotic therapy to cover both HCAP and the UTI with Vancomycin, Cefepime, and Levaquin.   She was referred for admission.     Review of Systems: The patient denies anorexia, weight loss, vision loss, decreased hearing, hoarseness, chest pain, syncope, dyspnea on exertion, peripheral edema, balance deficits, hemoptysis, abdominal pain, constipation, hematemesis, melena, hematochezia, severe indigestion/heartburn, hematuria, incontinence, suspicious skin lesions, transient blindness, difficulty walking, depression, unusual weight change, abnormal bleeding, enlarged lymph nodes, angioedema, and breast masses.    Past Medical History  Diagnosis Date  . Diabetes mellitus   . Hyperlipidemia   . Ovarian cancer   . Hypertension     no current meds for since aug 2013  . Anemia   . Colon cancer    Past Surgical History  Procedure Laterality Date  . No past surgeries    . Laparotomy  07/26/2012    Procedure: EXPLORATORY LAPAROTOMY;  Surgeon: Laurette Schimke, MD PHD;  Location: [facility];  Service: Gynecology;  Laterality: N/A;  Exploratory Laporatomy/Total Abdominal Hysterectomy Bilatderal Salpingo Oopherectomy/Tumor Debulking  . Abdominal hysterectomy  07/26/2012    Procedure: HYSTERECTOMY ABDOMINAL;  Surgeon:  Laurette Schimke, MD PHD;  Location: [facility];  Service: Gynecology;;  . Salpingoophorectomy  07/26/2012    Procedure: SALPINGO OOPHORECTOMY;  Surgeon: Laurette Schimke, MD PHD;  Location: [facility];  Service: Gynecology;  Laterality: Bilateral;  . Omentectomy  07/26/2012    Procedure: OMENTECTOMY;  Surgeon: Laurette Schimke, MD PHD;  Location: [facility];  Service: Gynecology;;  . Colostomy revision  07/26/2012    Procedure: COLON RESECTION RIGHT;  Surgeon: Laurette Schimke, MD PHD;  Location: [facility];  Service: Gynecology;;  with reanastomsis     Medications:  HOME MEDS: Prior to Admission medications   Medication Sig Start Date End Date Taking? Authorizing Provider  aspirin 81 MG tablet Take 81 mg by mouth every morning.    Yes Historical Provider, MD  atorvastatin (LIPITOR) 40 MG tablet Take 40 mg by mouth at bedtime.    Yes Historical Provider, MD  docusate sodium (COLACE) 100 MG capsule Take 100 mg by mouth at bedtime.   Yes Historical Provider, MD  ezetimibe (ZETIA) 10 MG tablet Take 10 mg by mouth every morning.    Yes Historical Provider, MD  HYDROcodone-acetaminophen (NORCO/VICODIN) 5-325 MG per tablet Take 1 tablet by mouth every 8 (eight) hours as needed for pain (pain).    Yes Historical Provider, MD  insulin regular (NOVOLIN R,HUMULIN R) 100 units/mL injection Inject 2-6 Units into the skin 2 (two) times daily before a meal. Sliding scale: BS 0-199 NO INSULIN BS 200-250   2 UNITS  BS > 250-300  4 UNITS  BS >300  6  UNITS 02/26/12  Yes Lennis Buzzy Han, MD  iron polysaccharides (  NIFEREX) 150 MG capsule Take 150 mg by mouth every evening.  02/17/12 02/16/13 Yes David Tat, MD  LORazepam (ATIVAN) 0.5 MG tablet TAKE ONE TABLET PO/SL EVERY SIX HOURS PRN NAUSEA. 08/19/12  Yes Lennis P Livesay, MD  ondansetron (ZOFRAN) 8 MG tablet Take 8 mg by mouth every 8 (eight) hours as needed. Nausea   Yes Historical Provider, MD  oxyCODONE-acetaminophen (PERCOCET/ROXICET) 5-325 MG per tablet Take 1-2 tablets by mouth  every 6 (six) hours as needed (moderate to severe pain (when tolerating fluids)). 07/31/12  Yes Antionette Char, MD  polyethylene glycol [facility] / GLYCOLAX) packet Take 17 g by mouth 2 (two) times daily as needed. constipation   Yes Historical Provider, MD    Allergies:  No Known Allergies  Social History:   reports that she has never smoked. She has never used smokeless tobacco. She reports that she does not drink alcohol or use illicit drugs.  Family History: Family History  Problem Relation Age of Onset  . Colon cancer Neg Hx   . Esophageal cancer Neg Hx   . Stomach cancer Neg Hx   . Rectal cancer Neg Hx     Physical Exam:  GEN:  Pleasant  67 year old Obese Caucasian Female examined  and in no acute distress; cooperative with exam Filed Vitals:   08/21/12 2256 08/22/12 0010 08/22/12 0030 08/22/12 0151  BP: 160/63 125/50 126/53 118/51  Pulse:  96  100  Temp:  98.7 F (37.1 C)  98.5 F (36.9 C)  TempSrc:  Oral  Oral  Resp:  16  20  Height:  5\' 3"  (1.6 m)  5\' 3"  (1.6 m)  Weight:  68.04 kg (150 lb)  69.8 kg (153 lb 14.1 oz)  SpO2:  94%  94%   Blood pressure 118/51, pulse 100, temperature 98.5 F (36.9 C), temperature source Oral, resp. rate 20, height 5\' 3"  (1.6 m), weight 69.8 kg (153 lb 14.1 oz), SpO2 94.00%. PSYCH: She is alert and oriented x4; does not appear anxious does not appear depressed; affect is normal HEENT: Normocephalic and Atraumatic, Mucous membranes pink; PERRLA; EOM intact; Fundi:  Benign;  No scleral icterus, Nares: Patent, Oropharynx: Clear, Fair Dentition, Neck:  FROM, no cervical lymphadenopathy nor thyromegaly or carotid bruit; no JVD; Breasts:: Not examined CHEST WALL: No tenderness CHEST: Normal respiration, clear to auscultation bilaterally HEART: Regular rate and rhythm; no murmurs rubs or gallops BACK: No kyphosis or scoliosis; no CVA tenderness ABDOMEN: Positive Bowel Sounds, Obese, soft non-tender; no masses, no organomegaly.   Rectal  Exam: Not done EXTREMITIES: No cyanosis, clubbing or edema; no ulcerations. Genitalia: not examined PULSES: 2+ and symmetric SKIN: Normal hydration no rash or ulceration CNS: Cranial nerves 2-12 grossly intact no focal neurologic deficit    Labs on Admission:  Basic Metabolic Panel:  Recent Labs Lab 08/21/12 2100  NA 132*  K 4.7  CL 95*  CO2 27  GLUCOSE 156*  BUN 13  CREATININE 0.82  CALCIUM 8.7   Liver Function Tests:  Recent Labs Lab 08/21/12 2100  AST 29  ALT 19  ALKPHOS 99  BILITOT 0.3  PROT 6.7  ALBUMIN 2.2*    Recent Labs Lab 08/21/12 2100  LIPASE 35   No results found for this basename: AMMONIA,  in the last 168 hours CBC:  Recent Labs Lab 08/21/12 2100  WBC 18.2*  NEUTROABS 15.3*  HGB 10.4*  HCT 31.3*  MCV 94.3  PLT 445*   Cardiac Enzymes: No results found for this  basename: CKTOTAL, CKMB, CKMBINDEX, TROPONINI,  in the last 168 hours  BNP (last 3 results) No results found for this basename: PROBNP,  in the last 8760 hours CBG:  Recent Labs Lab 08/21/12 2213  GLUCAP 131*    Radiological Exams on Admission: Dg Chest 2 View  08/21/2012  *RADIOLOGY REPORT*  Clinical Data: Shortness breath.  Chest pain.  Weakness.  CHEST - 2 VIEW  Comparison: 07/22/2012  Findings: Low lung volumes are present, causing crowding of the pulmonary vasculature.  Cardiac and mediastinal contours appear unremarkable.  This day 1.8 x 0.9 cm density at the left lung base, probably in the lingula.  The lungs appear otherwise clear.  No pleural effusion observed.  IMPRESSION:  1.  A band-like density at the left lung base is new compared to 07/22/2012, and may reflect atelectasis or less likely early pneumonia in the lingula.   Original Report Authenticated By: Gaylyn Rong, M.D.    Ct Angio Chest Pe W/cm &/or Wo Cm  08/22/2012  *RADIOLOGY REPORT*  Clinical Data: Ovarian and colon cancer.  Leukocytosis.  Elevated D- dimer.  Chest pain and shortness of breath.  CT  ANGIOGRAPHY CHEST  Technique:  Multidetector CT imaging of the chest using the standard protocol during bolus administration of intravenous contrast. Multiplanar reconstructed images including MIPs were obtained and reviewed to evaluate the vascular anatomy.  Contrast: OMNIPAQUE IOHEXOL 350 MG/ML SOLN  Comparison: 08/21/2012; 02/11/2012  Findings: No filling defect is identified in the pulmonary arterial tree to suggest pulmonary embolus.  No acute aortic findings noted.  Coronary artery atherosclerosis noted in the left anterior descending coronary artery.  Trace left pleural effusion noted.  3 mm right middle lobe subpleural nodule, image 47 of series 6.  Mild dependent atelectasis noted in both lower lobes.  Left infrahilar lymph node 1.3 cm in short axis.  Right hilar node 1.0 cm in short axis.  IMPRESSION:  1.  Negative for embolus or acute aortic findings. 2.  Coronary artery atherosclerosis. 3.  Trace left pleural effusion. 4.  3 mm right middle lobe subpleural nodule, stable from 02/10/2012, likely benign. 5.  Mild infrahilar adenopathy, particularly on the left.   Original Report Authenticated By: Gaylyn Rong, M.D.       Assessment/Plan Principal Problem:   HCAP (healthcare-associated pneumonia) Active Problems:   UTI (lower urinary tract infection)   Diabetes mellitus   Hypertension   Leukocytosis, unspecified   Acute blood loss anemia   Ovarian ca   1.  HCAP-  IV Vancomycin, and Cefepime, and Levaquin.   Nebs, O2 PRN, and Mucinex and IVFs.    2.  UTI-   Covered with levaquin and Cefepime, adjust after results of Urine C+S.    3.  DM2-  Continue SSI coverage PRN  4.  Hypertension-   Continue   5.  Leukocytosis-   Due to #1 and #2, monitor trend should improve with ABxs.  6.  Acute Blood Loss Anemia-  Post-operative, on Iron supplement.    7.  Ovarian Ca-  Had Chemo prior to Surgery, and to resume chemo  Treatments following recovery.    8.  Other-  Reconcile home  medications, and DVT prophylaxis of Lovenox.      Code Status:    FULL CODE Family Communication:    No Family present at Bedside Disposition Plan:   Return to Home on Discharge     Time spent: 61 Minutes   Ron Parker [facility] Pager [phone]  If  7PM-7AM, please contact night-coverage [email or web address] Password [facility] 08/22/2012, 2:57 AM

## 2012-08-22 NOTE — Progress Notes (Signed)
Patient seen and examined by me.  Admitted early this AM by Dr. Lovell Sheehan- please see H&P.  Marlin Canary DO

## 2012-08-23 DIAGNOSIS — D72829 Elevated white blood cell count, unspecified: Secondary | ICD-10-CM

## 2012-08-23 DIAGNOSIS — E119 Type 2 diabetes mellitus without complications: Secondary | ICD-10-CM

## 2012-08-23 DIAGNOSIS — I10 Essential (primary) hypertension: Secondary | ICD-10-CM

## 2012-08-23 LAB — BASIC METABOLIC PANEL
BUN: 7 mg/dL (ref 6–23)
CO2: 23 mEq/L (ref 19–32)
Chloride: 101 mEq/L (ref 96–112)
Glucose, Bld: 101 mg/dL — ABNORMAL HIGH (ref 70–99)
Potassium: 3.4 mEq/L — ABNORMAL LOW (ref 3.5–5.1)

## 2012-08-23 LAB — CBC
HCT: 26.5 % — ABNORMAL LOW (ref 36.0–46.0)
Hemoglobin: 8.7 g/dL — ABNORMAL LOW (ref 12.0–15.0)
MCHC: 32.8 g/dL (ref 30.0–36.0)
RBC: 2.79 MIL/uL — ABNORMAL LOW (ref 3.87–5.11)

## 2012-08-23 MED ORDER — FLUCONAZOLE 200 MG PO TABS
200.0000 mg | ORAL_TABLET | Freq: Once | ORAL | Status: AC
  Administered 2012-08-23: 200 mg via ORAL
  Filled 2012-08-23: qty 1

## 2012-08-23 MED ORDER — FLUCONAZOLE 100 MG PO TABS
100.0000 mg | ORAL_TABLET | Freq: Every day | ORAL | Status: DC
  Administered 2012-08-24: 100 mg via ORAL
  Filled 2012-08-23: qty 1

## 2012-08-23 MED ORDER — POTASSIUM CHLORIDE CRYS ER 20 MEQ PO TBCR
40.0000 meq | EXTENDED_RELEASE_TABLET | Freq: Once | ORAL | Status: AC
  Administered 2012-08-23: 40 meq via ORAL
  Filled 2012-08-23: qty 2

## 2012-08-23 MED ORDER — NYSTATIN 100000 UNIT/ML MT SUSP
5.0000 mL | Freq: Four times a day (QID) | OROMUCOSAL | Status: DC
  Administered 2012-08-23 – 2012-08-24 (×4): 500000 [IU] via ORAL
  Filled 2012-08-23 (×7): qty 5

## 2012-08-23 NOTE — Progress Notes (Signed)
PT Cancellation Note  Patient Details Name: Ann Baldwin MRN: [id number] DOB: December 17, 1945   Cancelled Treatment:    Reason Eval/Treat Not Completed: Other (comment).  Pt declined OOB at all today due to feeling sick earlier.  Will check back with pt tomorrow.   Thanks,    Vista Deck 08/23/2012, 1:02 PM

## 2012-08-23 NOTE — Progress Notes (Signed)
[facility] PROGRESS NOTE  Ann Baldwin ZOX:[id number] DOB: 08/10/1945 DOA: 08/21/2012 PCP: No primary provider on file.  Assessment/Plan: HCAP- IV Vancomycin, and Cefepime, and Levaquin. Nebs, O2 PRN, and Mucinex and IVFs.   UTI- Covered with levaquin and Cefepime, adjust after results of Urine C+S.  Thrush- dilfulcan, nystatin   DM2- Continue SSI coverage  Hypertension- Continue home meds   Leukocytosis- Due to #1 and #2, improved  Hypokalemia- replete  Acute Blood Loss Anemia- Post-operative, on Iron supplement.   Ovarian Ca- Had Chemo prior to Surgery, and to resume chemo Treatments following recovery.   Other- Reconcile home medications, and DVT prophylaxis of Lovenox   Code Status: full Family Communication: patient at bedside Disposition Plan: home when better   Consultants:  none  Procedures:    Antibiotics:  vanc/cefepime  HPI/Subjective: c/o sore throat, decreased appetite No SOB, no CP  Objective: Filed Vitals:   08/22/12 1443 08/22/12 2144 08/23/12 0216 08/23/12 0550  BP: 114/48 121/55 109/48 123/56  Pulse: 65 98 96 100  Temp: 99.1 F (37.3 C) 100.7 F (38.2 C) 99.3 F (37.4 C) 98.4 F (36.9 C)  TempSrc: Oral Oral Oral Oral  Resp: 18 18 16 18   Height:      Weight:      SpO2: 100% 97% 98% 97%    Intake/Output Summary (Last 24 hours) at 08/23/12 1116 Last data filed at 08/23/12 8119  Gross per 24 hour  Intake 1881.25 ml  Output    200 ml  Net 1681.25 ml   Filed Weights   08/22/12 0010 08/22/12 0151  Weight: 68.04 kg (150 lb) 69.8 kg (153 lb 14.1 oz)    Exam:   General:  A+Ox3, NAD  RRR  Respiratory: clear anterior  +BS, soft, NT  -c/c/e  Data Reviewed: Basic Metabolic Panel:  Recent Labs Lab 08/21/12 2100 08/22/12 0430 08/23/12 0420  NA 132* 134* 134*  K 4.7 4.1 3.4*  CL 95* 100 101  CO2 27 26 23   GLUCOSE 156* 167* 101*  BUN 13 10 7   CREATININE 0.82 0.82 0.82  CALCIUM 8.7 8.3* 8.3*   Liver  Function Tests:  Recent Labs Lab 08/21/12 2100  AST 29  ALT 19  ALKPHOS 99  BILITOT 0.3  PROT 6.7  ALBUMIN 2.2*    Recent Labs Lab 08/21/12 2100  LIPASE 35   No results found for this basename: AMMONIA,  in the last 168 hours CBC:  Recent Labs Lab 08/21/12 2100 08/22/12 0430 08/23/12 0420  WBC 18.2* 14.1* 11.2*  NEUTROABS 15.3*  --   --   HGB 10.4* 8.9* 8.7*  HCT 31.3* 27.5* 26.5*  MCV 94.3 94.8 95.0  PLT 445* 408* 389   Cardiac Enzymes: No results found for this basename: CKTOTAL, CKMB, CKMBINDEX, TROPONINI,  in the last 168 hours BNP (last 3 results) No results found for this basename: PROBNP,  in the last 8760 hours CBG:  Recent Labs Lab 08/21/12 2213  GLUCAP 131*    Recent Results (from the past 240 hour(s))  URINE CULTURE     Status: None   Collection Time    08/21/12 10:45 PM      Result Value Range Status   Specimen Description URINE, CLEAN CATCH   Final   Special Requests NONE   Final   Culture  Setup Time 08/22/2012 12:56   Final   Colony Count PENDING   Incomplete   Culture Culture reincubated for better growth   Final   Report Status  PENDING   Incomplete     Studies: Dg Chest 2 View  08/21/2012  *RADIOLOGY REPORT*  Clinical Data: Shortness breath.  Chest pain.  Weakness.  CHEST - 2 VIEW  Comparison: 07/22/2012  Findings: Low lung volumes are present, causing crowding of the pulmonary vasculature.  Cardiac and mediastinal contours appear unremarkable.  This day 1.8 x 0.9 cm density at the left lung base, probably in the lingula.  The lungs appear otherwise clear.  No pleural effusion observed.  IMPRESSION:  1.  A band-like density at the left lung base is new compared to 07/22/2012, and may reflect atelectasis or less likely early pneumonia in the lingula.   Original Report Authenticated By: Gaylyn Rong, M.D.    Ct Angio Chest Pe W/cm &/or Wo Cm  08/22/2012  *RADIOLOGY REPORT*  Clinical Data: Ovarian and colon cancer.  Leukocytosis.   Elevated D- dimer.  Chest pain and shortness of breath.  CT ANGIOGRAPHY CHEST  Technique:  Multidetector CT imaging of the chest using the standard protocol during bolus administration of intravenous contrast. Multiplanar reconstructed images including MIPs were obtained and reviewed to evaluate the vascular anatomy.  Contrast: OMNIPAQUE IOHEXOL 350 MG/ML SOLN  Comparison: 08/21/2012; 02/11/2012  Findings: No filling defect is identified in the pulmonary arterial tree to suggest pulmonary embolus.  No acute aortic findings noted.  Coronary artery atherosclerosis noted in the left anterior descending coronary artery.  Trace left pleural effusion noted.  3 mm right middle lobe subpleural nodule, image 47 of series 6.  Mild dependent atelectasis noted in both lower lobes.  Left infrahilar lymph node 1.3 cm in short axis.  Right hilar node 1.0 cm in short axis.  IMPRESSION:  1.  Negative for embolus or acute aortic findings. 2.  Coronary artery atherosclerosis. 3.  Trace left pleural effusion. 4.  3 mm right middle lobe subpleural nodule, stable from 02/10/2012, likely benign. 5.  Mild infrahilar adenopathy, particularly on the left.   Original Report Authenticated By: Gaylyn Rong, M.D.     Scheduled Meds: . ceFEPime (MAXIPIME) IV  1 g Intravenous Q8H  . enoxaparin (LOVENOX) injection  40 mg Subcutaneous Q24H  . feeding supplement  237 mL Oral TID BM  . fluconazole  200 mg Oral Once   Followed by  . [START ON 08/24/2012] fluconazole  100 mg Oral Daily  . levofloxacin (LEVAQUIN) IV  750 mg Intravenous Q24H  .  morphine injection  4 mg Intravenous Once  . nystatin  5 mL Oral QID  . vancomycin  750 mg Intravenous Q12H   Continuous Infusions:   Principal Problem:   HCAP (healthcare-associated pneumonia) Active Problems:   Diabetes mellitus   Hypertension   Ovarian ca   UTI (lower urinary tract infection)   Leukocytosis, unspecified   Acute blood loss anemia    Time spent:  25   [facility], Evann Erazo  [facility] Pager [phone]. If 8PM-8AM, please contact night-coverage at [email or web address], password [facility] 08/23/2012, 11:16 AM  LOS: 2 days

## 2012-08-23 NOTE — Progress Notes (Signed)
Gynecologic Oncology Follow Up Note  Subjective: Patient reports having no appetite but able to tolerate liquids.  Reporting intermittent throat pain after episodes of dry heaving.  Last bowel movement on Sunday as diarrhea.  Denies chest pain, abdominal pain, vaginal bleeding, dyspnea, or sputum production.   Objective: Vital signs in last 24 hours: Temp:  [98.4 F (36.9 C)-100.7 F (38.2 C)] 98.4 F (36.9 C) (02/11 0550) Pulse Rate:  [65-100] 100 (02/11 0550) Resp:  [16-18] 18 (02/11 0550) BP: (109-123)/(48-56) 123/56 mmHg (02/11 0550) SpO2:  [97 %-100 %] 97 % (02/11 0550) Last BM Date: 08/21/12  Intake/Output from previous day: 02/10 0701 - 02/11 0700 In: 1881.3 [I.V.:1281.3; IV Piggyback:600] Out: 200 [Urine:200]  Physical Examination: General: alert, cooperative and no distress Resp: lungs clear anteriorly Cardio: regular rate and rhythm, S1, S2 normal, no murmur, click, rub or gallop GI: soft, non-tender; bowel sounds normal; no masses,  no organomegaly and well healed midline incision noted Extremities: extremities normal, atraumatic, no cyanosis or edema  Labs: WBC/Hgb/Hct/Plts:  11.2/8.7/26.5/389 (02/11 0420) BUN/Cr/glu/ALT/AST/amyl/lip:  7/0.82/--/--/--/--/-- (02/11 0420)  Assessment/ Plan: 67 y.o. s/p total abdominal hysterectomy, bilateral salpingo-oophorectomy, omentectomy, and resection of the midportion of the ascending colon with ileotransverse enterocolostomy on 07/26/12. 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  The patient last saw Dr. Nelly Rout on 08/18/12 with the recommendations for a low residue diet and further chemotherapy per Dr. Darrold Span.  She was admitted on 08/21/12 with healthcare associated pneumonia and a urinary tract infection.   Continue plan of care per [facility].  Dr. Nelly Rout made aware of this admission.  Plan to follow up with the patient after discharge from the hospital.         LOS: 2 days    CROSS, MELISSA DEAL 08/23/2012, 12:39 PM

## 2012-08-24 DIAGNOSIS — N390 Urinary tract infection, site not specified: Principal | ICD-10-CM

## 2012-08-24 DIAGNOSIS — D62 Acute posthemorrhagic anemia: Secondary | ICD-10-CM

## 2012-08-24 DIAGNOSIS — C569 Malignant neoplasm of unspecified ovary: Secondary | ICD-10-CM

## 2012-08-24 DIAGNOSIS — J189 Pneumonia, unspecified organism: Secondary | ICD-10-CM

## 2012-08-24 LAB — URINE CULTURE

## 2012-08-24 MED ORDER — POTASSIUM CHLORIDE CRYS ER 20 MEQ PO TBCR
40.0000 meq | EXTENDED_RELEASE_TABLET | Freq: Once | ORAL | Status: AC
  Administered 2012-08-24: 40 meq via ORAL
  Filled 2012-08-24: qty 2

## 2012-08-24 MED ORDER — NYSTATIN 100000 UNIT/ML MT SUSP: 5.0000 mL | Freq: Four times a day (QID) | OROMUCOSAL | Status: DC

## 2012-08-24 MED ORDER — LEVOFLOXACIN 750 MG PO TABS: 750.0000 mg | ORAL_TABLET | Freq: Every day | ORAL | Status: DC

## 2012-08-24 MED ORDER — GLUCERNA SHAKE PO LIQD: 237.0000 mL | Freq: Three times a day (TID) | ORAL | Status: DC

## 2012-08-24 NOTE — Discharge Summary (Signed)
Physician Discharge Summary  Ann Baldwin HQI:[id number] DOB: October 01, 1945 DOA: 08/21/2012  PCP: No primary provider on file.  Admit date: 08/21/2012 Discharge date: 08/24/2012  Time spent: 40 minutes  Recommendations for Outpatient Follow-up:  1. Home with patient's PCP and oncology follow up  Discharge Diagnoses:  Principal Problem:   HCAP (healthcare-associated pneumonia)  Active Problems:   Diabetes mellitus   Hypertension   Ovarian ca   Postoperative anemia due to acute blood loss   UTI (lower urinary tract infection)   Leukocytosis, unspecified Hypokalemia   Discharge Condition: Fair  Diet recommendation: Diabetic  Filed Weights   08/22/12 0010 08/22/12 0151  Weight: 68.04 kg (150 lb) 69.8 kg (153 lb 14.1 oz)    History of present illness:  Please refer to admission H&P for details but in brief, 67 y.o. female who presents to the ED with complaints of fevers, chills, and cough for the past 3 weeks since her hysterectomy for Ovarian Cancer on 07/26/2012. Her cough has been nonproductive. She has also had nausea and vomiting but denies diarrhea. She reports having intake of foods and liquids and feels that her symptoms have been worsening. In the ED , she was found to have a Left lingular pneumonia on chest X-Ray, and a UTI. She was placed on antibiotic therapy to cover both HCAP and the UTI with Vancomycin, Cefepime, and Levaquin. She was referred for admission.    Hospital Course:  Healthcare associated pneumonia Patient presented with subjective fever and chills with chest x-ray concerning for a possible infiltrate. She also had an elevated d-dimer for which a CT angio of chest was done negative for PE. Patient started on broad antibiotic for healthcare associated pneumonia with IV vancomycin, cefepime and Levaquin. Patient remained afebrile in the hospital. She had leukocytosis which has now improved towards normal. She denies any shortness of breath or chills at this  time. Blood cultures were not ordered on admission. She is clinically stable and I will discharge her on oral levofloxacin for next 4 days to complete a seven-day course. She will followup with her PCP and at GYN oncology as outpatient  UTI UA was suggestive of UTI on presentation. She however did not have any symptoms. Urine culture was negative. She is being discharged on Levaquin for pneumonia which will cover for the UTI as well  Oral thrush Noted on presentation and started on fluconazole and nystatin. She does not have any first at present. I will discharge her on a course of nystatin for next few days  Hypokalemia Being replenished  Anemia Likely blood loss from recent postop. On iron supplements. Hemoglobin to baseline and should be followed as outpatient.   Procedures:  None  Consultations: gyn Oncology  Discharge Exam: Filed Vitals:   08/24/12 0254 08/24/12 0535 08/24/12 0735 08/24/12 0948  BP: 118/63 124/59 122/56 148/67  Pulse: 79 79 79 89  Temp: 98.2 F (36.8 C) 98.1 F (36.7 C) 98.1 F (36.7 C) 97.9 F (36.6 C)  TempSrc: Oral Oral Oral Oral  Resp: 16 18    Height:      Weight:      SpO2: 100% 100% 99% 96%    General: elderly female in NAD HEENT: No pallor, no thrush Cardiovascular: Normal S1 and S2, no murmurs rub or gallop Respiratory: Her to auscultation bilaterally, no added sounds Abdomen: Soft, midline laboratory scar intact, nontender nondistended, bowel sounds present Extremities: Warm, no edema CNS: AAO x3  Discharge Instructions   Future Appointments Provider Department  Dept Phone   09/05/2012 9:30 AM Krista Blue [facility] MEDICAL ONCOLOGY [phone]   09/05/2012 10:00 AM Reece Packer, MD [facility] MEDICAL ONCOLOGY [phone]   09/15/2012 12:45 PM Laurette Schimke, MD PHD [facility] GYNECOLOGICAL ONCOLOGY [phone]       Medication List    STOP taking these medications        insulin regular 100 units/mL injection  Commonly known as:  NOVOLIN R,HUMULIN R     LORazepam 0.5 MG tablet  Commonly known as:  ATIVAN     oxyCODONE-acetaminophen 5-325 MG per tablet  Commonly known as:  PERCOCET/ROXICET      TAKE these medications       aspirin 81 MG tablet  Take 81 mg by mouth every morning.     atorvastatin 40 MG tablet  Commonly known as:  LIPITOR  Take 40 mg by mouth at bedtime.     docusate sodium 100 MG capsule  Commonly known as:  COLACE  Take 100 mg by mouth at bedtime.     ezetimibe 10 MG tablet  Commonly known as:  ZETIA  Take 10 mg by mouth every morning.     feeding supplement Liqd  Take 237 mLs by mouth 3 (three) times daily between meals.     HYDROcodone-acetaminophen 5-325 MG per tablet  Commonly known as:  NORCO/VICODIN  Take 1 tablet by mouth every 8 (eight) hours as needed for pain (pain).     iron polysaccharides 150 MG capsule  Commonly known as:  NIFEREX  Take 150 mg by mouth every evening.     levofloxacin 750 MG tablet  Commonly known as:  LEVAQUIN  Take 1 tablet (750 mg total) by mouth daily.     nystatin 100000 UNIT/ML suspension  Commonly known as:  MYCOSTATIN  Take 5 mLs (500,000 Units total) by mouth 4 (four) times daily.     ondansetron 8 MG tablet  Commonly known as:  ZOFRAN  Take 8 mg by mouth every 8 (eight) hours as needed. Nausea     polyethylene glycol packet  Commonly known as:  MIRALAX / GLYCOLAX  Take 17 g by mouth 2 (two) times daily as needed. constipation           Follow-up Information   Follow up with Laurette Schimke, MD PHD In 2 weeks. (PCP is Ivin Booty long in [employer] at [employer] , [place] whom she will follow up in 1 week)    Contact information:   [address] [place] [address] [phone]        The results of significant diagnostics from this hospitalization (including imaging, microbiology, ancillary and laboratory) are listed below for reference.    Significant  Diagnostic Studies: Dg Chest 2 View  08/21/2012  *RADIOLOGY REPORT*  Clinical Data: Shortness breath.  Chest pain.  Weakness.  CHEST - 2 VIEW  Comparison: 07/22/2012  Findings: Low lung volumes are present, causing crowding of the pulmonary vasculature.  Cardiac and mediastinal contours appear unremarkable.  This day 1.8 x 0.9 cm density at the left lung base, probably in the lingula.  The lungs appear otherwise clear.  No pleural effusion observed.  IMPRESSION:  1.  A band-like density at the left lung base is new compared to 07/22/2012, and may reflect atelectasis or less likely early pneumonia in the lingula.   Original Report Authenticated By: Gaylyn Rong, M.D.    Ct Angio Chest Pe W/cm &/or Wo  Cm  08/22/2012  *RADIOLOGY REPORT*  Clinical Data: Ovarian and colon cancer.  Leukocytosis.  Elevated D- dimer.  Chest pain and shortness of breath.  CT ANGIOGRAPHY CHEST  Technique:  Multidetector CT imaging of the chest using the standard protocol during bolus administration of intravenous contrast. Multiplanar reconstructed images including MIPs were obtained and reviewed to evaluate the vascular anatomy.  Contrast: OMNIPAQUE IOHEXOL 350 MG/ML SOLN  Comparison: 08/21/2012; 02/11/2012  Findings: No filling defect is identified in the pulmonary arterial tree to suggest pulmonary embolus.  No acute aortic findings noted.  Coronary artery atherosclerosis noted in the left anterior descending coronary artery.  Trace left pleural effusion noted.  3 mm right middle lobe subpleural nodule, image 47 of series 6.  Mild dependent atelectasis noted in both lower lobes.  Left infrahilar lymph node 1.3 cm in short axis.  Right hilar node 1.0 cm in short axis.  IMPRESSION:  1.  Negative for embolus or acute aortic findings. 2.  Coronary artery atherosclerosis. 3.  Trace left pleural effusion. 4.  3 mm right middle lobe subpleural nodule, stable from 02/10/2012, likely benign. 5.  Mild infrahilar adenopathy,  particularly on the left.   Original Report Authenticated By: Gaylyn Rong, M.D.     Microbiology: Recent Results (from the past 240 hour(s))  URINE CULTURE     Status: None   Collection Time    08/21/12 10:45 PM      Result Value Range Status   Specimen Description URINE, CLEAN CATCH   Final   Special Requests NONE   Final   Culture  Setup Time 08/22/2012 12:56   Final   Colony Count 80,000 COLONIES/ML   Final   Culture     Final   Value: Multiple bacterial morphotypes present, none predominant. Suggest appropriate recollection if clinically indicated.   Report Status 08/24/2012 FINAL   Final     Labs: Basic Metabolic Panel:  Recent Labs Lab 08/21/12 2100 08/22/12 0430 08/23/12 0420  NA 132* 134* 134*  K 4.7 4.1 3.4*  CL 95* 100 101  CO2 27 26 23   GLUCOSE 156* 167* 101*  BUN 13 10 7   CREATININE 0.82 0.82 0.82  CALCIUM 8.7 8.3* 8.3*   Liver Function Tests:  Recent Labs Lab 08/21/12 2100  AST 29  ALT 19  ALKPHOS 99  BILITOT 0.3  PROT 6.7  ALBUMIN 2.2*    Recent Labs Lab 08/21/12 2100  LIPASE 35   No results found for this basename: AMMONIA,  in the last 168 hours CBC:  Recent Labs Lab 08/21/12 2100 08/22/12 0430 08/23/12 0420  WBC 18.2* 14.1* 11.2*  NEUTROABS 15.3*  --   --   HGB 10.4* 8.9* 8.7*  HCT 31.3* 27.5* 26.5*  MCV 94.3 94.8 95.0  PLT 445* 408* 389   Cardiac Enzymes: No results found for this basename: CKTOTAL, CKMB, CKMBINDEX, TROPONINI,  in the last 168 hours BNP: BNP (last 3 results) No results found for this basename: PROBNP,  in the last 8760 hours CBG:  Recent Labs Lab 08/21/12 2213 08/24/12 0730  GLUCAP 131* 117*       Signed:  Jahson Emanuele  [facility] 08/24/2012, 9:51 AM

## 2012-08-24 NOTE — Progress Notes (Signed)
PT Cancellation Note and D/C from acute PT  Patient Details Name: Ann Baldwin MRN: [id number] DOB: 1945/11/25   Cancelled Treatment:    Reason Eval/Treat Not Completed: Other (comment) (screen) Order received and chart reviewed.  Pt reports she is d/c home today and does not feel she needs PT at this time.  Pt reports she has been up to bathroom multiple times without difficulty and feels she can manage well at home.  Pt reports no equipment needs.  PT to sign off.   Terry Bolotin,KATHrine E 08/24/2012, 9:53 AM Pager: [phone]

## 2012-08-24 NOTE — ED Provider Notes (Signed)
  Medical screening examination/treatment/procedure(s) were performed by non-physician practitioner and as supervising physician I was immediately available for consultation/collaboration.  On my exam the patient was in no distress.  Though she was in no distress, she was clearly ill from her recent surgery, with abnormal vital signs.   The presentation is likely multifactorial in etiology, with strong suspicion of PNA.  Though the d dimer was elevated, and warrants further consideration, there is no e/o respiratory distress / compromise on admission.  I saw the ECG (if appropriate), relevant labs and studies - I agree with the interpretation.    Gerhard Munch, MD 08/24/12 1128

## 2012-08-26 ENCOUNTER — Telehealth: Payer: Self-pay | Admitting: Gynecologic Oncology

## 2012-08-26 NOTE — Telephone Encounter (Signed)
Patient's granddaughter reporting that the patient is doing much better.  She has increased her physical activity and is taking in more liquids and solids. Instructed to please inform the patient to call for any questions or concerns.

## 2012-09-02 ENCOUNTER — Telehealth: Payer: Self-pay | Admitting: Oncology

## 2012-09-02 NOTE — Telephone Encounter (Signed)
pt daughter-in-law r/s pt appt .Marland KitchenMarland KitchenMarland KitchenDone

## 2012-09-04 ENCOUNTER — Other Ambulatory Visit: Payer: Self-pay | Admitting: Oncology

## 2012-09-05 ENCOUNTER — Other Ambulatory Visit: Payer: Medicare Other | Admitting: Lab

## 2012-09-05 ENCOUNTER — Ambulatory Visit: Payer: Medicare Other | Admitting: Oncology

## 2012-09-11 ENCOUNTER — Other Ambulatory Visit: Payer: Self-pay | Admitting: Oncology

## 2012-09-14 ENCOUNTER — Other Ambulatory Visit (HOSPITAL_BASED_OUTPATIENT_CLINIC_OR_DEPARTMENT_OTHER): Payer: Medicare Other | Admitting: Lab

## 2012-09-14 ENCOUNTER — Telehealth: Payer: Self-pay | Admitting: Oncology

## 2012-09-14 ENCOUNTER — Ambulatory Visit (HOSPITAL_BASED_OUTPATIENT_CLINIC_OR_DEPARTMENT_OTHER): Payer: Medicare Other | Admitting: Oncology

## 2012-09-14 VITALS — BP 162/90 | HR 99 | Temp 98.0°F | Resp 20 | Ht 63.0 in | Wt 154.1 lb

## 2012-09-14 DIAGNOSIS — C569 Malignant neoplasm of unspecified ovary: Secondary | ICD-10-CM

## 2012-09-14 DIAGNOSIS — I10 Essential (primary) hypertension: Secondary | ICD-10-CM

## 2012-09-14 DIAGNOSIS — E119 Type 2 diabetes mellitus without complications: Secondary | ICD-10-CM

## 2012-09-14 DIAGNOSIS — C182 Malignant neoplasm of ascending colon: Secondary | ICD-10-CM

## 2012-09-14 DIAGNOSIS — C189 Malignant neoplasm of colon, unspecified: Secondary | ICD-10-CM

## 2012-09-14 LAB — CBC WITH DIFFERENTIAL/PLATELET
Basophils Absolute: 0 10*3/uL (ref 0.0–0.1)
Eosinophils Absolute: 0.1 10*3/uL (ref 0.0–0.5)
HGB: 12.1 g/dL (ref 11.6–15.9)
LYMPH%: 25.5 % (ref 14.0–49.7)
MCV: 94.4 fL (ref 79.5–101.0)
MONO#: 0.4 10*3/uL (ref 0.1–0.9)
MONO%: 8 % (ref 0.0–14.0)
NEUT#: 3.3 10*3/uL (ref 1.5–6.5)
Platelets: 240 10*3/uL (ref 145–400)
WBC: 5.1 10*3/uL (ref 3.9–10.3)

## 2012-09-14 LAB — COMPREHENSIVE METABOLIC PANEL (CC13)
ALT: 19 U/L (ref 0–55)
CO2: 25 mEq/L (ref 22–29)
Calcium: 10 mg/dL (ref 8.4–10.4)
Chloride: 104 mEq/L (ref 98–107)
Creatinine: 0.8 mg/dL (ref 0.6–1.1)
Glucose: 110 mg/dl — ABNORMAL HIGH (ref 70–99)
Sodium: 141 mEq/L (ref 136–145)
Total Bilirubin: 0.33 mg/dL (ref 0.20–1.20)
Total Protein: 7.2 g/dL (ref 6.4–8.3)

## 2012-09-14 LAB — CA 125: CA 125: 17.4 U/mL (ref 0.0–30.2)

## 2012-09-14 NOTE — Patient Instructions (Signed)
We will call in prescriptions.  Portacath by interventional radiology before chemo starts ~ March 12

## 2012-09-14 NOTE — Telephone Encounter (Signed)
gv and printed appt schedule for march...lvm for Tina in IR for port placement...emailed michelle to add tx

## 2012-09-15 ENCOUNTER — Other Ambulatory Visit: Payer: Self-pay

## 2012-09-15 ENCOUNTER — Ambulatory Visit: Payer: Medicare Other | Attending: Gynecologic Oncology | Admitting: Gynecologic Oncology

## 2012-09-15 ENCOUNTER — Encounter: Payer: Self-pay | Admitting: Gynecologic Oncology

## 2012-09-15 ENCOUNTER — Telehealth: Payer: Self-pay | Admitting: *Deleted

## 2012-09-15 VITALS — BP 142/80 | HR 64 | Temp 98.3°F | Resp 16 | Ht 66.0 in | Wt 154.2 lb

## 2012-09-15 DIAGNOSIS — C569 Malignant neoplasm of unspecified ovary: Secondary | ICD-10-CM

## 2012-09-15 DIAGNOSIS — C189 Malignant neoplasm of colon, unspecified: Secondary | ICD-10-CM | POA: Insufficient documentation

## 2012-09-15 DIAGNOSIS — C786 Secondary malignant neoplasm of retroperitoneum and peritoneum: Secondary | ICD-10-CM | POA: Insufficient documentation

## 2012-09-15 MED ORDER — LORAZEPAM 0.5 MG PO TABS: ORAL_TABLET | ORAL | Status: DC

## 2012-09-15 MED ORDER — HYDROCODONE-ACETAMINOPHEN 5-325 MG PO TABS: 1.0000 | ORAL_TABLET | Freq: Four times a day (QID) | ORAL | Status: DC | PRN

## 2012-09-15 MED ORDER — DEXAMETHASONE 4 MG PO TABS: ORAL_TABLET | ORAL | Status: DC

## 2012-09-15 MED ORDER — ONDANSETRON HCL 8 MG PO TABS: 8.0000 mg | ORAL_TABLET | Freq: Three times a day (TID) | ORAL | Status: DC | PRN

## 2012-09-15 NOTE — Telephone Encounter (Signed)
Per staff message and POF I have scheduled appts.  JMW  

## 2012-09-15 NOTE — Progress Notes (Signed)
Office Visit Note: Gyn-Onc    Ann Baldwin 67 y.o. female  CC:  Chief Complaint  Patient presents with  . Ovarian Cancer    Follow up  . Colon Cancer  Post op check  HPI: 67 y.o.  y/o G2P2 LNMP 40's  presented in April 2013 with abdominal discomfort, diarrhea, nausea, no constipation  that was thought to be IBD. The symptoms persisted and she was referred for colonoscopy 01/26/2012.  Findings were notable for  A large ulcer with heaped up margins in the ascending colon.  It was difficult to see and to obtain biopsies.    Pathology was c/w adenocarcinoma and the concern that it was not a primary colonic adenocarcinoma.    CT abdomen and pelvis 02/01/2012 with a 5x6.2 cmcomplex solid and cystic left adnexal mass, 3.9x2.9 right ovarian soft tissue mass, omental caking and peritoneal carcinomatosis.    The initial CA 125 returned > 22,000 CEA wnl, immuno stains of the biopsy were not c/w colon cancer.  Cr was > 8 and the patient was admitted with acute renal failure. Cytology from paracentesis was c/w Gyn primary. She has received 6 cycles of dose dense Taxol/carboplatin. Cycle 6was administered on 06/23/2012. CA 125 34.7 06/2012  A CT scan of the abdomen and pelvis collected on 05/12/2012 IMPRESSION:  1. Interval response to therapy. There has been near complete resolution of abdominal ascites and peritoneal nodularity. 2. Interval decrease in the size of bilateral adnexal cystic lesions. 3. Small soft tissue attenuating nodule arising from the uterine fundus is unchanged in size from previous exam and is favored to represent a subserosal (exophytic) fibroid. 4. No new or progressive disease identified. 5. Small subpleural nodule in the right middle lobe is stable from previous exam.   In July 26 2012 she underwent total abdominal hysterectomy bilateral salpingo-oophorectomy omentectomy resection of the midportion of the ascending colon with ileotransverse enterocolostomy. 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  Postoperatively she was diagnosed with pneumonia and a UTI.  Feels much better today.  Past Surgical Hx:  Past Surgical History  Procedure Laterality Date  . No past surgeries    . Laparotomy  07/26/2012    Procedure: EXPLORATORY LAPAROTOMY;  Surgeon: Laurette Schimke, MD PHD;  Location: [facility];  Service: Gynecology;  Laterality: N/A;  Exploratory Laporatomy/Total Abdominal Hysterectomy Bilatderal Salpingo Oopherectomy/Tumor Debulking  . Abdominal hysterectomy  07/26/2012    Procedure: HYSTERECTOMY ABDOMINAL;  Surgeon: Laurette Schimke, MD PHD;  Location: [facility];  Service: Gynecology;;  . Salpingoophorectomy  07/26/2012    Procedure: SALPINGO OOPHORECTOMY;  Surgeon: Laurette Schimke, MD PHD;  Location: [facility];  Service: Gynecology;  Laterality: Bilateral;  . Omentectomy  07/26/2012    Procedure: OMENTECTOMY;  Surgeon: Laurette Schimke, MD PHD;  Location: [facility];  Service: Gynecology;;  . Colostomy revision  07/26/2012    Procedure: COLON RESECTION RIGHT;  Surgeon: Laurette Schimke, MD PHD;  Location: [facility];  Service: Gynecology;;  with reanastomsis    Past Medical Hx:  Past Medical History  Diagnosis Date  . Diabetes mellitus   . Hyperlipidemia   . Ovarian cancer   . Hypertension     no current meds for since aug 2013  . Anemia   . Colon cancer   Last mammogram 2 years  ago wnl.  Past Gynecological History: G2 P2 LNMP in 40's.  Menarche 12 regular menses until menopause.  No contraceptives used.  No h/o abnormal pap test, Last pap 10 years ago.  NSVD x 2.  No LMP recorded. Patient has had a hysterectomy.  Family Hx:  Family History  Problem Relation Age of Onset  . Colon cancer Neg Hx   . Esophageal cancer Neg Hx   . Stomach cancer  Neg Hx   . Rectal cancer Neg Hx     Review of Systems:  Constitutional  Feels very tired but much improved last visit.  Appetite has improved Cardiovascular  No chest pain, shortness of breath,  Pulmonary  No cough or wheeze.  Gastro Intestinal  Denies nausea,  No bright red blood per rectum.  No constipation, no diarrhea,   Genito Urinary  No vaginal bleeding or discharge ID  no fever or chills  Vitals:  Blood pressure 142/80, pulse 64, temperature 98.3 F (36.8 C), temperature source Oral, resp. rate 16, height 5\' 6"  (1.676 m), weight 154 lb 3.2 oz (69.945 kg).  Physical Exam: Thin WD in NAD. Appears exhausted Chest: Clear to auscultation Heart: Regular rate and rhythm normal S1-S2 Abdomen: Soft nontender no rebound or guarding.  Incision clean dry and intact.  Normal BS. Back: no CVA tenderness Pelvic: Normal external genitalia Bartholin's urethra and Skene's glands. Vagina cuff intact no nodularity no vaginal discharge or bleeding Extremities: 1-2+ lower extremity edema bilaterally   Assessment/Plan:  [name]  is a 67 y.o.  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  She underwent interval debulking 07/26/2012  and findings were consistent with a synchronous ovarian and ascending colon malignancy. Molecular testing was performed without any evidence of loss of expression of MSH6, PMS2 MLH1 and MSH2.   Dr. Darrold Span has assessed the patient and ordered a Port-A-Cath.  I am aware that she will receive 3 additional cycles of chemotherapy directed at the primary ovarian cancer.   Followup with GYN oncology in 3 months.  Laurette Schimke, MD, PhD 09/15/2012, 1:22 PM

## 2012-09-15 NOTE — Patient Instructions (Signed)
Followup with GYN oncology in 3 months

## 2012-09-16 ENCOUNTER — Encounter: Payer: Self-pay | Admitting: Oncology

## 2012-09-16 NOTE — Progress Notes (Signed)
OFFICE PROGRESS NOTE   09/14/2012  Physicians:W.Brewster, C.Leone Payor, PCP @ [facility]   INTERVAL HISTORY:   Patient is seen, together with granddaughter, having had exploratory laparotomy with total hysterectomy, bilateral salpingo-oophorectomy, infragastric omentectomy, and partial resection of the ascending colon with ileotransverse loop enterocolostomy by Dr Nelly Rout at [facility] on 07-26-12. The surgery was complete debulking of the gyn cancer, but unanticipated diagnosis of a separate node positive colon cancer. Patient has been recovering from the surgery and is here for discussion of additional chemotherapy for the gyn cancer and possible adjuvant chemotherapy for the stage III colon cancer. Peripheral IV access is difficult now, and she would like PAC for further chemotherapy. Significant comorbidities include diabetes and the multifactorial acute renal failure shortly after diagnosis.  Patient presented with GI symptoms thought IBD in April 2013. She had colonoscopy by Dr Stan Head 01-26-12, with large ulcer in ascending colon, pathology [phone] found adenocarcinoma consistent with noncolonic primary. CT AP (with contrast) in [facility] system 02-02-12 had complex solid and cystic left adnexal mass, right ovarian mass, omental caking and peritoneal carcinomatosis. She was seen by Dr Nelly Rout on 02-09-12, with CA 125 of 22,938 and CEA 0.5; CMET resulted after Dr Forrestine Him visit had BUN 63 and creatinine 8.78. She was admitted to [facility] 7-31 thru 02-21-12 with acute renal failure felt multifactorial including prerenal with N/V/diarrhea/ascites, NSAID and ACE inhibitors, lasix, IV contrast, HTN and DM. Renal function progressively improved, allowing addition of careful carboplatin to taxol beginning cycle 3 chemotherapy. She had paracentesis of 5 liters on 02-11-12 and 2.4 liters on 02-18-12. CA 125, which was 22,938 at presentation, was down to 1835 prior to cycle 4 (lab done 04-04-12) and is  409 by lab resulting after visit today. She had first 2 cycles of weekly taxol as single agent following the renal problems on presentation, first cycle started on 02-24-12, tolerated adequately tho blood sugars elevate significantly with her diabetes and the steroids. She had carboplatin added day 1 beginning cycle 3 (04-07-12). Last chemotherapy was 06-23-12 she did not require gCSF with the neoadjuvant treatment. Surgery by Dr Nelly Rout was 07-26-12, complete debulking of left ovarian primary and also right hemicolectomy with reanastomosis and 2 lymph node evaluation for the synchronous primary colon cancer. Last CT AP was 05-12-12; there is no mention of anything of concern with liver at time of surgery. She was transfused 2 units PRBCs after surgery.  Patient has been progressively improving since surgery, with abdomen feeling the best now that it has been. Bowels moved well today, using dulcolax and miralax. She has needed at most 2 of her pain pills daily. She has not been on lovenox since DC home. She tires with minimal exertion, but improves quickly with rest and does not want to be inactive. She is not SOB at rest, no chest pain. No fever or symptoms of infection. Appetite is fairly good and blood sugars ~ 120 as long as she avoids sweets. She has had no swelling LE. No bleeding. Voiding ok. Peripheral IV access is too difficult now and she agrees to [facility] by IR.lleft 4th finger unable to extend, feels slightly numb, no trauma Remainder of 10 point Review of Systems negative.  Objective:  Vital signs in last 24 hours:  BP 162/90  Pulse 99  Temp(Src) 98 F (36.7 C) (Oral)  Resp 20  Ht 5\' 3"  (1.6 m)  Wt 154 lb 1.6 oz (69.899 kg)  BMI 27.3 kg/m2  Weight is down ~ 4 lbs from  prior to surgery. Alert, looks somewhat fatigued but NAD, uses WC for length of office hall. Respirations not labored with exertion in exam room. Granddaughter very supportive as always.  HEENT:PERRLA, sclera clear,  anicteric and oropharynx clear, no lesions Hair is growing back.  LymphaticsCervical, supraclavicular, and axillary nodes normal. Resp: clear to auscultation bilaterally and normal percussion bilaterally Cardio: regular rate and rhythm GI: midline incision closed, not tender, no erythema Extremities: extremities normal, atraumatic, no cyanosis or edema, no cords or tenderness. Left 4th finger does not extend fully, decreased sensation there. Neuro:nonfocal Skin without rash or ecchymosis  Lab Results:  Results for orders placed in visit on 09/14/12  CEA      Result Value Range   CEA 1.9  0.0 - 5.0 ng/mL  CBC WITH DIFFERENTIAL      Result Value Range   WBC 5.1  3.9 - 10.3 10e3/uL   NEUT# 3.3  1.5 - 6.5 10e3/uL   HGB 12.1  11.6 - 15.9 g/dL   HCT 16.1  09.6 - 04.5 %   Platelets 240  145 - 400 10e3/uL   MCV 94.4  79.5 - 101.0 fL   MCH 31.9  25.1 - 34.0 pg   MCHC 33.8  31.5 - 36.0 g/dL   RBC 4.09  8.11 - 9.14 10e6/uL   RDW 18.0 (*) 11.2 - 14.5 %   lymph# 1.3  0.9 - 3.3 10e3/uL   MONO# 0.4  0.1 - 0.9 10e3/uL   Eosinophils Absolute 0.1  0.0 - 0.5 10e3/uL   Basophils Absolute 0.0  0.0 - 0.1 10e3/uL   NEUT% 64.6  38.4 - 76.8 %   LYMPH% 25.5  14.0 - 49.7 %   MONO% 8.0  0.0 - 14.0 %   EOS% 1.4  0.0 - 7.0 %   BASO% 0.5  0.0 - 2.0 %  CA 125      Result Value Range   CA 125 17.4  0.0 - 30.2 U/mL  COMPREHENSIVE METABOLIC PANEL (CC13)      Result Value Range   Sodium 141  136 - 145 mEq/L   Potassium 3.8  3.5 - 5.1 mEq/L   Chloride 104  98 - 107 mEq/L   CO2 25  22 - 29 mEq/L   Glucose 110 (*) 70 - 99 mg/dl   BUN 78.2  7.0 - 95.6 mg/dL   Creatinine 0.8  0.6 - 1.1 mg/dL   Total Bilirubin 2.13  0.20 - 1.20 mg/dL   Alkaline Phosphatase 75  40 - 150 U/L   AST 16  5 - 34 U/L   ALT 19  0 - 55 U/L   Total Protein 7.2  6.4 - 8.3 g/dL   Albumin 3.7  3.5 - 5.0 g/dL   Calcium 08.6  8.4 - 57.8 mg/dL      PATHOLOGY  Accession #: [id number]  Date: 07/26/2012  REPORT OF SURGICAL  PATHOLOGY ADDITIONAL INFORMATION: 3. The tumor block of ascending colon specimen was sent to [facility] for molecular testing. IHC report: No loss of expression of MSH6, PMS2, MLH-1, and MSH-2. [facility] number: [id number]. Abigail Miyamoto MD Pathologist, Electronic Signature ( Signed 08/03/2012) FINAL DIAGNOSIS Diagnosis 1. 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 PLEASE SEE ONCOLOGY TEMPLATE FOR DETAILS. - ENDOMETRIUM: BENIGN ENDOMETRIAL POLYP AND ADJACENT BENIGN ATROPHIC ENDOMETRIUM, NO ATYPIA, HYPERPLASIA OR MALIGNANCY. - CERVIX: BENIGN SQUAMOUS MUCOSA AND ENDOCERVICAL MUCOSA, NO DYSPLASIA OR  MALIGNANCY. - UTERINE SEROSA: INVOLVEMENT WITH HIGH GRADE SEROUS CARCINOMA AND ASSOCIATED PSAMMOMA BODIES. 2. 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 3. Colon Ascending , right - INVASIVE MODERATELY DIFFERENTIATED ADENOCARCINOMA, INVADING THROUGH THE MUSCULARIS PROPRIA AND INVOLVING THE INKED SEROSA. - NO ANGIOLYMPHATIC INVASION IDENTIFIED. - ONE OF TWO LYMPH NODES, POSITIVE FOR METASTATIC CARCINOMA (1/2).  Microscopic Comment 1. OVARY Specimen(s): Uterus, cervix, bilateral ovaries and fallopian tubes. Procedure(s): Total hysterectomy and bilateral salpingo-oophorectomy Lymph node sampling performed: No Primary tumor site: Bilateral ovaries, residual tumor nodules measuring 0.5 cm. Ovarian surface involvement: Present Maximum tumor size (cm): 0.5 cm Histologic type: Serous carcinoma Grade: High grade Peritoneal implants: Present, 1.8 cm Pelvic extension: N/A Peritoneal washings: Positive ([id number]) Lymph nodes: number examined N/A; number positive N/A TNM code: ypT3b, ypNX FIGO Stage (based on pathologic findings, needs clinical correlation): IIIB. Comments: Sections of  the bilateral ovaries show tumor nodules in the ovarian cortex, measuring up to 0.5 cm. The tumor cells display high grade cytology with prominent nucleoli associated with abundant psammoma bodies. The ovarian serosa, fallopian tubal serosa and uterine serosa are extensively involved with high grade serous carcinoma with associated abundant psammoma bodies. Sections of the endometrium show benign endometrial polyps and adjacent benign atrophic endometrium with no evidence of hyperplasia or malignancy. The separately submitted omentum is serially sectioned and the cut surfaces reveal a 1.8 cmm nodule. Microscopically the nodule is composed of high grade serous carcinoma with associated chronic inflammation and abundant psammoma bodies. Immunohistochemical stains were performed on the section taken from the ovary (block 1T). The high grade serous carcinoma is strongly and diffusely positive for cytokeratin 7, WT-1 and weakly positive for ER, negative for colon primary marker cytokeratin 20 and CDX-2 with appropriate control. The morphologic features and immunophenotypes are different from the tumor seen in the right ascending colon. Please correlate with part 3 comment for details. The case was discussed with Dr. Nelly Rout and Telford Nab on 07/28/2012. Drs. Luisa Hart and Smir have reviewed this case and agree with the above interpretation. 3. COLON AND RECTUM Specimen: Right ascending colon Procedure: Short segmental resection Tumor site: Right ascending colon Specimen integrity: Intact Macroscopic intactness of mesorectum: N/A Macroscopic tumor perforation: No Invasive tumor: Maximum size: 3.0 cm Histologic type(s): Invasive adenocarcinoma Histologic grade and differentiation: G2: moderately differentiated/low grade Microscopic extension of invasive tumor: Invading through the muscularis propria, involving the inked serosa Lymph-Vascular invasion: Not identified Peri-neural invasion: Not  identified Tumor deposit(s) (discontinuous extramural extension): N/A Resection margins: Closest margin 2.0 cm, the opposite margin is 3.5 cm; Non-peritoneal radial margin: 2 cm Treatment effect (neoadjuvant therapy): Present, mild changes Number of lymph nodes examined 2; Number positive 1 Additional polyp(s): N/A Non-neoplastic findings: N/A Pathologic Staging: pT4a, pN1a, pMX 2 of 4 FINAL for SIMRIN, VEGH ([id number]) Microscopic Comment(continued) Ancillary studies: A paraffin block will be sent for MSI testing and an addendum report will follow. Comment: There is an invasive moderately differentiated adenocarcinoma invading through the muscularis propria and involving the inked visceral serosa. The morphologic features of this cancer are different from those seen in the bilateral adnexal tumors and omentum tumor deposits. Immunohistochemical stains were performed and the colon tumor cells are patchy positive for CDX-2, negative for cytokeratin 7, 20, WT-1 and ER. The overall morphologic features and the immunophenotypes are different from those seen in the bilateral adnexal tumors and mostly consistent with a colonic primary.   Studies/Results:  No results  found.  Medications: I have reviewed the patient's current medications.  Per Dr Forrestine Him note of 08-18-12, if it were not for the colon cancer, recommendation would be for 3 additional cycles of chemotherapy for the gyn cancer, which is high grade serous and certainly significant risk for recurrence. The T4a colon cancer has one node involved of only 2 examined, 2 cm margins, moderate differentiation, no LVSI or perineural invasion, such that adjuvant chemotherapy would be standard recommendation. Unfortunately the regimens for gyn cancer and colon cancer are different, her performance status is ~ 2 now, and we cannot treat both problems as promptly as would be ideal. I believe the gyn cancer is highest risk at this point; I have  discussed with my colleague in GI oncology, who agrees. I have recommended that we resume treatment for the ovarian cancer with single agent taxol weekly x3 q 4 wks for 2 cycles; possibly we could add [place] with second cycle, however single taxol was much better tolerated previously. We could then consider oral xeloda adjuvantly for the colon cancer. She will need repeat CT AP at least around start of colon regimen. We did not discuss the xeloda in detail now. If overall situation does not allow xeloda, observation only for the colon cancer does not seem unreasonable, and my partner concurs.     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 2.T4aN1a adenocarcinoma of ascending colon: other information as above, CEA not elevated. Consider adjuvant xeloda beginning in ~ 8 weeks if possible after other chemotherapy as in #1 3.diabetes: no medications listed presently 4.ARF shortly after presentation Aug 2013, multifactorial and resolved back to baseline 5.HTN 6.difficult peripheral IV access: agrees to [facility] by IR  We will begin chemo ~ 09-21-12 if PAC can be placed prior. She will premedicate taxol with decadron 20 mg 12 hrs prior.She will have day 8 on 3-19 as long as ANC >=1.5 and plt >=100k; I will see her with day 15 on 10-05-12. We will check CBGs with treatment and cover with SS insulin due to steroid premedication.  Patient and granddaughter understood discussion, had questions answered to their satisfaction and were in agreement with plan above. Time spent 40 min, >50% in discussion and coordination of care.   Reece Packer, MD

## 2012-09-18 ENCOUNTER — Other Ambulatory Visit: Payer: Self-pay | Admitting: Radiology

## 2012-09-19 ENCOUNTER — Encounter (HOSPITAL_COMMUNITY): Payer: Self-pay | Admitting: Pharmacy Technician

## 2012-09-20 ENCOUNTER — Other Ambulatory Visit: Payer: Self-pay | Admitting: Oncology

## 2012-09-20 ENCOUNTER — Ambulatory Visit (HOSPITAL_COMMUNITY)
Admission: RE | Admit: 2012-09-20 | Discharge: 2012-09-20 | Disposition: A | Discharge status: Home / Self Care | Payer: Medicare Other | Source: Ambulatory Visit | Attending: Oncology | Admitting: Oncology

## 2012-09-20 ENCOUNTER — Encounter (HOSPITAL_COMMUNITY): Payer: Self-pay

## 2012-09-20 DIAGNOSIS — C569 Malignant neoplasm of unspecified ovary: Secondary | ICD-10-CM

## 2012-09-20 DIAGNOSIS — Z79899 Other long term (current) drug therapy: Secondary | ICD-10-CM | POA: Insufficient documentation

## 2012-09-20 DIAGNOSIS — Z85038 Personal history of other malignant neoplasm of large intestine: Secondary | ICD-10-CM | POA: Insufficient documentation

## 2012-09-20 DIAGNOSIS — E119 Type 2 diabetes mellitus without complications: Secondary | ICD-10-CM | POA: Insufficient documentation

## 2012-09-20 DIAGNOSIS — E785 Hyperlipidemia, unspecified: Secondary | ICD-10-CM | POA: Insufficient documentation

## 2012-09-20 DIAGNOSIS — Z7982 Long term (current) use of aspirin: Secondary | ICD-10-CM | POA: Insufficient documentation

## 2012-09-20 DIAGNOSIS — Z90710 Acquired absence of both cervix and uterus: Secondary | ICD-10-CM | POA: Insufficient documentation

## 2012-09-20 DIAGNOSIS — I10 Essential (primary) hypertension: Secondary | ICD-10-CM | POA: Insufficient documentation

## 2012-09-20 LAB — CBC WITH DIFFERENTIAL/PLATELET
Basophils Absolute: 0 10*3/uL (ref 0.0–0.1)
Basophils Relative: 0 % (ref 0–1)
HCT: 38.6 % (ref 36.0–46.0)
Lymphocytes Relative: 28 % (ref 12–46)
MCHC: 32.4 g/dL (ref 30.0–36.0)
Neutro Abs: 2.1 10*3/uL (ref 1.7–7.7)
Neutrophils Relative %: 61 % (ref 43–77)
RDW: 16.6 % — ABNORMAL HIGH (ref 11.5–15.5)
WBC: 3.4 10*3/uL — ABNORMAL LOW (ref 4.0–10.5)

## 2012-09-20 LAB — APTT: aPTT: 26 seconds (ref 24–37)

## 2012-09-20 LAB — PROTIME-INR: INR: 0.99 (ref 0.00–1.49)

## 2012-09-20 MED ORDER — MIDAZOLAM HCL 2 MG/2ML IJ SOLN
INTRAMUSCULAR | Status: AC | PRN
  Administered 2012-09-20: 2 mg via INTRAVENOUS

## 2012-09-20 MED ORDER — SODIUM CHLORIDE 0.9 % IV SOLN: INTRAVENOUS | Status: DC

## 2012-09-20 MED ORDER — MIDAZOLAM HCL 2 MG/2ML IJ SOLN
INTRAMUSCULAR | Status: AC
  Filled 2012-09-20: qty 4

## 2012-09-20 MED ORDER — HEPARIN SOD (PORK) LOCK FLUSH 100 UNIT/ML IV SOLN
500.0000 [IU] | Freq: Once | INTRAVENOUS | Status: AC
  Administered 2012-09-20: 500 [IU] via INTRAVENOUS

## 2012-09-20 MED ORDER — LIDOCAINE HCL 1 % IJ SOLN
INTRAMUSCULAR | Status: AC
  Filled 2012-09-20: qty 20

## 2012-09-20 MED ORDER — FENTANYL CITRATE 0.05 MG/ML IJ SOLN
INTRAMUSCULAR | Status: AC
  Filled 2012-09-20: qty 4

## 2012-09-20 MED ORDER — FENTANYL CITRATE 0.05 MG/ML IJ SOLN
INTRAMUSCULAR | Status: AC | PRN
  Administered 2012-09-20: 100 ug via INTRAVENOUS

## 2012-09-20 MED ORDER — CEFAZOLIN SODIUM 1-5 GM-% IV SOLN
1.0000 g | Freq: Once | INTRAVENOUS | Status: AC
  Administered 2012-09-20: 1 g via INTRAVENOUS
  Filled 2012-09-20: qty 50

## 2012-09-20 NOTE — H&P (Signed)
Chief Complaint: "I'm here for a portacath" Referring Physician:Livesay HPI: Ann Baldwin is an 67 y.o. female with ovarian cancer who is receiving chemotherapy. She is starting to have trouble with IV access and is now scheduled for port placement. PMHx and meds reviewed. She feels well today, no recent fevers, illnesses.  Past Medical History:  Past Medical History  Diagnosis Date  . Diabetes mellitus   . Hyperlipidemia   . Ovarian cancer   . Hypertension     no current meds for since aug 2013  . Anemia   . Colon cancer     Past Surgical History:  Past Surgical History  Procedure Laterality Date  . No past surgeries    . Laparotomy  07/26/2012    Procedure: EXPLORATORY LAPAROTOMY;  Surgeon: Laurette Schimke, MD PHD;  Location: [facility];  Service: Gynecology;  Laterality: N/A;  Exploratory Laporatomy/Total Abdominal Hysterectomy Bilatderal Salpingo Oopherectomy/Tumor Debulking  . Abdominal hysterectomy  07/26/2012    Procedure: HYSTERECTOMY ABDOMINAL;  Surgeon: Laurette Schimke, MD PHD;  Location: [facility];  Service: Gynecology;;  . Salpingoophorectomy  07/26/2012    Procedure: SALPINGO OOPHORECTOMY;  Surgeon: Laurette Schimke, MD PHD;  Location: [facility];  Service: Gynecology;  Laterality: Bilateral;  . Omentectomy  07/26/2012    Procedure: OMENTECTOMY;  Surgeon: Laurette Schimke, MD PHD;  Location: [facility];  Service: Gynecology;;  . Colostomy revision  07/26/2012    Procedure: COLON RESECTION RIGHT;  Surgeon: Laurette Schimke, MD PHD;  Location: [facility];  Service: Gynecology;;  with reanastomsis    Family History:  Family History  Problem Relation Age of Onset  . Colon cancer Neg Hx   . Esophageal cancer Neg Hx   . Stomach cancer Neg Hx   . Rectal cancer Neg Hx     Social History:  reports that she has never smoked. She has never used smokeless tobacco. She reports that she does not drink alcohol or use illicit drugs.  Allergies: No Known Allergies  Medications: aspirin 81 MG tablet  (Taking) Sig - Route: Take 81 mg by mouth every morning. - Oral Class: Historical Med Number of times this order has been changed since signing: 7 [address] atorvastatin (LIPITOR) 40 MG tablet (Taking) Sig - Route: Take 40 mg by mouth at bedtime. - Oral Class: Historical Med Number of times this order has been changed since signing: 7 [address] dexamethasone (DECADRON) 4 MG tablet (Taking) 15 tablet 1 09/15/2012 Sig: Take 5 tabs with food 12 hrs. Before taxol. Class: Phone In Comment: Enough for 3 treatments. Number of times this order has been changed since signing: 1 Order [address] docusate sodium (COLACE) 100 MG capsule (Taking) Sig - Route: Take 100 mg by mouth 2 (two) times daily. - Oral Class: Historical Med Number of times this order has been changed since signing: 6 Order [address] ezetimibe (ZETIA) 10 MG tablet (Taking) Sig - Route: Take 10 mg by mouth every morning. - Oral Class: Historical Med Number of times this order has been changed since signing: 7 Order [address] HYDROcodone-acetaminophen (NORCO/VICODIN) 5-325 MG per tablet (Taking) 30 tablet 0 09/15/2012 Sig - Route: Take 1 tablet by mouth every 6 (six) hours as needed for pain (pain). - Oral Class: Phone In Number of times this order has been changed since signing: 1 Order [address] iron polysaccharides (NIFEREX) 150 MG capsule (Taking) 02/17/2012 02/16/2013 Sig - Route: Take 150 mg by mouth every evening. - Oral Class: Historical Med Number of times  this order has been changed since signing: 4 Order [address] ondansetron (ZOFRAN) 8 MG tablet (Taking) 30 tablet 2 09/15/2012 Sig - Route: Take 1 tablet (8 mg total) by mouth every 8 (eight) hours as needed for nausea. Nausea - Oral Class: Phone In Number of times this order has been changed since signing: 1 Order [address] polyethylene glycol (MIRALAX / GLYCOLAX) packet (Taking) Sig - Route: Take 17 g by mouth 2 (two) times daily as needed. constipation - Oral Class: Historical  Med    Please HPI for pertinent positives, otherwise complete 10 system ROS negative.  Physical Exam: Blood pressure 142/66, pulse 86, temperature 98.7 F (37.1 C), temperature source Oral, resp. rate 18, height 5\' 3"  (1.6 m), weight 154 lb (69.854 kg), SpO2 97.00%. Body mass index is 27.29 kg/(m^2).   General Appearance:  Alert, cooperative, no distress, appears stated age  Head:  Normocephalic, without obvious abnormality, atraumatic  ENT: Unremarkable  Neck: Supple, symmetrical, trachea midline, no adenopathy, thyroid: not enlarged, symmetric, no tenderness/mass/nodules  Lungs:   Clear to auscultation bilaterally, no w/r/r, respirations unlabored without use of accessory muscles.  Chest Wall:  No tenderness or deformity  Heart:  Regular rate and rhythm, S1, S2 normal, no murmur, rub or gallop. Carotids 2+ without bruit.  Neurologic: Normal affect, no gross deficits.   Results for orders placed during the hospital encounter of 09/20/12 (from the past 48 hour(s))  APTT     Status: None   Collection Time    09/20/12 11:51 AM      Result Value Range   aPTT 26  24 - 37 seconds  CBC WITH DIFFERENTIAL     Status: Abnormal   Collection Time    09/20/12 11:51 AM      Result Value Range   WBC 3.4 (*) 4.0 - 10.5 K/uL   RBC 3.99  3.87 - 5.11 MIL/uL   Hemoglobin 12.5  12.0 - 15.0 g/dL   HCT 16.1  09.6 - 04.5 %   MCV 96.7  78.0 - 100.0 fL   MCH 31.3  26.0 - 34.0 pg   MCHC 32.4  30.0 - 36.0 g/dL   RDW 40.9 (*) 81.1 - 91.4 %   Platelets 233  150 - 400 K/uL   Neutrophils Relative 61  43 - 77 %   Neutro Abs 2.1  1.7 - 7.7 K/uL   Lymphocytes Relative 28  12 - 46 %   Lymphs Abs 0.9  0.7 - 4.0 K/uL   Monocytes Relative 8  3 - 12 %   Monocytes Absolute 0.3  0.1 - 1.0 K/uL   Eosinophils Relative 3  0 - 5 %   Eosinophils Absolute 0.1  0.0 - 0.7 K/uL   Basophils Relative 0  0 - 1 %   Basophils Absolute 0.0  0.0 - 0.1 K/uL  PROTIME-INR     Status: None   Collection Time    09/20/12 11:51  AM      Result Value Range   Prothrombin Time 13.0  11.6 - 15.2 seconds   INR 0.99  0.00 - 1.49   No results found.  Assessment/Plan Ovarian cancer Discussed port placement, risks, complications, use of sedation. Labs reviewed, ok. Consent signed in chart.  Brayton El PA-C 09/20/2012, 12:48 PM

## 2012-09-20 NOTE — Procedures (Signed)
Successful RT IJ POWER PAC TIP SVC/RA NO COMP STABLE READY FOR USE FULL REPORT IN PACS  

## 2012-09-21 ENCOUNTER — Ambulatory Visit: Payer: Medicare Other | Admitting: Lab

## 2012-09-21 ENCOUNTER — Ambulatory Visit (HOSPITAL_BASED_OUTPATIENT_CLINIC_OR_DEPARTMENT_OTHER): Payer: Medicare Other

## 2012-09-21 ENCOUNTER — Other Ambulatory Visit: Payer: Self-pay | Admitting: *Deleted

## 2012-09-21 ENCOUNTER — Other Ambulatory Visit (HOSPITAL_BASED_OUTPATIENT_CLINIC_OR_DEPARTMENT_OTHER): Payer: Medicare Other | Admitting: Lab

## 2012-09-21 ENCOUNTER — Other Ambulatory Visit: Payer: Self-pay

## 2012-09-21 VITALS — BP 141/76 | HR 97 | Temp 98.5°F | Resp 20

## 2012-09-21 DIAGNOSIS — E119 Type 2 diabetes mellitus without complications: Secondary | ICD-10-CM

## 2012-09-21 DIAGNOSIS — C569 Malignant neoplasm of unspecified ovary: Secondary | ICD-10-CM

## 2012-09-21 DIAGNOSIS — C182 Malignant neoplasm of ascending colon: Secondary | ICD-10-CM

## 2012-09-21 DIAGNOSIS — Z5111 Encounter for antineoplastic chemotherapy: Secondary | ICD-10-CM

## 2012-09-21 LAB — CBC WITH DIFFERENTIAL/PLATELET
BASO%: 0 % (ref 0.0–2.0)
EOS%: 0 % (ref 0.0–7.0)
HCT: 36 % (ref 34.8–46.6)
LYMPH%: 7.8 % — ABNORMAL LOW (ref 14.0–49.7)
MCH: 31.6 pg (ref 25.1–34.0)
MCHC: 32.8 g/dL (ref 31.5–36.0)
MCV: 96.3 fL (ref 79.5–101.0)
MONO%: 0.8 % (ref 0.0–14.0)
NEUT%: 91.4 % — ABNORMAL HIGH (ref 38.4–76.8)
Platelets: 227 10*3/uL (ref 145–400)

## 2012-09-21 MED ORDER — FAMOTIDINE IN NACL 20-0.9 MG/50ML-% IV SOLN
20.0000 mg | Freq: Once | INTRAVENOUS | Status: AC
  Administered 2012-09-21: 20 mg via INTRAVENOUS

## 2012-09-21 MED ORDER — SODIUM CHLORIDE 0.9 % IV SOLN
Freq: Once | INTRAVENOUS | Status: AC
  Administered 2012-09-21: 12:00:00 via INTRAVENOUS

## 2012-09-21 MED ORDER — SODIUM CHLORIDE 0.9 % IV SOLN
80.0000 mg/m2 | Freq: Once | INTRAVENOUS | Status: AC
  Administered 2012-09-21: 144 mg via INTRAVENOUS
  Filled 2012-09-21: qty 24

## 2012-09-21 MED ORDER — ONDANSETRON 16 MG/50ML IVPB (CHCC)
16.0000 mg | Freq: Once | INTRAVENOUS | Status: AC
  Administered 2012-09-21: 16 mg via INTRAVENOUS

## 2012-09-21 MED ORDER — INSULIN REGULAR HUMAN 100 UNIT/ML IJ SOLN
10.0000 [IU] | Freq: Once | INTRAMUSCULAR | Status: AC
  Administered 2012-09-21: 10 [IU] via SUBCUTANEOUS
  Filled 2012-09-21: qty 0.1

## 2012-09-21 MED ORDER — HEPARIN SOD (PORK) LOCK FLUSH 100 UNIT/ML IV SOLN
500.0000 [IU] | Freq: Once | INTRAVENOUS | Status: AC | PRN
  Administered 2012-09-21: 500 [IU]
  Filled 2012-09-21: qty 5

## 2012-09-21 MED ORDER — SODIUM CHLORIDE 0.9 % IV SOLN
Freq: Once | INTRAVENOUS | Status: AC
  Administered 2012-09-21: 13:00:00 via INTRAVENOUS

## 2012-09-21 MED ORDER — LIDOCAINE-PRILOCAINE 2.5-2.5 % EX CREA: TOPICAL_CREAM | CUTANEOUS | Status: AC

## 2012-09-21 MED ORDER — DEXAMETHASONE SODIUM PHOSPHATE 10 MG/ML IJ SOLN
10.0000 mg | Freq: Once | INTRAMUSCULAR | Status: AC
  Administered 2012-09-21: 10 mg via INTRAVENOUS

## 2012-09-21 MED ORDER — DIPHENHYDRAMINE HCL 50 MG/ML IJ SOLN
50.0000 mg | Freq: Once | INTRAMUSCULAR | Status: AC
  Administered 2012-09-21: 50 mg via INTRAVENOUS

## 2012-09-21 MED ORDER — SODIUM CHLORIDE 0.9 % IJ SOLN
10.0000 mL | INTRAMUSCULAR | Status: DC | PRN
  Administered 2012-09-21: 10 mL
  Filled 2012-09-21: qty 10

## 2012-09-21 NOTE — Progress Notes (Signed)
Pt here for Taxol chemo.  Pt last had Taxol in December 2013.  Right upper chest portacath inserted on 09/20/12.  Incision C, D, and Intact with Dermabond in place.    CBG pre chemo was 489.   Granddaughter stated pt had received 6u of regular insulin at home at 0930 am.   Dr. Darrold Span notified.  Regular insulin 10u given SQ in left arm as per order, also normal saline 500 cc given IV as per md.  Explanations given to pt and granddaughter.   Pt tolerated Taxol without problems.  CBG post chemo and IVF rechecked at 1430 pm -  306.   8u of regular insulin given SQ as per order in right arm.  Dr. Darrold Span also notified.  No new orders received.  Pt was stable at discharge with granddaughter via wheelchair.  Pt and granddaughter understood to call office with any new problems.

## 2012-09-21 NOTE — Patient Instructions (Signed)
[facility] Discharge Instructions for Patients Receiving Chemotherapy  Today you received the following chemotherapy agents :  Taxol.  To help prevent nausea and vomiting after your treatment, we encourage you to take your nausea medication as instructed by your physician.    If you develop nausea and vomiting that is not controlled by your nausea medication, call the clinic. If it is after clinic hours your family physician or the after hours number for the clinic or go to the Emergency Department.   BELOW ARE SYMPTOMS THAT SHOULD BE REPORTED IMMEDIATELY:  *FEVER GREATER THAN 100.5 F  *CHILLS WITH OR WITHOUT FEVER  NAUSEA AND VOMITING THAT IS NOT CONTROLLED WITH YOUR NAUSEA MEDICATION  *UNUSUAL SHORTNESS OF BREATH  *UNUSUAL BRUISING OR BLEEDING  TENDERNESS IN MOUTH AND THROAT WITH OR WITHOUT PRESENCE OF ULCERS  *URINARY PROBLEMS  *BOWEL PROBLEMS  UNUSUAL RASH Items with * indicate a potential emergency and should be followed up as soon as possible.  One of the nurses will contact you 24 hours after your treatment. Please let the nurse know about any problems that you may have experienced. Feel free to call the clinic you have any questions or concerns. The clinic phone number is [phone].   I have been informed and understand all the instructions given to me. I know to contact the clinic, my physician, or go to the Emergency Department if any problems should occur. I do not have any questions at this time, but understand that I may call the clinic during office hours   should I have any questions or need assistance in obtaining follow up care.    __________________________________________  _____________  __________ Signature of Patient or Authorized Representative            Date                   Time    __________________________________________ Nurse's Signature    

## 2012-09-23 ENCOUNTER — Encounter (HOSPITAL_COMMUNITY): Payer: Self-pay | Admitting: Emergency Medicine

## 2012-09-23 ENCOUNTER — Emergency Department (HOSPITAL_COMMUNITY): Payer: Medicare Other

## 2012-09-23 ENCOUNTER — Inpatient Hospital Stay (HOSPITAL_COMMUNITY)
Admission: EM | Admit: 2012-09-23 | Discharge: 2012-09-29 | DRG: 389 | Disposition: A | Discharge status: Home / Self Care | Payer: Medicare Other | Attending: Obstetrics & Gynecology | Admitting: Obstetrics & Gynecology

## 2012-09-23 DIAGNOSIS — Z9221 Personal history of antineoplastic chemotherapy: Secondary | ICD-10-CM

## 2012-09-23 DIAGNOSIS — K1239 Other oral mucositis (ulcerative): Secondary | ICD-10-CM

## 2012-09-23 DIAGNOSIS — E119 Type 2 diabetes mellitus without complications: Secondary | ICD-10-CM

## 2012-09-23 DIAGNOSIS — R1110 Vomiting, unspecified: Secondary | ICD-10-CM

## 2012-09-23 DIAGNOSIS — R109 Unspecified abdominal pain: Secondary | ICD-10-CM

## 2012-09-23 DIAGNOSIS — Z794 Long term (current) use of insulin: Secondary | ICD-10-CM

## 2012-09-23 DIAGNOSIS — N179 Acute kidney failure, unspecified: Secondary | ICD-10-CM | POA: Diagnosis present

## 2012-09-23 DIAGNOSIS — C189 Malignant neoplasm of colon, unspecified: Secondary | ICD-10-CM | POA: Diagnosis present

## 2012-09-23 DIAGNOSIS — I10 Essential (primary) hypertension: Secondary | ICD-10-CM | POA: Diagnosis present

## 2012-09-23 DIAGNOSIS — R112 Nausea with vomiting, unspecified: Secondary | ICD-10-CM

## 2012-09-23 DIAGNOSIS — D509 Iron deficiency anemia, unspecified: Secondary | ICD-10-CM

## 2012-09-23 DIAGNOSIS — C182 Malignant neoplasm of ascending colon: Secondary | ICD-10-CM | POA: Diagnosis present

## 2012-09-23 DIAGNOSIS — E785 Hyperlipidemia, unspecified: Secondary | ICD-10-CM | POA: Diagnosis present

## 2012-09-23 DIAGNOSIS — K56600 Partial intestinal obstruction, unspecified as to cause: Secondary | ICD-10-CM | POA: Diagnosis present

## 2012-09-23 DIAGNOSIS — K56609 Unspecified intestinal obstruction, unspecified as to partial versus complete obstruction: Principal | ICD-10-CM | POA: Diagnosis present

## 2012-09-23 DIAGNOSIS — C569 Malignant neoplasm of unspecified ovary: Secondary | ICD-10-CM | POA: Diagnosis present

## 2012-09-23 DIAGNOSIS — E876 Hypokalemia: Secondary | ICD-10-CM | POA: Diagnosis not present

## 2012-09-23 DIAGNOSIS — Z9049 Acquired absence of other specified parts of digestive tract: Secondary | ICD-10-CM

## 2012-09-23 DIAGNOSIS — Z90710 Acquired absence of both cervix and uterus: Secondary | ICD-10-CM

## 2012-09-23 DIAGNOSIS — Z79899 Other long term (current) drug therapy: Secondary | ICD-10-CM

## 2012-09-23 DIAGNOSIS — K560 Paralytic ileus: Secondary | ICD-10-CM | POA: Diagnosis present

## 2012-09-23 DIAGNOSIS — R197 Diarrhea, unspecified: Secondary | ICD-10-CM | POA: Diagnosis present

## 2012-09-23 DIAGNOSIS — T451X5A Adverse effect of antineoplastic and immunosuppressive drugs, initial encounter: Secondary | ICD-10-CM | POA: Diagnosis present

## 2012-09-23 DIAGNOSIS — R1084 Generalized abdominal pain: Secondary | ICD-10-CM | POA: Diagnosis present

## 2012-09-23 DIAGNOSIS — R52 Pain, unspecified: Secondary | ICD-10-CM

## 2012-09-23 DIAGNOSIS — D6481 Anemia due to antineoplastic chemotherapy: Secondary | ICD-10-CM | POA: Diagnosis present

## 2012-09-23 LAB — CBC WITH DIFFERENTIAL/PLATELET
Basophils Absolute: 0 10*3/uL (ref 0.0–0.1)
Basophils Relative: 0 % (ref 0–1)
Eosinophils Absolute: 0 10*3/uL (ref 0.0–0.7)
Eosinophils Relative: 0 % (ref 0–5)
HCT: 36.1 % (ref 36.0–46.0)
Hemoglobin: 12.2 g/dL (ref 12.0–15.0)
MCH: 32 pg (ref 26.0–34.0)
MCHC: 33.8 g/dL (ref 30.0–36.0)
MCV: 94.8 fL (ref 78.0–100.0)
Monocytes Absolute: 0.1 10*3/uL (ref 0.1–1.0)
Monocytes Relative: 2 % — ABNORMAL LOW (ref 3–12)
Neutro Abs: 4.8 10*3/uL (ref 1.7–7.7)
RDW: 16.7 % — ABNORMAL HIGH (ref 11.5–15.5)

## 2012-09-23 LAB — URINALYSIS, ROUTINE W REFLEX MICROSCOPIC
Ketones, ur: NEGATIVE mg/dL
Nitrite: NEGATIVE
Protein, ur: NEGATIVE mg/dL
pH: 5.5 (ref 5.0–8.0)

## 2012-09-23 LAB — GLUCOSE, CAPILLARY: Glucose-Capillary: 152 mg/dL — ABNORMAL HIGH (ref 70–99)

## 2012-09-23 LAB — COMPREHENSIVE METABOLIC PANEL
AST: 11 U/L (ref 0–37)
Albumin: 3.5 g/dL (ref 3.5–5.2)
BUN: 23 mg/dL (ref 6–23)
Calcium: 9.2 mg/dL (ref 8.4–10.5)
Chloride: 102 mEq/L (ref 96–112)
Creatinine, Ser: 0.73 mg/dL (ref 0.50–1.10)
GFR calc non Af Amer: 86 mL/min — ABNORMAL LOW (ref >90)
Total Bilirubin: 1.1 mg/dL (ref 0.3–1.2)

## 2012-09-23 LAB — URINE MICROSCOPIC-ADD ON

## 2012-09-23 LAB — LIPASE, BLOOD: Lipase: 18 U/L (ref 11–59)

## 2012-09-23 MED ORDER — PROMETHAZINE HCL 25 MG/ML IJ SOLN
12.5000 mg | INTRAMUSCULAR | Status: AC
Start: 2012-09-23 — End: 2012-09-24
  Filled 2012-09-23: qty 1

## 2012-09-23 MED ORDER — IOHEXOL 300 MG/ML  SOLN
100.0000 mL | Freq: Once | INTRAMUSCULAR | Status: AC | PRN
  Administered 2012-09-23: 100 mL via INTRAVENOUS

## 2012-09-23 MED ORDER — ONDANSETRON HCL 4 MG/2ML IJ SOLN: 4.0000 mg | Freq: Four times a day (QID) | INTRAMUSCULAR | Status: DC | PRN

## 2012-09-23 MED ORDER — SODIUM CHLORIDE 0.9 % IV SOLN: INTRAVENOUS | Status: AC

## 2012-09-23 MED ORDER — IOHEXOL 300 MG/ML  SOLN
50.0000 mL | Freq: Once | INTRAMUSCULAR | Status: AC | PRN
  Administered 2012-09-23: 50 mL via ORAL

## 2012-09-23 MED ORDER — HYDROMORPHONE HCL PF 1 MG/ML IJ SOLN
1.0000 mg | Freq: Once | INTRAMUSCULAR | Status: AC
  Administered 2012-09-23: 1 mg via INTRAVENOUS
  Filled 2012-09-23: qty 1

## 2012-09-23 MED ORDER — HYDROMORPHONE HCL PF 1 MG/ML IJ SOLN: 0.5000 mg | INTRAMUSCULAR | Status: DC | PRN

## 2012-09-23 MED ORDER — ALUM & MAG HYDROXIDE-SIMETH 200-200-20 MG/5ML PO SUSP: 30.0000 mL | Freq: Four times a day (QID) | ORAL | Status: DC | PRN

## 2012-09-23 MED ORDER — SODIUM CHLORIDE 0.9 % IV BOLUS (SEPSIS)
1000.0000 mL | Freq: Once | INTRAVENOUS | Status: AC
  Administered 2012-09-23: 1000 mL via INTRAVENOUS

## 2012-09-23 MED ORDER — ONDANSETRON HCL 4 MG/2ML IJ SOLN
4.0000 mg | Freq: Once | INTRAMUSCULAR | Status: AC
  Administered 2012-09-23: 4 mg via INTRAVENOUS
  Filled 2012-09-23: qty 2

## 2012-09-23 MED ORDER — SODIUM CHLORIDE 0.9 % IJ SOLN: 3.0000 mL | Freq: Two times a day (BID) | INTRAMUSCULAR | Status: DC

## 2012-09-23 MED ORDER — DEXAMETHASONE 4 MG PO TABS
4.0000 mg | ORAL_TABLET | Freq: Every day | ORAL | Status: DC
  Administered 2012-09-24 (×2): 4 mg via ORAL
  Filled 2012-09-23 (×3): qty 1

## 2012-09-23 MED ORDER — IOHEXOL 300 MG/ML  SOLN: 80.0000 mL | Freq: Once | INTRAMUSCULAR | Status: DC | PRN

## 2012-09-23 MED ORDER — PROMETHAZINE HCL 25 MG/ML IJ SOLN: 25.0000 mg | Freq: Once | INTRAMUSCULAR | Status: DC

## 2012-09-23 MED ORDER — ONDANSETRON HCL 4 MG/2ML IJ SOLN: 4.0000 mg | Freq: Three times a day (TID) | INTRAMUSCULAR | Status: DC | PRN

## 2012-09-23 MED ORDER — INSULIN ASPART 100 UNIT/ML ~~LOC~~ SOLN
0.0000 [IU] | SUBCUTANEOUS | Status: DC
  Administered 2012-09-24 (×2): 3 [IU] via SUBCUTANEOUS
  Administered 2012-09-24: 1 [IU] via SUBCUTANEOUS
  Administered 2012-09-24: 3 [IU] via SUBCUTANEOUS
  Administered 2012-09-24 – 2012-09-25 (×2): 1 [IU] via SUBCUTANEOUS
  Administered 2012-09-25: 2 [IU] via SUBCUTANEOUS
  Administered 2012-09-25: 1 [IU] via SUBCUTANEOUS
  Administered 2012-09-25: 3 [IU] via SUBCUTANEOUS
  Administered 2012-09-25: 1 [IU] via SUBCUTANEOUS
  Administered 2012-09-25: 2 [IU] via SUBCUTANEOUS
  Administered 2012-09-26 (×6): 1 [IU] via SUBCUTANEOUS
  Administered 2012-09-27 – 2012-09-28 (×3): 2 [IU] via SUBCUTANEOUS
  Administered 2012-09-28: 1 [IU] via SUBCUTANEOUS

## 2012-09-23 MED ORDER — ENOXAPARIN SODIUM 40 MG/0.4ML ~~LOC~~ SOLN
40.0000 mg | SUBCUTANEOUS | Status: DC
  Administered 2012-09-24 – 2012-09-28 (×6): 40 mg via SUBCUTANEOUS
  Filled 2012-09-23 (×7): qty 0.4

## 2012-09-23 MED ORDER — HYDROMORPHONE HCL PF 1 MG/ML IJ SOLN
1.0000 mg | INTRAMUSCULAR | Status: DC | PRN
  Administered 2012-09-24 – 2012-09-29 (×21): 1 mg via INTRAVENOUS
  Filled 2012-09-23 (×23): qty 1

## 2012-09-23 MED ORDER — HYDROMORPHONE HCL PF 1 MG/ML IJ SOLN
0.5000 mg | Freq: Once | INTRAMUSCULAR | Status: AC
  Administered 2012-09-23: 0.5 mg via INTRAVENOUS
  Filled 2012-09-23: qty 1

## 2012-09-23 MED ORDER — MORPHINE SULFATE 4 MG/ML IJ SOLN
6.0000 mg | Freq: Once | INTRAMUSCULAR | Status: AC
  Administered 2012-09-23: 6 mg via INTRAVENOUS
  Filled 2012-09-23: qty 2

## 2012-09-23 MED ORDER — LORAZEPAM 0.5 MG PO TABS
0.5000 mg | ORAL_TABLET | Freq: Four times a day (QID) | ORAL | Status: DC | PRN
  Administered 2012-09-28: 0.5 mg via ORAL
  Filled 2012-09-23: qty 1

## 2012-09-23 MED ORDER — ONDANSETRON HCL 4 MG PO TABS: 4.0000 mg | ORAL_TABLET | Freq: Four times a day (QID) | ORAL | Status: DC | PRN

## 2012-09-23 NOTE — H&P (Addendum)
Chief Complaint:  N/v/abd pain  HPI: 67 yo female with one day of worsening n/v nonbloody and gen abd pain.  Last bm yesterday.  S/p partial colectomy over a month ago for colon mass/cancer.  Has had chemo also.  Been doing well.  abd inc well healed.  No sick contacts.  No fevers.    Review of Systems:  Positive and negative as per HPI otherwise all other systems are negative  Past Medical History: Past Medical History  Diagnosis Date  . Diabetes mellitus   . Hyperlipidemia   . Ovarian cancer   . Hypertension     no current meds for since aug 2013  . Anemia   . Colon cancer    Past Surgical History  Procedure Laterality Date  . No past surgeries    . Laparotomy  07/26/2012    Procedure: EXPLORATORY LAPAROTOMY;  Surgeon: Laurette Schimke, MD PHD;  Location: [facility];  Service: Gynecology;  Laterality: N/A;  Exploratory Laporatomy/Total Abdominal Hysterectomy Bilatderal Salpingo Oopherectomy/Tumor Debulking  . Abdominal hysterectomy  07/26/2012    Procedure: HYSTERECTOMY ABDOMINAL;  Surgeon: Laurette Schimke, MD PHD;  Location: [facility];  Service: Gynecology;;  . Salpingoophorectomy  07/26/2012    Procedure: SALPINGO OOPHORECTOMY;  Surgeon: Laurette Schimke, MD PHD;  Location: [facility];  Service: Gynecology;  Laterality: Bilateral;  . Omentectomy  07/26/2012    Procedure: OMENTECTOMY;  Surgeon: Laurette Schimke, MD PHD;  Location: [facility];  Service: Gynecology;;  . Colostomy revision  07/26/2012    Procedure: COLON RESECTION RIGHT;  Surgeon: Laurette Schimke, MD PHD;  Location: [facility];  Service: Gynecology;;  with reanastomsis    Medications: Prior to Admission medications   Medication Sig Start Date End Date Taking? Authorizing Kandace Elrod  aspirin 81 MG tablet Take 81 mg by mouth every morning.    Yes Historical Strummer Canipe, MD  atorvastatin (LIPITOR) 40 MG tablet Take 40 mg by mouth at bedtime.    Yes Historical Lashundra Shiveley, MD  dexamethasone (DECADRON) 4 MG tablet Take 5 tabs with food 12 hrs. Before  taxol. 09/15/12  Yes Lennis Buzzy Han, MD  docusate sodium (COLACE) 100 MG capsule Take 100 mg by mouth 2 (two) times daily.    Yes Historical Tamarick Kovalcik, MD  Ensure Plus (ENSURE PLUS) LIQD Take 237 mLs by mouth daily.   Yes Historical Amiaya Mcneeley, MD  ezetimibe (ZETIA) 10 MG tablet Take 10 mg by mouth every morning.    Yes Historical Jaine Estabrooks, MD  HYDROcodone-acetaminophen (NORCO/VICODIN) 5-325 MG per tablet Take 1 tablet by mouth every 6 (six) hours as needed for pain (pain). 09/15/12  Yes Lennis Buzzy Han, MD  insulin regular (NOVOLIN R,HUMULIN R) 100 units/mL injection Inject into the skin 3 (three) times daily before meals. Sliding scale only as needed for BS>200   Yes Historical Yarianna Varble, MD  iron polysaccharides (NIFEREX) 150 MG capsule Take 150 mg by mouth every evening.  02/17/12 02/16/13 Yes Catarina Hartshorn, MD  lidocaine-prilocaine (EMLA) cream Apply to [facility] Cath 1- 2 hours prior to access 09/21/12  Yes Lennis P Livesay, MD  LORazepam (ATIVAN) 0.5 MG tablet Take 0.5 mg by mouth every 6 (six) hours as needed (nausea).   Yes Historical Deloras Reichard, MD  ondansetron (ZOFRAN) 8 MG tablet Take 1 tablet (8 mg total) by mouth every 8 (eight) hours as needed for nausea. Nausea 09/15/12  Yes Lennis Buzzy Han, MD  polyethylene glycol (MIRALAX / GLYCOLAX) packet Take 17 g by mouth 2 (two) times daily as needed. constipation   Yes Historical  Legacy Carrender, MD    Allergies:  No Known Allergies  Social History:  reports that she has never smoked. She has never used smokeless tobacco. She reports that she does not drink alcohol or use illicit drugs.  Family History: Family History  Problem Relation Age of Onset  . Colon cancer Neg Hx   . Esophageal cancer Neg Hx   . Stomach cancer Neg Hx   . Rectal cancer Neg Hx     Physical Exam: Filed Vitals:   09/23/12 1257 09/23/12 1304 09/23/12 1639  BP:  119/51 135/60  Pulse:  111 112  Temp:  97.8 F (36.6 C) 99.7 F (37.6 C)  TempSrc:  Oral Oral  Resp:  18 16  SpO2: 96%  98% 94%   General appearance: alert, cooperative and no distress Head: Normocephalic, without obvious abnormality, atraumatic Eyes: negative Nose: Nares normal. Septum midline. Mucosa normal. No drainage or sinus tenderness. Neck: no JVD and supple, symmetrical, trachea midline Lungs: clear to auscultation bilaterally Heart: regular rate and rhythm, S1, S2 normal, no murmur, click, rub or gallop Abdomen: soft, non-tender; bowel sounds normal; no masses,  no organomegaly  V mild ttp generalized, midline incision well healed Extremities: extremities normal, atraumatic, no cyanosis or edema Pulses: 2+ and symmetric Skin: Skin color, texture, turgor normal. No rashes or lesions Neurologic: Grossly normal    Labs on Admission:   Recent Labs  09/23/12 1427  NA 137  K 3.4*  CL 102  CO2 24  GLUCOSE 196*  BUN 23  CREATININE 0.73  CALCIUM 9.2    Recent Labs  09/23/12 1427  AST 11  ALT 14  ALKPHOS 56  BILITOT 1.1  PROT 6.1  ALBUMIN 3.5    Recent Labs  09/23/12 1427  LIPASE 18    Recent Labs  09/21/12 1055 09/23/12 1427  WBC 6.5 5.7  NEUTROABS 6.0 4.8  HGB 11.8 12.2  HCT 36.0 36.1  MCV 96.3 94.8  PLT 227 210   Radiological Exams on Admission: Ct Abdomen Pelvis W Contrast  09/23/2012  *RADIOLOGY REPORT*  Clinical Data: Worsening abdominal pain and vomiting.  Ovarian carcinoma colon carcinoma.  Currently undergoing chemotherapy. Postop 2 months ago.  CT ABDOMEN AND PELVIS WITH CONTRAST  Technique:  Multidetector CT imaging of the abdomen and pelvis was performed following the standard protocol during bolus administration of intravenous contrast.  Contrast: 100 ml Omnipaque-300 and oral contrast  Comparison: 05/12/2012  Findings: The distal ileum and cecum are both mildly dilated, contain stool, and show mild wall thickening with stranding in the adjacent mesenteric fat in the right abdomen. These bowel loops appear to be within a sac like structure, just posterior to  the ascending colon and inferior to the hepatic flexure, suspicious for internal hernia, and bowel ischemia cannot be excluded.  A mass-like filling defect is seen in the ascending colon just proximal to the hepatic flexure which measures 2.2 cm and a colonic polyp cannot be excluded.  There is also mild adjacent adenopathy in the right lower quadrant small bowel mesentery, with largest lymph node measuring 1.2 cm on image 61. This is nonspecific and could be inflammatory or neoplastic in etiology.  Small amount of free fluid is noted in the pelvic cul-de-sac. Prior hysterectomy noted.  Adnexal regions are unremarkable.  A tiny calcified gallstone is seen in the gallbladder is distended however there is no evidence of gallbladder wall thickening or pericholecystic fluid.  No evidence of biliary ductal dilatation. The liver, pancreas, spleen, adrenal glands,  and kidneys are normal appearance.  No evidence of hydronephrosis.  No suspicious bone lesions are seen.  Mild bibasilar atelectasis noted.  IMPRESSION:  1.  Mild dilatation and wall thickening of distal ileum and cecum, in a somewhat sac-like structure in the right abdomen which raises suspicion for internal hernia. A strangulated hernia or bowel ischemia cannot be excluded. 2.  2 cm intraluminal mass versus stool ball in the ascending colon near the hepatic flexure.  Primary colon carcinoma cannot be excluded. 3.  Mild right lower quadrant mesenteric adenopathy, which could be inflammatory in and plastic in etiology. 4.  Cholelithiasis.  No radiographic evidence of cholecystitis. 5.  Mild bibasilar atelectasis.   Original Report Authenticated By: Myles Rosenthal, M.D.    Dg Abd Acute W/chest  09/23/2012  *RADIOLOGY REPORT*  Clinical Data: 67 year old female with abdominal pain, nausea and vomiting.  ACUTE ABDOMEN SERIES (ABDOMEN 2 VIEW & CHEST 1 VIEW)  Comparison: 08/21/2012 chest radiograph  Findings: Subsegmental atelectasis within the upper right lung and mid  left lung now present. Mild elevation of the left hemidiaphragm is again noted. There is no evidence of airspace disease, pleural effusion or pneumothorax.  The right colon is mildly distended with possible bowel wall thickening, nonspecific. Surgical suture material within the right lower abdomen noted. There is no evidence of bowel obstruction or pneumoperitoneum. No acute bony abnormalities are identified.  IMPRESSION: Mild gaseous distention of the right colon with possible wall thickening. CT may be helpful for further evaluation.  No evidence of bowel obstruction or pneumoperitoneum.  Subsegmental upper right and mid left lung atelectasis.   Original Report Authenticated By: Harmon Pier, M.D.     Assessment/Plan  67 yo female with n/v/abd pain Principal Problem:   Partial small bowel obstruction Active Problems:   Hypertension   Colon cancer, ascending   Nausea & vomiting   Abdominal pain, acute, generalized  Possible psbo ct shows ??hernia and 2 cm mass.  gen surgery has been called and will see patient in consultation.  Ck lactic acid and procalcitonin level.  I dont think her pain is well controlled right now, she is not much of a complainer but appears uncomfortable.  Conservative tx, ivf.  Tele monitoring.    DAVID,RACHAL A 09/23/2012, 8:54 PM  Spoke to dr Ezzard Standing, who request calling gyn/onc surgeon who did her surgery last month,  Dr Laurette Schimke.  Have tried to find out how to contact her group, there is no listing on amion, the operator or the cancer center along with gen gyn do not know her groups' on call schedule.  Will need to call again in the morning.

## 2012-09-23 NOTE — Consult Note (Addendum)
Re:   Ann Baldwin DOB:   01-11-1946 MRN:   [id number]  ASSESSMENT AND PLAN: 1.  Vomiting and abdominal pain  Got chemo tx - 09/21/2012  CT scan today looks odd at anastomosis in right mid abdomen - some fecalization of small bowel.  Though the CT scan report raises the question of bowel ischemia, the patient is not behaving that way right now.  Better right now.  Agree with IVF, limited po intake for now, and repeat labs.  Because of Drs. Brewster/Jackson-Moore involvement with this patient (including colon resection), I would consult them.  Discussed with Dr. Eldridge Dace.  2.  History of ovarian cancer  Drs. Harlene Ramus. 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 Ila Mcgill  History of treatment with Taxol/carboplatin 3.  Colon surgery  Synchronous right colon cancer resected 07/26/2012 by Dr. Nelly Rout - final path showed invasive adenocarcinoma, 1/2 nodes positive (T4, N1) 4.  Diabetes Mellitus 5.  Hyperlipidemia 6.  Right IJ power port - Dr. Judie Petit. Shick - 09/20/2012 7.  Gallstones.  Tiny.  Chief Complaint  Patient presents with  . Emesis  . Nausea  . Abdominal Pain   REFERRING PHYSICIAN:   Dr. Eber Hong, [facility]  HISTORY OF PRESENT ILLNESS: Ann Baldwin is a 67 y.o. (DOB: April 18, 1946)  white  female whose primary care physician is Dr. Alona Bene, [place], [place]., and comes to the [facility] with increasing abdominal pain and nausea.  She developed stomach swelling last summer, 2013.  At first this was diagnosed IBS.  But a colonoscopy 01/26/2012 by Dr. Leone Payor showed a right colon tumor, but was thought to be possibly not a primary colon malignancy.  A CT scan 02/02/2012 showed "bilateral ovarian masses, larger and more cystic on left, highly  suspicious for ovarian neoplasms. Significant ascites with evidence of peritoneal carcinomatosis."  She was treated with 6 cycles of Taxol/carboplatin by Dr. Ila Mcgill.  Her chemotx went from approx August, 2013 - 06/23/2012.  She then had a total abdominal hysterectomy, bilateral salpingo-oophorectomy, omentectomy, resection of the midportion of the ascending colon with ileotransverse enterocolostomy by Dr. Nelly Rout on 07/26/2012.   Note that general surgery was not involved with this bowel resection.  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  She has had pneumonia since surgery.  She then had a power port placed 09/20/2012 by IR and chemotx restarted 09/21/2012.  Post treatment, she developed increasing abdominal pain.  This morning she woke up with her "stomach killing her" and vomiting.  Her last BM was yesterday.  She came to the [facility]. A CT scan obtained here shows "Mild dilatation and wall thickening of distal ileum and cecum, in a somewhat sac-like structure in the right abdomen which raises suspicion for internal hernia. A strangulated hernia or bowel ischemia cannot be excluded."   Her granddaughter, Ann Baldwin, and sister, Ann Baldwin, are in the room with her.  Past Medical History  Diagnosis Date  . Diabetes mellitus   . Hyperlipidemia   . Ovarian cancer   . Hypertension     no current meds for since aug 2013  . Anemia   . Colon cancer       Past Surgical History  Procedure Laterality Date  . No past surgeries    .  Laparotomy  07/26/2012    Procedure: EXPLORATORY LAPAROTOMY;  Surgeon: Laurette Schimke, MD PHD;  Location: [facility];  Service: Gynecology;  Laterality: N/A;  Exploratory Laporatomy/Total Abdominal Hysterectomy Bilatderal Salpingo Oopherectomy/Tumor Debulking  . Abdominal hysterectomy  07/26/2012    Procedure: HYSTERECTOMY ABDOMINAL;  Surgeon: Laurette Schimke, MD PHD;  Location: [facility];  Service:  Gynecology;;  . Salpingoophorectomy  07/26/2012    Procedure: SALPINGO OOPHORECTOMY;  Surgeon: Laurette Schimke, MD PHD;  Location: [facility];  Service: Gynecology;  Laterality: Bilateral;  . Omentectomy  07/26/2012    Procedure: OMENTECTOMY;  Surgeon: Laurette Schimke, MD PHD;  Location: [facility];  Service: Gynecology;;  . Colostomy revision  07/26/2012    Procedure: COLON RESECTION RIGHT;  Surgeon: Laurette Schimke, MD PHD;  Location: [facility];  Service: Gynecology;;  with reanastomsis      Current Facility-Administered Medications  Medication Dose Route Frequency Provider Last Rate Last Dose  . iohexol (OMNIPAQUE) 300 MG/ML solution 80 mL  80 mL Intravenous Once PRN Medication Radiologist, MD       Current Outpatient Prescriptions  Medication Sig Dispense Refill  . aspirin 81 MG tablet Take 81 mg by mouth every morning.       Marland Kitchen atorvastatin (LIPITOR) 40 MG tablet Take 40 mg by mouth at bedtime.       Marland Kitchen dexamethasone (DECADRON) 4 MG tablet Take 5 tabs with food 12 hrs. Before taxol.  15 tablet  1  . docusate sodium (COLACE) 100 MG capsule Take 100 mg by mouth 2 (two) times daily.       . Ensure Plus (ENSURE PLUS) LIQD Take 237 mLs by mouth daily.      Marland Kitchen ezetimibe (ZETIA) 10 MG tablet Take 10 mg by mouth every morning.       Marland Kitchen HYDROcodone-acetaminophen (NORCO/VICODIN) 5-325 MG per tablet Take 1 tablet by mouth every 6 (six) hours as needed for pain (pain).  30 tablet  0  . insulin regular (NOVOLIN R,HUMULIN R) 100 units/mL injection Inject into the skin 3 (three) times daily before meals. Sliding scale only as needed for BS>200      . iron polysaccharides (NIFEREX) 150 MG capsule Take 150 mg by mouth every evening.       . lidocaine-prilocaine (EMLA) cream Apply to [facility] Cath 1- 2 hours prior to access  30 g  1  . LORazepam (ATIVAN) 0.5 MG tablet Take 0.5 mg by mouth every 6 (six) hours as needed (nausea).      . ondansetron (ZOFRAN) 8 MG tablet Take 1 tablet (8 mg total) by mouth every 8 (eight) hours  as needed for nausea. Nausea  30 tablet  2  . polyethylene glycol (MIRALAX / GLYCOLAX) packet Take 17 g by mouth 2 (two) times daily as needed. constipation         No Known Allergies  REVIEW OF SYSTEMS: Skin:  No history of rash.  No history of abnormal moles. Infection:  No history of hepatitis or HIV.  No history of MRSA. Neurologic:  No history of stroke.  No history of seizure.  No history of headaches. Cardiac:  No history of hypertension. No history of heart disease.   No history of seeing a cardiologist. Pulmonary:  Recent hospitalization for pneumonia.  Endocrine:  Diabetes Mellitus x 20 years.   No thyroid disease. Gastrointestinal:  No history of stomach disease.  No history of liver disease.  No history of gall bladder disease.  No history of pancreas disease.  See HPI. Urologic:  No history of kidney stones.  No history of bladder infections. GYN:  See HPI. Musculoskeletal:  No history of joint or back disease. Hematologic:  No bleeding disorder.  No history of anemia.  Not anticoagulated. Psycho-social:  The patient is oriented.   SOCIAL and FAMILY HISTORY: Divorced.   Lives in [place] She has two boys, one lives with her. She last worked in 1990 at [employer]. Her granddaughter, Ann Baldwin, and sister, Ann Baldwin, are in the room with her.  PHYSICAL EXAM: BP 135/60  Pulse 112  Temp(Src) 99.7 F (37.6 C) (Oral)  Resp 16  SpO2 94%  General: Older WF who looks sickly. HEENT: Normal. Pupils equal. Neck: Supple. No mass.  No thyroid mass. Lymph Nodes:  No supraclavicular or cervical nodes. Lungs: Clear to auscultation and symmetric breath sounds.  Porta cath in right upper chest, accessed. Heart:  RRR. No murmur or rub. Abdomen: She is mildly distended.  She has no localized tenderness or rebound. Her abdomen does not feel entirely normal. Rectal: Not done. Extremities:  Good strength and ROM  in upper and lower extremities. Neurologic:  Grossly  intact to motor and sensory function. Psychiatric: Has normal mood and affect.   DATA REVIEWED: CT scan and labs.  Notes in Epic. WBC - 5,700 - 09/23/2012  Ovidio Kin, MD,  [facility] [address]  [address]   [place], [place]    [address] Phone:  [phone] FAX:  [phone]

## 2012-09-23 NOTE — ED Provider Notes (Signed)
History     CSN: [id number]  Arrival date & time 09/23/12  1254   First MD Initiated Contact with Patient 09/23/12 1307      Chief Complaint  Patient presents with  . Emesis  . Nausea  . Abdominal Pain    (Consider location/radiation/quality/duration/timing/severity/associated sxs/prior treatment) The history is provided by the patient.  Ann Baldwin is a 67 y.o. female history of ovarian cancer and colon cancer status post colectomy and hysterectomy 2 months ago, currently on chemotherapy, diabetes here presenting with abdominal pain and vomiting. Has diffuse abdominal pain since yesterday. She also has been having profuse vomiting and unable to tolerate anything by mouth. Denies any diarrhea and she says she may have not been passing gas. No history of small bowel obstruction she did have recent surgery 2 months ago. No fevers or chills or cough. CBG was elevated 2 days ago but this morning was 102. No urinary symptoms.    Past Medical History  Diagnosis Date  . Diabetes mellitus   . Hyperlipidemia   . Ovarian cancer   . Hypertension     no current meds for since aug 2013  . Anemia   . Colon cancer     Past Surgical History  Procedure Laterality Date  . No past surgeries    . Laparotomy  07/26/2012    Procedure: EXPLORATORY LAPAROTOMY;  Surgeon: Laurette Schimke, MD PHD;  Location: [facility];  Service: Gynecology;  Laterality: N/A;  Exploratory Laporatomy/Total Abdominal Hysterectomy Bilatderal Salpingo Oopherectomy/Tumor Debulking  . Abdominal hysterectomy  07/26/2012    Procedure: HYSTERECTOMY ABDOMINAL;  Surgeon: Laurette Schimke, MD PHD;  Location: [facility];  Service: Gynecology;;  . Salpingoophorectomy  07/26/2012    Procedure: SALPINGO OOPHORECTOMY;  Surgeon: Laurette Schimke, MD PHD;  Location: [facility];  Service: Gynecology;  Laterality: Bilateral;  . Omentectomy  07/26/2012    Procedure: OMENTECTOMY;  Surgeon: Laurette Schimke, MD PHD;  Location: [facility];  Service: Gynecology;;   . Colostomy revision  07/26/2012    Procedure: COLON RESECTION RIGHT;  Surgeon: Laurette Schimke, MD PHD;  Location: [facility];  Service: Gynecology;;  with reanastomsis    Family History  Problem Relation Age of Onset  . Colon cancer Neg Hx   . Esophageal cancer Neg Hx   . Stomach cancer Neg Hx   . Rectal cancer Neg Hx     History  Substance Use Topics  . Smoking status: Never Smoker   . Smokeless tobacco: Never Used  . Alcohol Use: No    OB History   Grav Para Term Preterm Abortions TAB SAB Ect Mult Living                  Review of Systems  Gastrointestinal: Positive for vomiting and abdominal pain.  All other systems reviewed and are negative.    Allergies  Review of patient's allergies indicates no known allergies.  Home Medications   Current Outpatient Rx  Name  Route  Sig  Dispense  Refill  . aspirin 81 MG tablet   Oral   Take 81 mg by mouth every morning.          Marland Kitchen atorvastatin (LIPITOR) 40 MG tablet   Oral   Take 40 mg by mouth at bedtime.          Marland Kitchen dexamethasone (DECADRON) 4 MG tablet      Take 5 tabs with food 12 hrs. Before taxol.   15 tablet   1  Enough for 3 treatments.   . docusate sodium (COLACE) 100 MG capsule   Oral   Take 100 mg by mouth 2 (two) times daily.          . Ensure Plus (ENSURE PLUS) LIQD   Oral   Take 237 mLs by mouth daily.         Marland Kitchen ezetimibe (ZETIA) 10 MG tablet   Oral   Take 10 mg by mouth every morning.          Marland Kitchen HYDROcodone-acetaminophen (NORCO/VICODIN) 5-325 MG per tablet   Oral   Take 1 tablet by mouth every 6 (six) hours as needed for pain (pain).   30 tablet   0   . insulin regular (NOVOLIN R,HUMULIN R) 100 units/mL injection   Subcutaneous   Inject into the skin 3 (three) times daily before meals. Sliding scale only as needed for BS>200         . iron polysaccharides (NIFEREX) 150 MG capsule   Oral   Take 150 mg by mouth every evening.          . lidocaine-prilocaine (EMLA)  cream      Apply to [facility] Cath 1- 2 hours prior to access   30 g   1   . LORazepam (ATIVAN) 0.5 MG tablet   Oral   Take 0.5 mg by mouth every 6 (six) hours as needed (nausea).         . ondansetron (ZOFRAN) 8 MG tablet   Oral   Take 1 tablet (8 mg total) by mouth every 8 (eight) hours as needed for nausea. Nausea   30 tablet   2   . polyethylene glycol (MIRALAX / GLYCOLAX) packet   Oral   Take 17 g by mouth 2 (two) times daily as needed. constipation           BP 119/51  Pulse 111  Temp(Src) 97.8 F (36.6 C) (Oral)  Resp 18  SpO2 98%  Physical Exam  Nursing note and vitals reviewed. Constitutional: She is oriented to person, place, and time.  Chronically ill, uncomfortable   HENT:  Head: Normocephalic.  MM dry   Eyes: Conjunctivae are normal. Pupils are equal, round, and reactive to light.  Neck: Normal range of motion. Neck supple.  Cardiovascular: Regular rhythm and normal heart sounds.   Tachycardic   Pulmonary/Chest: Effort normal and breath sounds normal. No respiratory distress. She has no wheezes. She has no rales.  Abdominal:  Distended. Midline vertical surgical scar well healed. + diffuse tenderness. No rebound No CVAT   Musculoskeletal: Normal range of motion. She exhibits no edema and no tenderness.  Neurological: She is alert and oriented to person, place, and time.  Skin: Skin is warm and dry.  Psychiatric: She has a normal mood and affect. Her behavior is normal. Judgment and thought content normal.    ED Course  Procedures (including critical care time)  Labs Reviewed  CBC WITH DIFFERENTIAL - Abnormal; Notable for the following:    RBC 3.81 (*)    RDW 16.7 (*)    Neutrophils Relative 85 (*)    Monocytes Relative 2 (*)    All other components within normal limits  URINALYSIS, ROUTINE W REFLEX MICROSCOPIC - Abnormal; Notable for the following:    Leukocytes, UA TRACE (*)    All other components within normal limits  URINE MICROSCOPIC-ADD  ON - Abnormal; Notable for the following:    Squamous Epithelial / LPF MANY (*)  All other components within normal limits  COMPREHENSIVE METABOLIC PANEL  LIPASE, BLOOD   Dg Abd Acute W/chest  09/23/2012  *RADIOLOGY REPORT*  Clinical Data: 67 year old female with abdominal pain, nausea and vomiting.  ACUTE ABDOMEN SERIES (ABDOMEN 2 VIEW & CHEST 1 VIEW)  Comparison: 08/21/2012 chest radiograph  Findings: Subsegmental atelectasis within the upper right lung and mid left lung now present. Mild elevation of the left hemidiaphragm is again noted. There is no evidence of airspace disease, pleural effusion or pneumothorax.  The right colon is mildly distended with possible bowel wall thickening, nonspecific. Surgical suture material within the right lower abdomen noted. There is no evidence of bowel obstruction or pneumoperitoneum. No acute bony abnormalities are identified.  IMPRESSION: Mild gaseous distention of the right colon with possible wall thickening. CT may be helpful for further evaluation.  No evidence of bowel obstruction or pneumoperitoneum.  Subsegmental upper right and mid left lung atelectasis.   Original Report Authenticated By: Harmon Pier, M.D.      No diagnosis found.    MDM  Ann Baldwin is a 67 y.o. female here with ab pain, vomiting.  Concerned for SBO vs gastroenteritis. Given CBG nl, not as concerned for DKA. Will get xrays, labs, give IVF and pain meds and zofran. Will reassess.   3:38 PM UA contaminated and she is asymptomatic. Labs unremarkable. Xray showed dilated colon. CT ordered. I signed out to Dr. Hyacinth Meeker in the ED to f/u CT.        Richardean Canal, MD 09/23/12 1539

## 2012-09-23 NOTE — ED Notes (Signed)
Pt comes from home via EMS c/o of n/v and abd pain since yesterday. Pt has ovairan and colon cancer and received her last chemo treatment on Wed.  Pt denies diarrhea or fever.

## 2012-09-23 NOTE — ED Notes (Signed)
Called to give report nurse unavailable will call back.  

## 2012-09-23 NOTE — ED Notes (Signed)
Urine labeled and on sink in room  

## 2012-09-23 NOTE — ED Notes (Signed)
Patient transported to CT 

## 2012-09-23 NOTE — ED Provider Notes (Signed)
  Physical Exam  BP 119/51  Pulse 111  Temp(Src) 97.8 F (36.6 C) (Oral)  Resp 18  SpO2 98%  Physical Exam  ED Course  Procedures  MDM Patient accepted at change of shift, has a history of recent ovarian and colon cancer, on chemotherapy who presents with persistent nausea and vomiting over the last 24 hours, no bowel movements, no passing gas and no urinary complaints. Lab work shows potassium of 3.4, no leukocytosis or leukopenia, normal hemoglobin, urinalysis with no ketonuria and a normal specific gravity, x-ray shows mild gaseous distention in the right colon, no other signs of bowel obstruction.  On my exam the patient has a diffusely mildly tender abdomen with mild guarding but no peritoneal signs, no bowel sounds, no tympanitic sounds to percussion.  The patient is persistently tachycardic to 120 on my exam, has clear heart and lung sounds otherwise and appears to be significantly nauseated. She will receive more antiemetics, IV fluids, check for temperature elevation and perform a CT scan of the abdomen to rule out small bowel torsion versus other source of significant nausea vomiting and abdominal pain.  Care discussed with internist Dr. Onalee Hua, also discussed with general surgeon by his nurse in the operating room, request made for consultation at the hospitalist request.    Vida Roller, MD 09/23/12 2005

## 2012-09-23 NOTE — ED Notes (Signed)
Bed:WA18<BR> Expected date:<BR> Expected time:<BR> Means of arrival:<BR> Comments:<BR> CA pt

## 2012-09-24 ENCOUNTER — Encounter (HOSPITAL_COMMUNITY): Payer: Self-pay

## 2012-09-24 DIAGNOSIS — R1110 Vomiting, unspecified: Secondary | ICD-10-CM

## 2012-09-24 LAB — GLUCOSE, CAPILLARY
Glucose-Capillary: 132 mg/dL — ABNORMAL HIGH (ref 70–99)
Glucose-Capillary: 175 mg/dL — ABNORMAL HIGH (ref 70–99)
Glucose-Capillary: 229 mg/dL — ABNORMAL HIGH (ref 70–99)

## 2012-09-24 LAB — PROTIME-INR
INR: 1.38 (ref 0.00–1.49)
Prothrombin Time: 16.6 seconds — ABNORMAL HIGH (ref 11.6–15.2)

## 2012-09-24 LAB — CBC
Hemoglobin: 10.6 g/dL — ABNORMAL LOW (ref 12.0–15.0)
MCHC: 33.8 g/dL (ref 30.0–36.0)
Platelets: 177 10*3/uL (ref 150–400)
RDW: 17 % — ABNORMAL HIGH (ref 11.5–15.5)

## 2012-09-24 LAB — BASIC METABOLIC PANEL
BUN: 23 mg/dL (ref 6–23)
Calcium: 8.1 mg/dL — ABNORMAL LOW (ref 8.4–10.5)
GFR calc Af Amer: 83 mL/min — ABNORMAL LOW (ref >90)
GFR calc non Af Amer: 72 mL/min — ABNORMAL LOW (ref >90)
Potassium: 3.3 mEq/L — ABNORMAL LOW (ref 3.5–5.1)

## 2012-09-24 MED ORDER — POTASSIUM CHLORIDE 10 MEQ/100ML IV SOLN
10.0000 meq | INTRAVENOUS | Status: AC
  Administered 2012-09-24 (×3): 10 meq via INTRAVENOUS
  Filled 2012-09-24 (×3): qty 100

## 2012-09-24 MED ORDER — KCL IN DEXTROSE-NACL 20-5-0.45 MEQ/L-%-% IV SOLN
INTRAVENOUS | Status: DC
  Administered 2012-09-24 – 2012-09-26 (×4): via INTRAVENOUS
  Filled 2012-09-24 (×8): qty 1000

## 2012-09-24 NOTE — Progress Notes (Signed)
Patient rested well during the night. Upon arrival to the department pt temp was at 102 at about 10pm, rechecked and the temp decreased to 98.9 and current @6 :15 am  Temp @ 98.7. Patient remain ST in the 100's during to the night.Patient up to [facility] HR increases to 140 briefly and then stabilize to 100-120. Patient asymptomatic. Tama Gander, RN BSN.

## 2012-09-24 NOTE — Progress Notes (Signed)
Patient did last night by her partner Dr. Onalee Hua, at this time patient's issues are entirely surgical arising from her recent surgery etc., case discussed with general surgeon Dr. Ezzard Standing, OB/GYN surgeon Dr. Christell Constant, case will be transferred under the care of Dr. Christell Constant who was recently operated on the patient Dr. Ezzard Standing general surgery will follow the patient closely. Hospitalist team will sign off from this patient at this point.

## 2012-09-24 NOTE — Progress Notes (Signed)
Subjective: Isolated fever last pm. Denies abd pain/n/v this am. Reports BM this am. States feel a lot better today  Objective: Vital signs in last 24 hours: Temp:  [97.8 F (36.6 C)-102.7 F (39.3 C)] 98.7 F (37.1 C) (03/15 0614) Pulse Rate:  [109-126] 109 (03/15 0614) Resp:  [16-20] 18 (03/15 0614) BP: (119-135)/(47-60) 122/56 mmHg (03/15 0614) SpO2:  [91 %-98 %] 94 % (03/15 0614) Weight:  [158 lb 6 oz (71.838 kg)] 158 lb 6 oz (71.838 kg) (03/14 2146) Last BM Date: 09/23/12  Intake/Output from previous day: 03/14 0701 - 03/15 0700 In: 800 [I.V.:800] Out: 675 [Urine:675] Intake/Output this shift: Total I/O In: -  Out: 200 [Urine:200]  Alert, nad, non toxic cta Soft, nt, nd. Well healed incision  Lab Results:   Recent Labs  09/23/12 1427 09/24/12 0511  WBC 5.7 7.7  HGB 12.2 10.6*  HCT 36.1 31.4*  PLT 210 177   BMET  Recent Labs  09/23/12 1427 09/24/12 0511  NA 137 133*  K 3.4* 3.3*  CL 102 100  CO2 24 22  GLUCOSE 196* 140*  BUN 23 23  CREATININE 0.73 0.82  CALCIUM 9.2 8.1*   PT/INR  Recent Labs  09/24/12 0511  LABPROT 16.6*  INR 1.38   ABG No results found for this basename: PHART, PCO2, PO2, HCO3,  in the last 72 hours  Studies/Results: Ct Abdomen Pelvis W Contrast  09/23/2012  *RADIOLOGY REPORT*  Clinical Data: Worsening abdominal pain and vomiting.  Ovarian carcinoma colon carcinoma.  Currently undergoing chemotherapy. Postop 2 months ago.  CT ABDOMEN AND PELVIS WITH CONTRAST  Technique:  Multidetector CT imaging of the abdomen and pelvis was performed following the standard protocol during bolus administration of intravenous contrast.  Contrast: 100 ml Omnipaque-300 and oral contrast  Comparison: 05/12/2012  Findings: The distal ileum and cecum are both mildly dilated, contain stool, and show mild wall thickening with stranding in the adjacent mesenteric fat in the right abdomen. These bowel loops appear to be within a sac like structure,  just posterior to the ascending colon and inferior to the hepatic flexure, suspicious for internal hernia, and bowel ischemia cannot be excluded.  A mass-like filling defect is seen in the ascending colon just proximal to the hepatic flexure which measures 2.2 cm and a colonic polyp cannot be excluded.  There is also mild adjacent adenopathy in the right lower quadrant small bowel mesentery, with largest lymph node measuring 1.2 cm on image 61. This is nonspecific and could be inflammatory or neoplastic in etiology.  Small amount of free fluid is noted in the pelvic cul-de-sac. Prior hysterectomy noted.  Adnexal regions are unremarkable.  A tiny calcified gallstone is seen in the gallbladder is distended however there is no evidence of gallbladder wall thickening or pericholecystic fluid.  No evidence of biliary ductal dilatation. The liver, pancreas, spleen, adrenal glands, and kidneys are normal appearance.  No evidence of hydronephrosis.  No suspicious bone lesions are seen.  Mild bibasilar atelectasis noted.  IMPRESSION:  1.  Mild dilatation and wall thickening of distal ileum and cecum, in a somewhat sac-like structure in the right abdomen which raises suspicion for internal hernia. A strangulated hernia or bowel ischemia cannot be excluded. 2.  2 cm intraluminal mass versus stool ball in the ascending colon near the hepatic flexure.  Primary colon carcinoma cannot be excluded. 3.  Mild right lower quadrant mesenteric adenopathy, which could be inflammatory in and plastic in etiology. 4.  Cholelithiasis.  No  radiographic evidence of cholecystitis. 5.  Mild bibasilar atelectasis.   Original Report Authenticated By: Myles Rosenthal, M.D.    Dg Abd Acute W/chest  09/23/2012  *RADIOLOGY REPORT*  Clinical Data: 67 year old female with abdominal pain, nausea and vomiting.  ACUTE ABDOMEN SERIES (ABDOMEN 2 VIEW & CHEST 1 VIEW)  Comparison: 08/21/2012 chest radiograph  Findings: Subsegmental atelectasis within the upper  right lung and mid left lung now present. Mild elevation of the left hemidiaphragm is again noted. There is no evidence of airspace disease, pleural effusion or pneumothorax.  The right colon is mildly distended with possible bowel wall thickening, nonspecific. Surgical suture material within the right lower abdomen noted. There is no evidence of bowel obstruction or pneumoperitoneum. No acute bony abnormalities are identified.  IMPRESSION: Mild gaseous distention of the right colon with possible wall thickening. CT may be helpful for further evaluation.  No evidence of bowel obstruction or pneumoperitoneum.  Subsegmental upper right and mid left lung atelectasis.   Original Report Authenticated By: Harmon Pier, M.D.     Anti-infectives: Anti-infectives   None      Assessment/Plan: Nausea/vomiting/abd pain Recent h/o TAHBSO, omentectomy, rt colectomy   Agree with Dr Ezzard Standing - CT just doesn't look quite right around anastomosis - but no evidence of obstruction, leak.   Clinically she is improved.  Will give sips of liquids today and monitor abdominal symptoms Hypokalemia - replace potassium Renew mIVF  Mary Sella. Andrey Campanile, MD, FACS General, Bariatric, & Minimally Invasive Surgery [facility], [place]   LOS: 1 day    Ann Baldwin 09/24/2012

## 2012-09-24 NOTE — Progress Notes (Signed)
Chart reviewed.  Pt seen.   Appreciate input from all providers involved in the care of this pt  -->Will continue to follow; nothing to add at this time in terms of management

## 2012-09-25 ENCOUNTER — Other Ambulatory Visit: Payer: Self-pay | Admitting: Oncology

## 2012-09-25 DIAGNOSIS — C189 Malignant neoplasm of colon, unspecified: Secondary | ICD-10-CM

## 2012-09-25 LAB — CBC WITH DIFFERENTIAL/PLATELET
Eosinophils Relative: 0 % (ref 0–5)
HCT: 27.7 % — ABNORMAL LOW (ref 36.0–46.0)
Hemoglobin: 9.2 g/dL — ABNORMAL LOW (ref 12.0–15.0)
Lymphocytes Relative: 4 % — ABNORMAL LOW (ref 12–46)
Lymphs Abs: 0.3 10*3/uL — ABNORMAL LOW (ref 0.7–4.0)
MCV: 95.5 fL (ref 78.0–100.0)
Monocytes Absolute: 0.1 10*3/uL (ref 0.1–1.0)
Monocytes Relative: 1 % — ABNORMAL LOW (ref 3–12)
Platelets: 182 10*3/uL (ref 150–400)
RBC: 2.9 MIL/uL — ABNORMAL LOW (ref 3.87–5.11)
WBC: 7.9 10*3/uL (ref 4.0–10.5)

## 2012-09-25 LAB — GLUCOSE, CAPILLARY
Glucose-Capillary: 208 mg/dL — ABNORMAL HIGH (ref 70–99)
Glucose-Capillary: 150 mg/dL — ABNORMAL HIGH (ref 70–99)

## 2012-09-25 LAB — BASIC METABOLIC PANEL
CO2: 25 mEq/L (ref 19–32)
Chloride: 102 mEq/L (ref 96–112)
Potassium: 3.9 mEq/L (ref 3.5–5.1)
Sodium: 134 mEq/L — ABNORMAL LOW (ref 135–145)

## 2012-09-25 LAB — PROCALCITONIN: Procalcitonin: 2.46 ng/mL

## 2012-09-25 LAB — IRON AND TIBC: Iron: 14 ug/dL — ABNORMAL LOW (ref 42–135)

## 2012-09-25 MED ORDER — ONDANSETRON 8 MG/NS 50 ML IVPB
8.0000 mg | Freq: Three times a day (TID) | INTRAVENOUS | Status: DC | PRN
  Administered 2012-09-25: 8 mg via INTRAVENOUS
  Filled 2012-09-25: qty 8

## 2012-09-25 MED ORDER — SODIUM CHLORIDE 0.9 % IJ SOLN
10.0000 mL | INTRAMUSCULAR | Status: DC | PRN
  Administered 2012-09-25 – 2012-09-29 (×2): 10 mL

## 2012-09-25 NOTE — Progress Notes (Signed)
09/25/2012, 8:35 AM  Hospital day: 3 Antibiotics:  Chemotherapy: day 1 cycle 1 weekly taxol given 09-21-12 (previously had neoadjuvant chemotherapy with taxol and carboplatin from 04-07-12 thru 06-23-2012).  Appreciate Dr Ezzard Standing letting me know of admission. I have known patient since 02-2012, treating her with neoadjuvant chemotherapy for primary gyn cancer.  Note contact for Dr Laurette Schimke over weekend is [facility] oncology  [phone] and phone for gyn oncology at [facility] during week is [phone].  Oncologic history:  Patient presented with GI symptoms thought IBD in April 2013. She had colonoscopy by Dr Stan Head 01-26-12, with large ulcer in ascending colon, pathology [phone] found adenocarcinoma consistent with noncolonic primary. CT AP (with contrast) in [facility] system 02-02-12 had complex solid and cystic left adnexal mass, right ovarian mass, omental caking and peritoneal carcinomatosis. She was seen by Dr Nelly Rout on 02-09-12, with CA 125 of 22,938 and CEA 0.5; CMET resulted after Dr Forrestine Him visit had BUN 63 and creatinine 8.78. She was admitted to [facility] 7-31 thru 02-21-12 with acute renal failure felt multifactorial including prerenal with N/V/diarrhea/ascites, NSAID and ACE inhibitors, lasix, IV contrast, HTN and DM. Renal function progressively improved, allowing addition of careful carboplatin to taxol beginning cycle 3 chemotherapy. She had paracentesis of 5 liters on 02-11-12 and 2.4 liters on 02-18-12. CA 125, which was 22,938 at presentation, was down to 1835 prior to cycle 4 (lab done 04-04-12) and was 17.4 on 09-14-12. She had first 2 cycles of weekly taxol as single agent following the renal problems on presentation, first cycle started on 02-24-12, tolerated adequately tho blood sugars elevate significantly with her diabetes and the steroids. She had carboplatin added day 1 beginning cycle 3 (04-07-12). Last chemotherapy was 06-23-12 she did not require gCSF with the neoadjuvant treatment.  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 The colon cancer involved one of 2 nodes, no LVSI, no perineural invasion, margins 2 cm  ([id number]). CEA on 09-14-12 was 1.9 and there is no evidence of metastatic disease to liver by CTs. She was transfused 2 units PRBCs after surgery. Patient was progressively improving when I saw her on 09-14-12. Plan at that point was to resume chemotherapy with 2 more cycles of chemo for the gyn carcinoma, starting with weekly taxol and subsequently to add carboplatin if PS allowed; we may try xeloda adjuvant for the colon ca after chemo for gyn primary is completed.  PAC was placed by IR and she had Cycle 1 day 1 taxol on 09-21-12, tolerated without significant problems. She tends to have some nausea after chemo, and I have added zofran to hospital prn meds now. She uses decadron premed prior to taxol only, is not on regular steroids, and I have DCd that medication from inpatient orders now.   Subjective: No abdominal pain presently. Tolerating diet coke sips for last 24 hours. Slight nausea this am which may be from chemo, requesting antiemetic. PAC functioning well. Denies SOB. Denies fever PTA. No family here presently  Objective: Vital signs in last 24 hours: Blood pressure 141/58, pulse 92, temperature 98 F (36.7 C), temperature source Oral, resp. rate 16, height 5\' 3"  (1.6 m), weight 158 lb 6 oz (71.838 kg), SpO2 97.00%.   Intake/Output from previous day: 03/15 0701 - 03/16 0700 In: 300 [I.V.:300] Out: 400 [  Urine:400] Intake/Output this shift:    Physical exam: awake, alert, oriented and appropriate, NAD lying supine on RA. Hair is growing back. PERRL, not icteric. Oral mucosa moist and clear. Heart RRR. PAC with  minimal resolving ecchymosis, infusing maintenance IVF at 75 cc/hr without difficulty. Heart RRR, no gallop. Lungs clear. Abdomen soft, quiet, not distended, not tender to gentle exam. LE no edema, cords, tenderness. Moves all extremities equally in bed.  Lab Results:  Recent Labs  09/24/12 0511 09/25/12 0500  WBC 7.7 7.9  HGB 10.6* 9.2*  HCT 31.4* 27.7*  PLT 177 182   BMET  Recent Labs  09/24/12 0511 09/25/12 0500  NA 133* 134*  K 3.3* 3.9  CL 100 102  CO2 22 25  GLUCOSE 140* 207*  BUN 23 16  CREATININE 0.82 0.58  CALCIUM 8.1* 8.8    Studies/Results: Ct Abdomen Pelvis W Contrast  09/23/2012  *RADIOLOGY REPORT*  Clinical Data: Worsening abdominal pain and vomiting.  Ovarian carcinoma colon carcinoma.  Currently undergoing chemotherapy. Postop 2 months ago.  CT ABDOMEN AND PELVIS WITH CONTRAST  Technique:  Multidetector CT imaging of the abdomen and pelvis was performed following the standard protocol during bolus administration of intravenous contrast.  Contrast: 100 ml Omnipaque-300 and oral contrast  Comparison: 05/12/2012  Findings: The distal ileum and cecum are both mildly dilated, contain stool, and show mild wall thickening with stranding in the adjacent mesenteric fat in the right abdomen. These bowel loops appear to be within a sac like structure, just posterior to the ascending colon and inferior to the hepatic flexure, suspicious for internal hernia, and bowel ischemia cannot be excluded.  A mass-like filling defect is seen in the ascending colon just proximal to the hepatic flexure which measures 2.2 cm and a colonic polyp cannot be excluded.  There is also mild adjacent adenopathy in the right lower quadrant small bowel mesentery, with largest lymph node measuring 1.2 cm on image 61. This is nonspecific and could be inflammatory or neoplastic in etiology.  Small amount of free fluid is noted in the pelvic cul-de-sac. Prior hysterectomy noted.  Adnexal regions are  unremarkable.  A tiny calcified gallstone is seen in the gallbladder is distended however there is no evidence of gallbladder wall thickening or pericholecystic fluid.  No evidence of biliary ductal dilatation. The liver, pancreas, spleen, adrenal glands, and kidneys are normal appearance.  No evidence of hydronephrosis.  No suspicious bone lesions are seen.  Mild bibasilar atelectasis noted.  IMPRESSION:  1.  Mild dilatation and wall thickening of distal ileum and cecum, in a somewhat sac-like structure in the right abdomen which raises suspicion for internal hernia. A strangulated hernia or bowel ischemia cannot be excluded. 2.  2 cm intraluminal mass versus stool ball in the ascending colon near the hepatic flexure.  Primary colon carcinoma cannot be excluded. 3.  Mild right lower quadrant mesenteric adenopathy, which could be inflammatory in and plastic in etiology. 4.  Cholelithiasis.  No radiographic evidence of cholecystitis. 5.  Mild bibasilar atelectasis.   Original Report Authenticated By: Myles Rosenthal, M.D.    Dg Abd Acute W/chest  09/23/2012  *RADIOLOGY REPORT*  Clinical Data: 67 year old female with abdominal pain, nausea and vomiting.  ACUTE ABDOMEN SERIES (ABDOMEN 2 VIEW & CHEST 1 VIEW)  Comparison: 08/21/2012 chest radiograph  Findings: Subsegmental atelectasis within the upper right lung and mid left lung now present. Mild elevation of the left hemidiaphragm is again noted. There is no evidence of airspace disease, pleural  effusion or pneumothorax.  The right colon is mildly distended with possible bowel wall thickening, nonspecific. Surgical suture material within the right lower abdomen noted. There is no evidence of bowel obstruction or pneumoperitoneum. No acute bony abnormalities are identified.  IMPRESSION: Mild gaseous distention of the right colon with possible wall thickening. CT may be helpful for further evaluation.  No evidence of bowel obstruction or pneumoperitoneum.  Subsegmental  upper right and mid left lung atelectasis.   Original Report Authenticated By: Harmon Pier, M.D.      Assessment/Plan: 1. Acute onset of severe abdominal pain 09-23-12, in patient who is post TAH/BSO/ infragastric omentectomy and partial resection of sigmoid colon with ileotransverse loop enterostomy 07-26-12, by Dr Laurette Schimke for known gyn carcinoma and synchronous colon cancer which was not known prior to that surgery. General surgery following. Weekend contact # for gyn oncology at top of this note. 2.IIIB high grade serous ovarian cancer: post neoadjuvant chemotherapy Aug - Dec 2013, complete debulking 07-26-2012, and day1 cycle 1 weekly taxol given 09-21-12. Hold further chemo until abdominal problem resolved (due day 8 on 3-19 and day 15 on 10-05-12). I do not expect that counts will drop significantly from the single agent taxol. 3. Stage III colon carcinoma: found unexpectedly at surgery for gyn carcinoma. Information as above.  4.Diabetes: on coverage. Note decadron stopped now. 5.PAC in 6.acute renal failure, multifactorial shortly after presentation with cancer in summer 2013. 7.anemia: multifactorial with GI blood loss, chemo, recent surgery. Had been on oral iron, but likely will do better with IV feraheme if iron studies still low (ordered now).   Please call if I can be of help between my rounds.  LIVESAY,LENNIS P  [phone]

## 2012-09-25 NOTE — Progress Notes (Signed)
Subjective: Doing well. Lots of loose stool yesterday. A little nausea - Dr Darrold Span thinks related to recent chemo. +flatus. Hardly any abd pain. Hasn't ambulated in halls  Objective: Vital signs in last 24 hours: Temp:  [98 F (36.7 C)-98.4 F (36.9 C)] 98 F (36.7 C) (03/16 0553) Pulse Rate:  [72-92] 92 (03/16 0553) Resp:  [16-18] 16 (03/16 0553) BP: (120-141)/(56-58) 141/58 mmHg (03/16 0553) SpO2:  [93 %-97 %] 97 % (03/16 0553) Last BM Date: 09/23/12  Intake/Output from previous day: 03/15 0701 - 03/16 0700 In: 1425 [I.V.:1425] Out: 400 [Urine:400] Intake/Output this shift:    Alert, nad Soft, nt, nd  Lab Results:   Recent Labs  09/24/12 0511 09/25/12 0500  WBC 7.7 7.9  HGB 10.6* 9.2*  HCT 31.4* 27.7*  PLT 177 182   BMET  Recent Labs  09/24/12 0511 09/25/12 0500  NA 133* 134*  K 3.3* 3.9  CL 100 102  CO2 22 25  GLUCOSE 140* 207*  BUN 23 16  CREATININE 0.82 0.58  CALCIUM 8.1* 8.8   PT/INR  Recent Labs  09/24/12 0511  LABPROT 16.6*  INR 1.38   ABG No results found for this basename: PHART, PCO2, PO2, HCO3,  in the last 72 hours  Studies/Results: Ct Abdomen Pelvis W Contrast  09/23/2012  *RADIOLOGY REPORT*  Clinical Data: Worsening abdominal pain and vomiting.  Ovarian carcinoma colon carcinoma.  Currently undergoing chemotherapy. Postop 2 months ago.  CT ABDOMEN AND PELVIS WITH CONTRAST  Technique:  Multidetector CT imaging of the abdomen and pelvis was performed following the standard protocol during bolus administration of intravenous contrast.  Contrast: 100 ml Omnipaque-300 and oral contrast  Comparison: 05/12/2012  Findings: The distal ileum and cecum are both mildly dilated, contain stool, and show mild wall thickening with stranding in the adjacent mesenteric fat in the right abdomen. These bowel loops appear to be within a sac like structure, just posterior to the ascending colon and inferior to the hepatic flexure, suspicious for internal  hernia, and bowel ischemia cannot be excluded.  A mass-like filling defect is seen in the ascending colon just proximal to the hepatic flexure which measures 2.2 cm and a colonic polyp cannot be excluded.  There is also mild adjacent adenopathy in the right lower quadrant small bowel mesentery, with largest lymph node measuring 1.2 cm on image 61. This is nonspecific and could be inflammatory or neoplastic in etiology.  Small amount of free fluid is noted in the pelvic cul-de-sac. Prior hysterectomy noted.  Adnexal regions are unremarkable.  A tiny calcified gallstone is seen in the gallbladder is distended however there is no evidence of gallbladder wall thickening or pericholecystic fluid.  No evidence of biliary ductal dilatation. The liver, pancreas, spleen, adrenal glands, and kidneys are normal appearance.  No evidence of hydronephrosis.  No suspicious bone lesions are seen.  Mild bibasilar atelectasis noted.  IMPRESSION:  1.  Mild dilatation and wall thickening of distal ileum and cecum, in a somewhat sac-like structure in the right abdomen which raises suspicion for internal hernia. A strangulated hernia or bowel ischemia cannot be excluded. 2.  2 cm intraluminal mass versus stool ball in the ascending colon near the hepatic flexure.  Primary colon carcinoma cannot be excluded. 3.  Mild right lower quadrant mesenteric adenopathy, which could be inflammatory in and plastic in etiology. 4.  Cholelithiasis.  No radiographic evidence of cholecystitis. 5.  Mild bibasilar atelectasis.   Original Report Authenticated By: Myles Rosenthal, M.D.  Dg Abd Acute W/chest  09/23/2012  *RADIOLOGY REPORT*  Clinical Data: 67 year old female with abdominal pain, nausea and vomiting.  ACUTE ABDOMEN SERIES (ABDOMEN 2 VIEW & CHEST 1 VIEW)  Comparison: 08/21/2012 chest radiograph  Findings: Subsegmental atelectasis within the upper right lung and mid left lung now present. Mild elevation of the left hemidiaphragm is again noted.  There is no evidence of airspace disease, pleural effusion or pneumothorax.  The right colon is mildly distended with possible bowel wall thickening, nonspecific. Surgical suture material within the right lower abdomen noted. There is no evidence of bowel obstruction or pneumoperitoneum. No acute bony abnormalities are identified.  IMPRESSION: Mild gaseous distention of the right colon with possible wall thickening. CT may be helpful for further evaluation.  No evidence of bowel obstruction or pneumoperitoneum.  Subsegmental upper right and mid left lung atelectasis.   Original Report Authenticated By: Harmon Pier, M.D.     Anti-infectives: Anti-infectives   None      Assessment/Plan: Nausea/vomiting/abd pain  Recent h/o TAHBSO, omentectomy, rt colectomy   Clinically doing better. No fever. No wbc. No abd pain. Will adv diet to clears today and see how tolerates Ambulate  Mary Sella. Andrey Campanile, MD, FACS General, Bariatric, & Minimally Invasive Surgery [facility], [place]   LOS: 2 days    Atilano Ina 09/25/2012

## 2012-09-25 NOTE — Progress Notes (Signed)
Subjective: Interval History: has complaints diarrhea/cramping.  Objective: Vital signs in last 24 hours: Temp:  [98 F (36.7 C)-98.4 F (36.9 C)] 98 F (36.7 C) (03/16 0553) Pulse Rate:  [88-92] 92 (03/16 0553) Resp:  [16] 16 (03/16 0553) BP: (135-141)/(56-58) 141/58 mmHg (03/16 0553) SpO2:  [93 %-97 %] 97 % (03/16 0553)  Intake/Output from previous day: 03/15 0701 - 03/16 0700 In: 1425 [I.V.:1425] Out: 400 [Urine:400]     General appearance: alert Abdomen: normal findings: soft, non-tender Extremities: extremities normal, atraumatic, no cyanosis or edema  Results for orders placed during the hospital encounter of 09/23/12 (from the past 24 hour(s))  GLUCOSE, CAPILLARY     Status: Abnormal   Collection Time    09/24/12  4:16 PM      Result Value Range   Glucose-Capillary 219 (*) 70 - 99 mg/dL  GLUCOSE, CAPILLARY     Status: Abnormal   Collection Time    09/24/12  8:18 PM      Result Value Range   Glucose-Capillary 229 (*) 70 - 99 mg/dL   Comment 1 Notify RN    GLUCOSE, CAPILLARY     Status: Abnormal   Collection Time    09/25/12 12:14 AM      Result Value Range   Glucose-Capillary 213 (*) 70 - 99 mg/dL   Comment 1 Notify RN    GLUCOSE, CAPILLARY     Status: Abnormal   Collection Time    09/25/12  4:39 AM      Result Value Range   Glucose-Capillary 208 (*) 70 - 99 mg/dL   Comment 1 Notify RN    PROCALCITONIN     Status: None   Collection Time    09/25/12  5:00 AM      Result Value Range   Procalcitonin 2.46    BASIC METABOLIC PANEL     Status: Abnormal   Collection Time    09/25/12  5:00 AM      Result Value Range   Sodium 134 (*) 135 - 145 mEq/L   Potassium 3.9  3.5 - 5.1 mEq/L   Chloride 102  96 - 112 mEq/L   CO2 25  19 - 32 mEq/L   Glucose, Bld 207 (*) 70 - 99 mg/dL   BUN 16  6 - 23 mg/dL   Creatinine, Ser 0.98  0.50 - 1.10 mg/dL   Calcium 8.8  8.4 - 11.9 mg/dL   GFR calc non Af Amer >90  >90 mL/min   GFR calc Af Amer >90  >90 mL/min  CBC WITH  DIFFERENTIAL     Status: Abnormal   Collection Time    09/25/12  5:00 AM      Result Value Range   WBC 7.9  4.0 - 10.5 K/uL   RBC 2.90 (*) 3.87 - 5.11 MIL/uL   Hemoglobin 9.2 (*) 12.0 - 15.0 g/dL   HCT 14.7 (*) 82.9 - 56.2 %   MCV 95.5  78.0 - 100.0 fL   MCH 31.7  26.0 - 34.0 pg   MCHC 33.2  30.0 - 36.0 g/dL   RDW 13.0 (*) 86.5 - 78.4 %   Platelets 182  150 - 400 K/uL   Neutrophils Relative 95 (*) 43 - 77 %   Neutro Abs 7.6  1.7 - 7.7 K/uL   Lymphocytes Relative 4 (*) 12 - 46 %   Lymphs Abs 0.3 (*) 0.7 - 4.0 K/uL   Monocytes Relative 1 (*) 3 - 12 %  Monocytes Absolute 0.1  0.1 - 1.0 K/uL   Eosinophils Relative 0  0 - 5 %   Eosinophils Absolute 0.0  0.0 - 0.7 K/uL   Basophils Relative 0  0 - 1 %   Basophils Absolute 0.0  0.0 - 0.1 K/uL  GLUCOSE, CAPILLARY     Status: Abnormal   Collection Time    09/25/12  7:22 AM      Result Value Range   Glucose-Capillary 161 (*) 70 - 99 mg/dL  GLUCOSE, CAPILLARY     Status: Abnormal   Collection Time    09/25/12 11:49 AM      Result Value Range   Glucose-Capillary 150 (*) 70 - 99 mg/dL        Scheduled Meds: . enoxaparin (LOVENOX) injection  40 mg Subcutaneous Q24H  . insulin aspart  0-9 Units Subcutaneous Q4H  . sodium chloride  3 mL Intravenous Q12H   Continuous Infusions: . dextrose 5 % and 0.45 % NaCl with KCl 20 mEq/L 75 mL/hr at 09/25/12 0255   PRN Meds:alum & mag hydroxide-simeth, HYDROmorphone (DILAUDID) injection, LORazepam, ondansetron (ZOFRAN) IV, ondansetron (ZOFRAN) IV, ondansetron, sodium chloride  Assessment/Plan: Bowel function returning-- above notes appreciated Diarrhea--electrolytes/fluids ok DM--elevated CBGs in setting of recent steroids; cbgs trending down, 250  Continue to monitor, diet management per General Surgery   LOS: 2 days   JACKSON-MOORE,Amana Bouska A

## 2012-09-26 ENCOUNTER — Inpatient Hospital Stay (HOSPITAL_COMMUNITY): Payer: Medicare Other

## 2012-09-26 DIAGNOSIS — R109 Unspecified abdominal pain: Secondary | ICD-10-CM

## 2012-09-26 DIAGNOSIS — R197 Diarrhea, unspecified: Secondary | ICD-10-CM

## 2012-09-26 DIAGNOSIS — D509 Iron deficiency anemia, unspecified: Secondary | ICD-10-CM

## 2012-09-26 DIAGNOSIS — C569 Malignant neoplasm of unspecified ovary: Secondary | ICD-10-CM

## 2012-09-26 LAB — GLUCOSE, CAPILLARY
Glucose-Capillary: 130 mg/dL — ABNORMAL HIGH (ref 70–99)
Glucose-Capillary: 134 mg/dL — ABNORMAL HIGH (ref 70–99)
Glucose-Capillary: 135 mg/dL — ABNORMAL HIGH (ref 70–99)
Glucose-Capillary: 140 mg/dL — ABNORMAL HIGH (ref 70–99)
Glucose-Capillary: 140 mg/dL — ABNORMAL HIGH (ref 70–99)

## 2012-09-26 LAB — CBC WITH DIFFERENTIAL/PLATELET
Basophils Absolute: 0 10*3/uL (ref 0.0–0.1)
Basophils Relative: 0 % (ref 0–1)
Eosinophils Absolute: 0 10*3/uL (ref 0.0–0.7)
Eosinophils Relative: 0 % (ref 0–5)
MCH: 32.2 pg (ref 26.0–34.0)
MCHC: 33.7 g/dL (ref 30.0–36.0)
MCV: 95.5 fL (ref 78.0–100.0)
Monocytes Absolute: 0.4 10*3/uL (ref 0.1–1.0)
Platelets: 204 10*3/uL (ref 150–400)
RDW: 17 % — ABNORMAL HIGH (ref 11.5–15.5)
WBC: 7.7 10*3/uL (ref 4.0–10.5)

## 2012-09-26 LAB — COMPREHENSIVE METABOLIC PANEL
ALT: 9 U/L (ref 0–35)
AST: 9 U/L (ref 0–37)
Calcium: 8.7 mg/dL (ref 8.4–10.5)
Creatinine, Ser: 0.69 mg/dL (ref 0.50–1.10)
Sodium: 130 mEq/L — ABNORMAL LOW (ref 135–145)
Total Protein: 5.5 g/dL — ABNORMAL LOW (ref 6.0–8.3)

## 2012-09-26 MED ORDER — IOHEXOL 300 MG/ML  SOLN
150.0000 mL | Freq: Once | INTRAMUSCULAR | Status: AC | PRN
  Administered 2012-09-26: 600 mL via INTRAVENOUS

## 2012-09-26 MED ORDER — ADULT MULTIVITAMIN LIQUID CH
5.0000 mL | Freq: Every day | ORAL | Status: DC
  Administered 2012-09-26 – 2012-09-29 (×4): 5 mL via ORAL
  Filled 2012-09-26 (×4): qty 5

## 2012-09-26 MED ORDER — SODIUM CHLORIDE 0.9 % IV SOLN
INTRAVENOUS | Status: DC
  Administered 2012-09-26 (×2): 125 mL/h via INTRAVENOUS
  Administered 2012-09-27: 03:00:00 via INTRAVENOUS
  Administered 2012-09-27: 125 mL/h via INTRAVENOUS
  Administered 2012-09-28 (×2): via INTRAVENOUS

## 2012-09-26 NOTE — Progress Notes (Signed)
  09/26/2012, 10:48 AM  Hospital day: 4 Antibiotics: none Chemotherapy: day 1 cycle 1 weekly taxol given 09-21-12.    Subjective: Multiple episodes of liquid diarrhea thru night "dark color" and again diarrhea now. Diarrhea began on 09-24-12, after admission on 09-23-12, seems to occur shortly after any po intake, with abdominal pain associated with the bowel movements. She feels thirsty.  Objective: Vital signs in last 24 hours: Blood pressure 110/58, pulse 106, temperature 99.6 F (37.6 C), temperature source Oral, resp. rate 18, height 5\' 3"  (1.6 m), weight 158 lb 6 oz (71.838 kg), SpO2 96.00%.   Intake/Output from previous day: 03/16 0701 - 03/17 0700 In: 2815 [P.O.:890; I.V.:1875; IV Piggyback:50] Out: 725 [Urine:725] Intake/Output this shift:    Physical exam: awake and alert, asking for ice chips. Looks weak, fatigued just getting to BR with assistance. PERRL. Oral mucosa dry without lesions. IV ordered at [facility], tho infusing via PAC at 75 now. Heart RRR. Lungs without wheezes or rales. Abdomen soft, not distended, not tender to gentle exam, some bowel sounds. LE no edema, cords, tenderness. PAC site fine.  Lab Results:  Recent Labs  09/24/12 0511 09/25/12 0500  WBC 7.7 7.9  HGB 10.6* 9.2*  HCT 31.4* 27.7*  PLT 177 182   BMET  Recent Labs  09/24/12 0511 09/25/12 0500  NA 133* 134*  K 3.3* 3.9  CL 100 102  CO2 22 25  GLUCOSE 140* 207*  BUN 23 16  CREATININE 0.82 0.58  CALCIUM 8.1* 8.8   CBC diff and CMET ordered now.] Stool for C diff by PCR ordered now.  Studies/Results: No results found.  I have LM for gyn oncology re admission, tho I have not spoken directly with MD.  Phone for gyn onc office at [facility] is [phone] and otherwise [facility] [phone]  Assessment/Plan: 1. Acute onset of severe abdominal pain 09-23-12, in patient who is post TAH/BSO/ infragastric omentectomy and partial resection of sigmoid colon with ileotransverse loop enterostomy 07-26-12, by Dr  Laurette Schimke for known gyn carcinoma and synchronous colon cancer which was not known prior to that surgery. General surgery following. Now with diarrhea:  I have ordered C diff testing and FOB. 2.IIIB high grade serous ovarian cancer: post neoadjuvant chemotherapy Aug - Dec 2013, complete debulking 07-26-2012, and day1 cycle 1 weekly taxol given 09-21-12. Hold further chemo until abdominal problem resolved (due day 8 on 3-19 and day 15 on 10-05-12). I do not expect that counts will drop significantly from the single agent taxol.  3. Stage III colon carcinoma: found unexpectedly at surgery for gyn carcinoma. Information as above.  4.Diabetes: on coverage. Note decadron stopped now as this was only premed for chemo.  5.PAC in  6.acute renal failure, multifactorial shortly after presentation with cancer in summer 2013. With diarrhea I have increased IVF to 125/ hr, tho this may need to be adjusted when chemistries result. 7.anemia: multifactorial with GI blood loss, chemo, recent surgery. Had been on oral iron, but likely will do better with IV feraheme if iron studies still low (ordered now).     Jacson Rapaport P [phone]

## 2012-09-26 NOTE — Progress Notes (Signed)
Patient ID: Ann Baldwin, female   DOB: 16-Apr-1946, 67 y.o.   MRN: [id number]    Subjective: Some increased pain overnight, still with lots of liquid stools, denies vomiting, pain and nausea developed after having clears  Objective: Vital signs in last 24 hours: Temp:  [98.4 F (36.9 C)-99.6 F (37.6 C)] 99.6 F (37.6 C) (03/17 0505) Pulse Rate:  [105-106] 106 (03/17 0505) Resp:  [16-18] 18 (03/17 0505) BP: (110-127)/(53-73) 110/58 mmHg (03/17 0505) SpO2:  [96 %-100 %] 96 % (03/17 0505) Last BM Date: 09/26/12  Intake/Output from previous day: 03/16 0701 - 03/17 0700 In: 2815 [P.O.:890; I.V.:1875; IV Piggyback:50] Out: 725 [Urine:725] Intake/Output this shift:    PE: Abd: mildly distended, +bs (hyperactvie), mildly tender General: awake, alert, NAD  Lab Results:   Recent Labs  09/24/12 0511 09/25/12 0500  WBC 7.7 7.9  HGB 10.6* 9.2*  HCT 31.4* 27.7*  PLT 177 182   BMET  Recent Labs  09/24/12 0511 09/25/12 0500  NA 133* 134*  K 3.3* 3.9  CL 100 102  CO2 22 25  GLUCOSE 140* 207*  BUN 23 16  CREATININE 0.82 0.58  CALCIUM 8.1* 8.8   PT/INR  Recent Labs  09/24/12 0511  LABPROT 16.6*  INR 1.38   CMP     Component Value Date/Time   NA 134* 09/25/2012 0500   NA 141 09/14/2012 1548   K 3.9 09/25/2012 0500   K 3.8 09/14/2012 1548   CL 102 09/25/2012 0500   CL 104 09/14/2012 1548   CO2 25 09/25/2012 0500   CO2 25 09/14/2012 1548   GLUCOSE 207* 09/25/2012 0500   GLUCOSE 110* 09/14/2012 1548   BUN 16 09/25/2012 0500   BUN 15.2 09/14/2012 1548   CREATININE 0.58 09/25/2012 0500   CREATININE 0.8 09/14/2012 1548   CALCIUM 8.8 09/25/2012 0500   CALCIUM 10.0 09/14/2012 1548   PROT 6.1 09/23/2012 1427   PROT 7.2 09/14/2012 1548   ALBUMIN 3.5 09/23/2012 1427   ALBUMIN 3.7 09/14/2012 1548   AST 11 09/23/2012 1427   AST 16 09/14/2012 1548   ALT 14 09/23/2012 1427   ALT 19 09/14/2012 1548   ALKPHOS 56 09/23/2012 1427   ALKPHOS 75 09/14/2012 1548   BILITOT 1.1 09/23/2012 1427   BILITOT  0.33 09/14/2012 1548   GFRNONAA >90 09/25/2012 0500   GFRAA >90 09/25/2012 0500   Lipase     Component Value Date/Time   LIPASE 18 09/23/2012 1427       Studies/Results: No results found.  Anti-infectives: Anti-infectives   None       Assessment/Plan 1. SBO vs ileus: would back off her diet today given increase in symptoms, would contact the patients surgeon who did her surgery.   LOS: 3 days    Ann Baldwin, Ann Baldwin 09/26/2012

## 2012-09-26 NOTE — Care Management Note (Addendum)
    Page 1 of 1   09/29/2012     10:51:27 AM   CARE MANAGEMENT NOTE 09/29/2012  Patient:  Ann Baldwin, Ann Baldwin   Account Number:  [id number]  Date Initiated:  09/26/2012  Documentation initiated by:  Lanier Clam  Subjective/Objective Assessment:   ADMITTED W/N/V,ABD PAIN.EX:BMWUX CA,S/P COLECTOMY.RECEIVING CHEMO.READMIT.     Action/Plan:   FROM HOME W/SUPPORT.HAS PCP,ONCOLOGIST,PHARMACY.   Anticipated DC Date:  09/29/2012   Anticipated DC Plan:  HOME/SELF CARE      DC Planning Services  CM consult      Choice offered to / List presented to:             Status of service:  Completed, signed off Medicare Important Message given?   (If response is "NO", the following Medicare IM given date fields will be blank) Date Medicare IM given:   Date Additional Medicare IM given:    Discharge Disposition:  HOME/SELF CARE  Per UR Regulation:  Reviewed for med. necessity/level of care/duration of stay  If discussed at Long Length of Stay Meetings, dates discussed:   09/29/2012    Comments:  09/29/12 Nadalee Neiswender RN,BSN NCM [phone] D/C HOME.NO NEEDS OR ORDERS.  09/27/12 Georgia Baria RN,BSN NCM [phone] ABD PAIN-IV DILAUDID,CT ABD.DIARRHEA.FULL LIQ DIET.SX/GYN ONC FOLLOWING.CHEMO ON HOLD.NOTED IF FURTHER SX NEEDED MAY TRANSFER TO [facility].  09/26/12 Shena Vinluan RN,BSN NCM [phone]

## 2012-09-26 NOTE — Progress Notes (Signed)
INITIAL NUTRITION ASSESSMENT  DOCUMENTATION CODES Per approved criteria  -Not Applicable   INTERVENTION: - Anti-diarrheals per MD, recommend Florastor to help with diarrhea - Diet advancement per MD - Multivitamin 5ml daily PO - Will continue to monitor    NUTRITION DIAGNOSIS: Altered GI function related to diarrhea as evidenced by pt statement, MD notes.   Goal: 1. Resolution of diarrhea 2. Advance diet as tolerated to diabetic diet  Monitor:  Weights, labs, diet advancement, diarrhea  Reason for Assessment: Consult  67 y.o. female  Admitting Dx: Partial small bowel obstruction  ASSESSMENT: Pt with history of diabetes, ovarian cancer, and colon cancer status post colectomy and hysterectomy 2 months ago, currently on chemotherapy, admitted with abdominal pain and vomiting. Pt has been seen by [facility] RD. Pt reports her stomach pain started Thursday PTA and is still persisting. Pt reports she began vomiting Friday PTA and that it has resolved and denies any nausea. Pt reports before then she had a good appetite and was eating great - 3 meals/day with snacks and 1 Ensure Plus daily. Pt denies eating any foods out of the usual recently. Pt started having diarrhea 3/15 and reports having a "bunch" so far today. Pt has lost 16 pounds unintentionally since August 2013.    Height: Ht Readings from Last 1 Encounters:  09/23/12 5\' 3"  (1.6 m)    Weight: Wt Readings from Last 1 Encounters:  09/23/12 158 lb 6 oz (71.838 kg)    Ideal Body Weight: 115 lb  % Ideal Body Weight: 137  Wt Readings from Last 10 Encounters:  09/23/12 158 lb 6 oz (71.838 kg)  09/20/12 154 lb (69.854 kg)  09/15/12 154 lb 3.2 oz (69.945 kg)  09/14/12 154 lb 1.6 oz (69.899 kg)  08/22/12 153 lb 14.1 oz (69.8 kg)  08/18/12 152 lb 6.4 oz (69.128 kg)  07/26/12 159 lb 9.6 oz (72.394 kg)  07/26/12 159 lb 9.6 oz (72.394 kg)  07/22/12 159 lb 9.6 oz (72.394 kg)  07/19/12 160 lb 4.8 oz (72.712  kg)    Usual Body Weight: 174 lb in August 2013  % Usual Body Weight: 91  BMI:  Body mass index is 28.06 kg/(m^2).  Estimated Nutritional Needs: Kcal: 1450-1600 Protein: 50-60g Fluid: 1.4-1.6L/day  Skin: Intact  Diet Order: Clear Liquid  EDUCATION NEEDS: -No education needs identified at this time   Intake/Output Summary (Last 24 hours) at 09/26/12 1614 Last data filed at 09/26/12 0700  Gross per 24 hour  Intake   1805 ml  Output    525 ml  Net   1280 ml    Last BM: 3/17, multiple loose brown watery  Labs:   Recent Labs Lab 09/24/12 0511 09/25/12 0500 09/26/12 1110  NA 133* 134* 130*  K 3.3* 3.9 3.7  CL 100 102 99  CO2 22 25 22   BUN 23 16 9   CREATININE 0.82 0.58 0.69  CALCIUM 8.1* 8.8 8.7  GLUCOSE 140* 207* 152*    CBG (last 3)   Recent Labs  09/26/12 0415 09/26/12 0804 09/26/12 1136  GLUCAP 135* 145* 140*    Scheduled Meds: . enoxaparin (LOVENOX) injection  40 mg Subcutaneous Q24H  . insulin aspart  0-9 Units Subcutaneous Q4H  . sodium chloride  3 mL Intravenous Q12H    Continuous Infusions: . sodium chloride 125 mL/hr (09/26/12 1108)  . dextrose 5 % and 0.45 % NaCl with KCl 20 mEq/L 75 mL/hr at 09/26/12 1610    Past Medical History  Diagnosis Date  . Diabetes mellitus   . Hyperlipidemia   . Ovarian cancer   . Hypertension     no current meds for since aug 2013  . Anemia   . Colon cancer     Past Surgical History  Procedure Laterality Date  . No past surgeries    . Laparotomy  07/26/2012    Procedure: EXPLORATORY LAPAROTOMY;  Surgeon: Laurette Schimke, MD PHD;  Location: [facility];  Service: Gynecology;  Laterality: N/A;  Exploratory Laporatomy/Total Abdominal Hysterectomy Bilatderal Salpingo Oopherectomy/Tumor Debulking  . Abdominal hysterectomy  07/26/2012    Procedure: HYSTERECTOMY ABDOMINAL;  Surgeon: Laurette Schimke, MD PHD;  Location: [facility];  Service: Gynecology;;  . Salpingoophorectomy  07/26/2012    Procedure: SALPINGO  OOPHORECTOMY;  Surgeon: Laurette Schimke, MD PHD;  Location: [facility];  Service: Gynecology;  Laterality: Bilateral;  . Omentectomy  07/26/2012    Procedure: OMENTECTOMY;  Surgeon: Laurette Schimke, MD PHD;  Location: [facility];  Service: Gynecology;;  . Colostomy revision  07/26/2012    Procedure: COLON RESECTION RIGHT;  Surgeon: Laurette Schimke, MD PHD;  Location: [facility];  Service: Gynecology;;  with Jodie Echevaria MS, RD, LDN [phone] Pager [phone] After Hours Pager

## 2012-09-26 NOTE — Progress Notes (Signed)
Patient ID: Ann Baldwin, female   DOB: 11-06-1945, 67 y.o.   MRN: [id number]  General Surgery [facility]  Patient seen and examined.  Abdomen with mild distension, non-tender.  Patient complains of mid abdominal discomfort after eating for past 5 days.  Denies nausea or emesis.  CT abdomen with abnormalities around ileocolonic anastomosis in right mid abdomen.  Will allow clear liquid diet.  Will order barium enema to better evaluate anatomy of anastomosis and rule out obstruction at that point.  Velora Heckler, MD, [facility] Office: [phone]

## 2012-09-26 NOTE — Progress Notes (Signed)
GYN Oncology Follow Up  Subjective: Patient reports being admitted on Friday for severe abdominal pain and nausea/vomiting.  Today, she denies nausea and vomiting.  Tolerating ice chips.  Reports that abdominal pain begins after eating, including liquids, with relief on defecation.  Reporting loose stools after eating.  Passing flatus.  Objective: Vital signs in last 24 hours: Temp:  [98.4 F (36.9 C)-99.6 F (37.6 C)] 99.6 F (37.6 C) (03/17 0505) Pulse Rate:  [105-106] 106 (03/17 0505) Resp:  [16-18] 18 (03/17 0505) BP: (110-127)/(53-73) 110/58 mmHg (03/17 0505) SpO2:  [96 %-100 %] 96 % (03/17 0505) Last BM Date: 09/26/12  Intake/Output from previous day: 03/16 0701 - 03/17 0700 In: 2815 [P.O.:890; I.V.:1875; IV Piggyback:50] Out: 725 [Urine:725]  Physical Examination: General: alert, cooperative and no distress Resp: clear to auscultation bilaterally Cardio: tachycardic at times, regular rhythm, no murmur, rub, or click GI: soft, non-tender; bowel sounds normal; no masses,  no organomegaly Extremities: extremities normal, atraumatic, no cyanosis or edema  Labs: WBC/Hgb/Hct/Plts:  7.7/9.4/27.9/204 (03/17 1110) BUN/Cr/glu/ALT/AST/amyl/lip:  9/0.69/--/9/9/--/-- (03/17 1110)  Assessment: 67 y.o. s/p : stable Pain:  Pain is well-controlled on PRN medications.  Heme:  Hgb 9.4 and Hct 27.9 stable this am.  Continue to monitor.  ID:  WBC 7.7 this am.  To test C Diff.  CV:  BP and HR stable.  Hx HTN.  Continue to monitor.  GI:  Tolerating po: No: increased abdominal pain with PO intake.     FEN: Na + 130 this am.  Endo:  Sliding scale ordered.  CBG:  CBG (last 3)   Recent Labs  09/26/12 0415 09/26/12 0804 09/26/12 1136  GLUCAP 135* 145* 140*    Prophylaxis:  Lovenox ordered.    Plan: Dietician consult Maintain diet status of NPO except ice chips Advance diet per General Surgery recommendation Daily weights Daily lab work Continue plan of care   LOS: 3 days     Jammi Morrissette DEAL 09/26/2012, 12:09 PM

## 2012-09-27 DIAGNOSIS — R197 Diarrhea, unspecified: Secondary | ICD-10-CM

## 2012-09-27 LAB — CBC WITH DIFFERENTIAL/PLATELET
Basophils Absolute: 0 10*3/uL (ref 0.0–0.1)
Basophils Relative: 0 % (ref 0–1)
HCT: 27 % — ABNORMAL LOW (ref 36.0–46.0)
Hemoglobin: 9.1 g/dL — ABNORMAL LOW (ref 12.0–15.0)
Lymphocytes Relative: 9 % — ABNORMAL LOW (ref 12–46)
MCHC: 33.7 g/dL (ref 30.0–36.0)
Monocytes Absolute: 0.4 10*3/uL (ref 0.1–1.0)
Neutro Abs: 4.9 10*3/uL (ref 1.7–7.7)
Neutrophils Relative %: 83 % — ABNORMAL HIGH (ref 43–77)
RDW: 17.3 % — ABNORMAL HIGH (ref 11.5–15.5)
WBC: 5.9 10*3/uL (ref 4.0–10.5)

## 2012-09-27 LAB — BASIC METABOLIC PANEL
CO2: 22 mEq/L (ref 19–32)
Chloride: 102 mEq/L (ref 96–112)
Creatinine, Ser: 0.63 mg/dL (ref 0.50–1.10)
GFR calc Af Amer: >90 mL/min (ref >90)
Potassium: 3.5 mEq/L (ref 3.5–5.1)

## 2012-09-27 LAB — GLUCOSE, CAPILLARY
Glucose-Capillary: 114 mg/dL — ABNORMAL HIGH (ref 70–99)
Glucose-Capillary: 173 mg/dL — ABNORMAL HIGH (ref 70–99)
Glucose-Capillary: 98 mg/dL (ref 70–99)

## 2012-09-27 LAB — PROCALCITONIN: Procalcitonin: 0.74 ng/mL

## 2012-09-27 MED ORDER — SODIUM CHLORIDE 0.9 % IV SOLN
1020.0000 mg | Freq: Once | INTRAVENOUS | Status: AC
  Administered 2012-09-27: 1020 mg via INTRAVENOUS
  Filled 2012-09-27: qty 34

## 2012-09-27 NOTE — Progress Notes (Signed)
Medical Oncology  Hospital day 5 Day 1 Cycle 1 weekly taxol given 09-21-12  Awake and alert. No nausea this am, tolerating clear liquids. Has watery stool reportedly dark "every time I move". Only mild mid abdominal pain now. Denies SOB. Tolerating up to BR with assistance.   Afebrile, VSS. PAC site ok. Respirations not labored RA. Abdomen soft, not distended, few BS. LE no edema, cords, SCD on. Lungs without wheezes or rales, Heart RRR no gallop  C.diff negative. Hemoccult negative x1 CBC stable, no neutropenia or thrombocytopenia, Hgb a little lower at 9.1 Last protein and albumin from 09-26-12 5.5 and 2.2. Had lost  weight with surgery and with chemo prior. BMET today with Na 133, glu 112, creat 0.6 Iron studies low from 09-25-12, serum iron 14 and %sat 7.  Discussed IV iron and patient agrees.  Radiology contrast enema study done, report pending.  Assessment/Plan:  1. Acute onset of severe abdominal pain 09-23-12, in patient who is post TAH/BSO/ infragastric omentectomy and partial resection of sigmoid colon with ileotransverse loop enterostomy 07-26-12, by Dr Laurette Schimke for known gyn carcinoma and synchronous colon cancer which was not known prior to that surgery. General surgery following. Continues liquid stools, C.Diff negative, hemoccult negative x1 on 09-26-12 2.IIIB high grade serous ovarian cancer: post neoadjuvant chemotherapy Aug - Dec 2013, complete debulking 07-26-2012, and day1 cycle 1 weekly taxol given 09-21-12. Hold further chemo until abdominal problem resolved (due day 8 on 3-19 and day 15 on 10-05-12). I do not expect that counts will drop significantly from the single agent taxol.  3. Stage III colon carcinoma: found unexpectedly at surgery for gyn carcinoma. Marland Kitchen  4.Diabetes: on coverage. 5.PAC in  6.acute renal failure, multifactorial shortly after presentation with cancer in summer 2013. Creatinine stable in good range now. Continues IVF 7.anemia: multifactorial with GI  blood loss, chemo, recent surgery. Still very iron deficient despite attempting oral iron. She agrees to feraheme, will give full dose IV today. 8. Poor nutritional status  L.Darrold Span, MD

## 2012-09-27 NOTE — H&P (Signed)
Agree with treatment plan.

## 2012-09-27 NOTE — Progress Notes (Signed)
  Subjective: Complains of diarrhea now.  Says it's uncontrollable. Tolerating clears, so we will advance her diet.    Objective: Vital signs in last 24 hours: Temp:  [99 F (37.2 C)] 99 F (37.2 C) (03/18 0500) Pulse Rate:  [102-113] 103 (03/18 0500) Resp:  [18] 18 (03/18 0500) BP: (113-140)/(57-64) 127/62 mmHg (03/18 0500) SpO2:  [96 %] 96 % (03/18 0500) Weight:  [162 lb 11.2 oz (73.8 kg)] 162 lb 11.2 oz (73.8 kg) (03/18 0500) Last BM Date: 09/26/12  820 Po recorded, clear liquid diet, tm 99.6, VSS, labs OK, films yesterday report still not final but obstruction not seen distally  Intake/Output from previous day: 03/17 0701 - 03/18 0700 In: 3757.5 [P.O.:820; I.V.:2937.5] Out: 1050 [Urine:1050] Intake/Output this shift:    General appearance: alert, cooperative and no distress GI: soft, non-tender; bowel sounds normal; no masses,  no organomegaly and incisions well healed  Lab Results:   Recent Labs  09/26/12 1110 09/27/12 0545  WBC 7.7 5.9  HGB 9.4* 9.1*  HCT 27.9* 27.0*  PLT 204 197    BMET  Recent Labs  09/26/12 1110 09/27/12 0545  NA 130* 133*  K 3.7 3.5  CL 99 102  CO2 22 22  GLUCOSE 152* 112*  BUN 9 7  CREATININE 0.69 0.63  CALCIUM 8.7 8.3*   PT/INR No results found for this basename: LABPROT, INR,  in the last 72 hours   Recent Labs Lab 09/23/12 1427 09/26/12 1110  AST 11 9  ALT 14 9  ALKPHOS 56 65  BILITOT 1.1 0.6  PROT 6.1 5.5*  ALBUMIN 3.5 2.2*     Lipase     Component Value Date/Time   LIPASE 18 09/23/2012 1427     Studies/Results: No results found.  Medications: . enoxaparin (LOVENOX) injection  40 mg Subcutaneous Q24H  . ferumoxytol  1,020 mg Intravenous Once  . insulin aspart  0-9 Units Subcutaneous Q4H  . multivitamin  5 mL Oral Daily  . sodium chloride  3 mL Intravenous Q12H    Assessment/Plan . SBO vs ileus;  s/p TAH/BSO/ infragastric omentectomy and partial resection of sigmoid colon with ileotransverse loop  enterostomy 07-26-12, by Dr Laurette Schimke for known gyn carcinoma and synchronous colon cancer which was not known prior to that surgery.  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 Diabetes acute renal failure Anemia, multifactorial  Plan:  Advance her diet and see how she does.     LOS: 4 days    Haylen Bellotti 09/27/2012

## 2012-09-27 NOTE — Progress Notes (Signed)
  Subjective: Patient reports tolerating full liquids with no nausea or emesis.  Passing flatus.  Reporting intermittent, mild abdominal cramping when eating sometimes but nothing like the pain she was having when she was admitted.     Objective: Vital signs in last 24 hours: Temp:  [98 F (36.7 C)-99 F (37.2 C)] 98 F (36.7 C) (03/18 1300) Pulse Rate:  [89-113] 89 (03/18 1300) Resp:  [16-18] 16 (03/18 1300) BP: (127-140)/(62-70) 129/70 mmHg (03/18 1300) SpO2:  [96 %-97 %] 97 % (03/18 1300) Weight:  [162 lb 11.2 oz (73.8 kg)] 162 lb 11.2 oz (73.8 kg) (03/18 0500) Last BM Date: 09/26/12  Intake/Output from previous day: 03/17 0701 - 03/18 0700 In: 3757.5 [P.O.:820; I.V.:2937.5] Out: 1050 [Urine:1050]  Physical Examination: General: alert, cooperative and no distress Resp: clear to auscultation bilaterally Cardio: regular rate and rhythm, S1, S2 normal, no murmur, click, rub or gallop GI: soft, non-tender; bowel sounds normal; no masses,  no organomegaly Extremities: extremities normal, atraumatic, no cyanosis or edema  Labs: WBC/Hgb/Hct/Plts:  5.9/9.1/27.0/197 (03/18 0545) BUN/Cr/glu/ALT/AST/amyl/lip:  7/0.63/--/--/--/--/-- (03/18 0545)  Assessment: 67 y.o. s/p : stable Pain:  Pain is well-controlled on PRN medications.  Heme:  Hgb 9.1 and Hct 27.0.  Stable at this time.  ID:  C diff negative.  WBC 5.9 this am.  CV: BP and HR stable at this time.  Tachy at times. Continue to monitor.  GI:  Tolerating po: Yes     FEN:  Na+ 133 this am from 130 yesterday.  Endo: Diabetes mellitus Type II, under good control..  CBG:  CBG (last 3)   Recent Labs  09/27/12 0830 09/27/12 1141 09/27/12 1532  GLUCAP 115* 156* 173*    Prophylaxis:  Lovenox ordered.  Plan: Continue diet advancement per general surgery Encourage ambulation Continue plan of care Monitor vitals and labs   LOS: 4 days    Ann Baldwin DEAL 09/27/2012, 3:46 PM

## 2012-09-27 NOTE — Progress Notes (Signed)
General Surgery [facility]  Patient seen and examined.  Discussed results of barium enema study yesterday.  I reviewed at length with radiologist.  Appears to have had proximal transverse colon resected.  Unusual anastomosis to small bowel.  Appears all bowel able to drain distally and no evidence of obstruction or perforation.  Will advance diet to full liquids today.  Discussed with Dr. Tamela Oddi yesterday.  If symptoms persist and surgical intervention required, Dr. Nelly Rout would like patient to come to [facility]) for procedure if necessary.  Will continue to follow with you.  Velora Heckler, MD, [facility] Office: [phone]

## 2012-09-28 ENCOUNTER — Ambulatory Visit: Payer: Medicare Other

## 2012-09-28 ENCOUNTER — Other Ambulatory Visit: Payer: Medicare Other | Admitting: Lab

## 2012-09-28 LAB — BASIC METABOLIC PANEL WITH GFR
BUN: 4 mg/dL — ABNORMAL LOW (ref 6–23)
CO2: 24 meq/L (ref 19–32)
Calcium: 8.8 mg/dL (ref 8.4–10.5)
Chloride: 105 meq/L (ref 96–112)
Creatinine, Ser: 0.57 mg/dL (ref 0.50–1.10)
GFR calc Af Amer: >90 mL/min
GFR calc non Af Amer: >90 mL/min
Glucose, Bld: 105 mg/dL — ABNORMAL HIGH (ref 70–99)
Potassium: 3.2 meq/L — ABNORMAL LOW (ref 3.5–5.1)
Sodium: 136 meq/L (ref 135–145)

## 2012-09-28 LAB — CBC WITH DIFFERENTIAL/PLATELET
Basophils Absolute: 0 10*3/uL (ref 0.0–0.1)
Basophils Absolute: 0 10*3/uL (ref 0.0–0.1)
Basophils Relative: 1 % (ref 0–1)
Eosinophils Absolute: 0.1 10*3/uL (ref 0.0–0.7)
Eosinophils Absolute: 0.1 10*3/uL (ref 0.0–0.7)
Eosinophils Relative: 4 % (ref 0–5)
HCT: 23.4 % — ABNORMAL LOW (ref 36.0–46.0)
HCT: 25.2 % — ABNORMAL LOW (ref 36.0–46.0)
Hemoglobin: 8.3 g/dL — ABNORMAL LOW (ref 12.0–15.0)
Lymphocytes Relative: 24 % (ref 12–46)
Lymphs Abs: 0.7 10*3/uL (ref 0.7–4.0)
Lymphs Abs: 0.8 10*3/uL (ref 0.7–4.0)
MCH: 32.4 pg (ref 26.0–34.0)
MCH: 31.9 pg (ref 26.0–34.0)
MCHC: 33.8 g/dL (ref 30.0–36.0)
MCHC: 32.9 g/dL (ref 30.0–36.0)
MCV: 95.9 fL (ref 78.0–100.0)
MCV: 96.9 fL (ref 78.0–100.0)
Monocytes Absolute: 0.6 10*3/uL (ref 0.1–1.0)
Monocytes Absolute: 0.6 10*3/uL (ref 0.1–1.0)
Monocytes Relative: 18 % — ABNORMAL HIGH (ref 3–12)
Neutro Abs: 2.4 10*3/uL (ref 1.7–7.7)
Neutro Abs: 2 10*3/uL (ref 1.7–7.7)
Neutrophils Relative %: 53 % (ref 43–77)
Platelets: 248 10*3/uL (ref 150–400)
RBC: 2.6 MIL/uL — ABNORMAL LOW (ref 3.87–5.11)
RDW: 17.3 % — ABNORMAL HIGH (ref 11.5–15.5)
RDW: 17.1 % — ABNORMAL HIGH (ref 11.5–15.5)
WBC: 3.5 10*3/uL — ABNORMAL LOW (ref 4.0–10.5)

## 2012-09-28 LAB — GLUCOSE, CAPILLARY
Glucose-Capillary: 122 mg/dL — ABNORMAL HIGH (ref 70–99)
Glucose-Capillary: 112 mg/dL — ABNORMAL HIGH (ref 70–99)
Glucose-Capillary: 108 mg/dL — ABNORMAL HIGH (ref 70–99)

## 2012-09-28 MED ORDER — ACETAMINOPHEN 325 MG PO TABS
325.0000 mg | ORAL_TABLET | Freq: Once | ORAL | Status: AC
  Administered 2012-09-28: 325 mg via ORAL
  Filled 2012-09-28: qty 2

## 2012-09-28 MED ORDER — POTASSIUM CHLORIDE CRYS ER 20 MEQ PO TBCR
20.0000 meq | EXTENDED_RELEASE_TABLET | Freq: Two times a day (BID) | ORAL | Status: DC
  Administered 2012-09-28 – 2012-09-29 (×3): 20 meq via ORAL
  Filled 2012-09-28 (×4): qty 1

## 2012-09-28 MED ORDER — INSULIN ASPART 100 UNIT/ML ~~LOC~~ SOLN
0.0000 [IU] | Freq: Three times a day (TID) | SUBCUTANEOUS | Status: DC
  Administered 2012-09-29: 1 [IU] via SUBCUTANEOUS
  Administered 2012-09-29: 2 [IU] via SUBCUTANEOUS

## 2012-09-28 MED ORDER — ACYCLOVIR 5 % EX OINT
TOPICAL_OINTMENT | CUTANEOUS | Status: DC
  Administered 2012-09-28 (×3): via TOPICAL
  Administered 2012-09-28: 1 via TOPICAL
  Administered 2012-09-28 – 2012-09-29 (×5): via TOPICAL
  Filled 2012-09-28: qty 30

## 2012-09-28 MED ORDER — ACYCLOVIR 200 MG PO CAPS
200.0000 mg | ORAL_CAPSULE | Freq: Three times a day (TID) | ORAL | Status: DC
  Administered 2012-09-28 – 2012-09-29 (×4): 200 mg via ORAL
  Filled 2012-09-28 (×6): qty 1

## 2012-09-28 MED ORDER — ENSURE COMPLETE PO LIQD
237.0000 mL | Freq: Three times a day (TID) | ORAL | Status: DC
  Administered 2012-09-28 – 2012-09-29 (×3): 237 mL via ORAL

## 2012-09-28 MED ORDER — GLUCERNA 1.2 CAL PO LIQD: 1000.0000 mL | ORAL | Status: DC

## 2012-09-28 NOTE — Progress Notes (Signed)
Repeat Hgb of 8.3 called to Dr. Darrold Span. Maeola Harman

## 2012-09-28 NOTE — Progress Notes (Signed)
GYN Oncology Follow Up  Subjective: Patient reports tolerating full liquids with no nausea or emesis.  Passing flatus.  Reporting improvement in mild abdominal cramping when eating.  Stating that the "diarrhea finally stopped yesterday afternoon."    Objective: Vital signs in last 24 hours: Temp:  [98 F (36.7 C)-99 F (37.2 C)] 98.3 F (36.8 C) (03/19 0429) Pulse Rate:  [89-99] 95 (03/19 0429) Resp:  [16] 16 (03/19 0429) BP: (129-143)/(65-74) 143/65 mmHg (03/19 0429) SpO2:  [94 %-97 %] 97 % (03/19 0429) Weight:  [161 lb 6 oz (73.2 kg)] 161 lb 6 oz (73.2 kg) (03/19 0429) Last BM Date: 09/27/12  Intake/Output from previous day: 03/18 0701 - 03/19 0700 In: 2345 [P.O.:220; I.V.:2125] Out: 950 [Urine:950]  Physical Examination: General: alert, cooperative and no distress Resp: clear to auscultation bilaterally Cardio: regular rate and rhythm, S1, S2 normal, no murmur, click, rub or gallop GI: soft, non-tender; bowel sounds normal; no masses,  no organomegaly Extremities: extremities normal, atraumatic, no cyanosis or edema  Labs: WBC/Hgb/Hct/Plts:  3.8/7.9/23.4/218 (03/19 0415) BUN/Cr/glu/ALT/AST/amyl/lip:  4/0.57/--/--/--/--/-- (03/19 0415)  Assessment: 67 y.o. s/p : stable Pain:  Pain is well-controlled on PRN medications.  Heme:  Hgb 7.9 and Hct 23.4 this am.  Plan to repeat.  ID:  C diff negative.  WBC 3.8 this am.  CV: BP and HR stable at this time.  Tachy at times. Continue to monitor.  GI:  Tolerating po: Yes.  Passing flatus.     FEN:  K+ 3.2 this am.  Endo: Diabetes mellitus Type II, under good control..  CBG:  CBG (last 3)   Recent Labs  09/28/12 0028 09/28/12 0417 09/28/12 0806  GLUCAP 155* 107* 122*    Prophylaxis:  Lovenox ordered and PAS hose.  Plan: Continue diet advancement per general surgery Saline lock IV CBG AC/HS Replace K+ Encourage ambulation Repeat CBC this am per Dr. Darrold Span Continue plan of care Monitor vitals and labs   LOS:  5 days    CROSS, MELISSA DEAL 09/28/2012, 9:56 AM

## 2012-09-28 NOTE — Progress Notes (Signed)
Patient ID: Ann Baldwin, female   DOB: 05/05/1946, 67 y.o.   MRN: [id number]  General Surgery - [facility] - Progress Note  Subjective: Patient without complaint this AM.  BM more formed this morning.  Wants to try regular food.  Objective: Vital signs in last 24 hours: Temp:  [98 F (36.7 C)-99 F (37.2 C)] 98.3 F (36.8 C) (03/19 0429) Pulse Rate:  [89-99] 95 (03/19 0429) Resp:  [16] 16 (03/19 0429) BP: (129-143)/(65-74) 143/65 mmHg (03/19 0429) SpO2:  [94 %-97 %] 97 % (03/19 0429) Weight:  [161 lb 6 oz (73.2 kg)] 161 lb 6 oz (73.2 kg) (03/19 0429) Last BM Date: 09/27/12  Intake/Output from previous day: 03/18 0701 - 03/19 0700 In: 2345 [P.O.:220; I.V.:2125] Out: 950 [Urine:950]  Exam: HEENT - clear, not icteric Neck - soft Chest - clear bilaterally Cor - RRR, no murmur Abd - mild distension - stable; BS present; non-tender Ext - no significant edema Neuro - grossly intact, no focal deficits  Lab Results:   Recent Labs  09/28/12 0415 09/28/12 1105  WBC 3.8* 3.5*  HGB 7.9* 8.3*  HCT 23.4* 25.2*  PLT 218 248     Recent Labs  09/27/12 0545 09/28/12 0415  NA 133* 136  K 3.5 3.2*  CL 102 105  CO2 22 24  GLUCOSE 112* 105*  BUN 7 4*  CREATININE 0.63 0.57  CALCIUM 8.3* 8.8    Studies/Results: Dg Colon W/water Sol Cm  09/27/2012  *RADIOLOGY REPORT*  Clinical Data: 67 year old female status post partial colectomy in January.  Initial postoperative course uncomplicated.  Now with worsening abdominal pain and vomiting.  Surgery reportedly for ovarian tumor and colon tumor.  Undergoing chemotherapy.  WATER SOLUBLE CONTRAST ENEMA  Technique:  Initial scout AP supine abdominal image was obtained. Water soluble contrast was introduced into the colon in a retrograde fashion and refluxed from the rectum to the cecum.  Spot images of the colon followed by overhead radiographs were obtained.  Fluoroscopy time: 3.75 minutes.  Comparison:  CT abdomen  pelvis 09/23/2012.  Findings:  A single contrast water soluble enema was performed and the patient tolerated this well and without difficulty.  No obstruction to the retrograde flow of contrast through the distal colon.  The sigmoid is non redundant.  The left colon is within normal limits.  The splenic flexure is mildly redundant.  The transverse colon is very redundant, and extends down into the upper pelvis.  One contrast reached the transverse colon located in the right lower quadrant, reflux of contrast into small bowel in the inferior right pelvis became apparent.  At the same time, some contrast also simultaneously ascended toward the hepatic flexure. A fairly conventional appearing hepatic flexure was ultimately opacified.  Contrast then descended in what appeared to be the right colon. At about the same time, additional small bowel loops were filled in a retrograde fashion leading to another:  Light segment in the right lower quadrant, which filled and most likely represents the unfilled segment of bowel on the comparison CT.  Additional filling of unremarkable appearing small bowel loops in the pelvis occurred.  On subsequent overhead radiographs there is possible filling of the appendix.  Post evacuation demonstrate fairly good evacuation of contrast throughout the colon segments.  Small bowel contrast in the right lower quadrant and pelvis persists.  IMPRESSION: Bowel anastomosis in the right lower quadrant as detailed above. No obstruction to the retrograde flow of contrast to the distal small  bowel, and fairly good evacuation of contrast from the large bowel.  Study reviewed in person with Dr. Gerrit Friends at 1640 hours on 09/26/2012.   Original Report Authenticated By: Erskine Speed, M.D.     Assessment / Plan: 1.  Ovarian Ca / Colonic Ca  I have review barium enema study results with Dr. Tamela Oddi and with the patient.  No reason not to try to advance to regular diet.  No sign of leak or  obstruction.  Does not require urgent operative intervention.  If symptoms persist, may require re-exploration.  Would recommend doing this in [place] with combined procedure of Gyn-Onc and GI surgery services.  Patient understands and agrees with this plan.  Will advance diet.  Will sign off - call if needed.  Velora Heckler, MD, [facility] Office: [phone]  09/28/2012

## 2012-09-28 NOTE — Progress Notes (Signed)
09/28/2012, 8:59 AM  Hospital day: 6 Acyclovir day 1 Chemotherapy: Day 1 Cycle 1 weekly taxol given 09-21-12  Patient seen with RN at bedside; discussed also with Dr Holley Dexter on unit.  Subjective: Feeling some better this AM. Diarrhea stopped yesterday, none overnight or this AM. Tolerating grits, yogurt; drinks Ensure at home and is willing to try supplement. No frank abdominal pain this AM. Has been OOB only to BR, not up in chair in >24 hours but will try today. No bleeding. Did sleep last pm. Lips uncomfortable. Tolerated full dose feraheme yesterday without difficulty. Objective: Vital signs in last 24 hours: Blood pressure 143/65, pulse 95, temperature 98.3 F (36.8 C), temperature source Oral, resp. rate 16, height 5\' 3"  (1.6 m), weight 161 lb 6 oz (73.2 kg), SpO2 97.00%.   Intake/Output from previous day: 03/18 0701 - 03/19 0700 In: 2345 [P.O.:220; I.V.:2125] Out: 950 [Urine:950] Intake/Output this shift: Total I/O In: -  Out: 175 [Urine:175]  Physical exam: awake and alert, looks brighter today, NAD supine in bed on RA. PAC site fine, tho IV beeping again. Oral mucosa slightly dry, with herpetic type ulcerations inner lower lip. Skin color ok. Lungs without wheezes or rales. Heart RRR. Abdomen soft, quiet, not distended, not tender to gentle palpation. LE no edema, cords, tenderness. Moves all extremities easily.  Lab Results:  Recent Labs  09/27/12 0545 09/28/12 0415  WBC 5.9 3.8*  HGB 9.1* 7.9*  HCT 27.0* 23.4*  PLT 197 218  Not clear that this AM CBC is accurate and will redraw. If she is this anemic, will need PRBCs. BMET  Recent Labs  09/27/12 0545 09/28/12 0415  NA 133* 136  K 3.5 3.2*  CL 102 105  CO2 22 24  GLUCOSE 112* 105*  BUN 7 4*  CREATININE 0.63 0.57  CALCIUM 8.3* 8.8    Studies/Results: Dg Colon W/water Sol Cm  09/27/2012  *RADIOLOGY REPORT*  Clinical Data: 67 year old female status post partial colectomy in January.  Initial  postoperative course uncomplicated.  Now with worsening abdominal pain and vomiting.  Surgery reportedly for ovarian tumor and colon tumor.  Undergoing chemotherapy.  WATER SOLUBLE CONTRAST ENEMA  Technique:  Initial scout AP supine abdominal image was obtained. Water soluble contrast was introduced into the colon in a retrograde fashion and refluxed from the rectum to the cecum.  Spot images of the colon followed by overhead radiographs were obtained.  Fluoroscopy time: 3.75 minutes.  Comparison:  CT abdomen pelvis 09/23/2012.  Findings:  A single contrast water soluble enema was performed and the patient tolerated this well and without difficulty.  No obstruction to the retrograde flow of contrast through the distal colon.  The sigmoid is non redundant.  The left colon is within normal limits.  The splenic flexure is mildly redundant.  The transverse colon is very redundant, and extends down into the upper pelvis.  One contrast reached the transverse colon located in the right lower quadrant, reflux of contrast into small bowel in the inferior right pelvis became apparent.  At the same time, some contrast also simultaneously ascended toward the hepatic flexure. A fairly conventional appearing hepatic flexure was ultimately opacified.  Contrast then descended in what appeared to be the right colon. At about the same time, additional small bowel loops were filled in a retrograde fashion leading to another:  Light segment in the right lower quadrant, which filled and most likely represents the unfilled segment of bowel on the comparison CT.  Additional filling of  unremarkable appearing small bowel loops in the pelvis occurred.  On subsequent overhead radiographs there is possible filling of the appendix.  Post evacuation demonstrate fairly good evacuation of contrast throughout the colon segments.  Small bowel contrast in the right lower quadrant and pelvis persists.  IMPRESSION: Bowel anastomosis in the right lower  quadrant as detailed above. No obstruction to the retrograde flow of contrast to the distal small bowel, and fairly good evacuation of contrast from the large bowel.  Study reviewed in person with Dr. Gerrit Friends at 1640 hours on 09/26/2012.   Original Report Authenticated By: Erskine Speed, M.D.      Assessment/Plan: 1. Acute onset of severe abdominal pain 09-23-12, in patient who is post TAH/BSO/ infragastric omentectomy and partial resection of sigmoid colon with ileotransverse loop enterostomy 07-26-12, by Dr Laurette Schimke for known gyn carcinoma and synchronous colon cancer which was not known prior to that surgery. General surgery following. Diarrhea improved in last 24 hours, C.Diff negative, hemoccult negative x1 on 09-26-12  2.IIIB high grade serous ovarian cancer: post neoadjuvant chemotherapy Aug - Dec 2013, complete debulking 07-26-2012, and day1 cycle 1 weekly taxol given 09-21-12. Hold further chemo until abdominal problem resolved (due day 8 on 3-19 and day 15 on 10-05-12). I do not expect that counts will drop significantly from the single agent taxol.  3. Stage III colon carcinoma: found unexpectedly at surgery for gyn carcinoma. Marland Kitchen  4.Diabetes: on coverage.  5.PAC in  6.acute renal failure, multifactorial shortly after presentation with cancer in summer 2013. Creatinine stable in good range now. Continues IVF  7.anemia: multifactorial with GI blood loss, chemo, recent surgery. IV feraheme done 09-27-12. Will repeat CBC before acting on reported Hgb 7.9 now. 8. Poor nutritional status: add supplement, which I have written as Glucerna due to diabetes, but certainly dietician can change if needed.    Jerel Sardina P [phone], pager [phone]

## 2012-09-29 LAB — BASIC METABOLIC PANEL
CO2: 25 mEq/L (ref 19–32)
Calcium: 9.2 mg/dL (ref 8.4–10.5)
Creatinine, Ser: 0.56 mg/dL (ref 0.50–1.10)
GFR calc non Af Amer: >90 mL/min (ref >90)

## 2012-09-29 LAB — GLUCOSE, CAPILLARY: Glucose-Capillary: 182 mg/dL — ABNORMAL HIGH (ref 70–99)

## 2012-09-29 LAB — TYPE AND SCREEN: Unit division: 0

## 2012-09-29 MED ORDER — SIMETHICONE 80 MG PO CHEW
80.0000 mg | CHEWABLE_TABLET | Freq: Once | ORAL | Status: AC
  Administered 2012-09-29: 80 mg via ORAL
  Filled 2012-09-29: qty 1

## 2012-09-29 MED ORDER — HEPARIN SOD (PORK) LOCK FLUSH 100 UNIT/ML IV SOLN
500.0000 [IU] | INTRAVENOUS | Status: AC | PRN
  Administered 2012-09-29: 500 [IU]

## 2012-09-29 NOTE — Progress Notes (Signed)
Porta cath deaccessed. Flushed with 10cc NS followed by Heparin 5ml (100u/ml). No bleeding to site, band aid to site for comfort. Roseann Kees M 

## 2012-09-29 NOTE — Discharge Summary (Signed)
Physician Discharge Summary  Patient ID: Ann Baldwin MRN: [id number] DOB/AGE: 12-03-1945 67 y.o.  Admit date: 09/23/2012 Discharge date: 09/29/2012  Admission Diagnoses: Nausea & vomiting  Discharge Diagnoses:  Principal Problem:   Nausea & vomiting Active Problems:   Hypertension   Colon cancer, ascending   Partial small bowel obstruction   Abdominal pain, acute, generalized   Ovarian Cancer  Discharged Condition: The patient is in good condition and stable for discharge.  Hospital Course:  Ann Baldwin is a 67 year old female, who presented to the Emergency Department on 09/23/12 with nausea, vomiting, and severe abdominal pain.  Upon admission, she reported diffuse abdominal pain for 2 days, persistent vomiting, poor PO intake, and - flatus.  She has a history of ovarian cancer and colon cancer status post 6 cycles of Taxol/carboplatin by Dr. Ila Mcgill neoadjuvantly.  She then had a total abdominal hysterectomy, bilateral salpingo-oophorectomy, omentectomy, resection of the midportion of the ascending colon with ileotransverse enterocolostomy by Dr. Nelly Rout on 07/26/2012.  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 She had pneumonia and a UTI since surgery, which she was hospitalized for.  She then had a power port placed 09/20/2012 by IR and chemotherapy restarted on 09/21/2012. Post treatment, she developed increasing abdominal pain, which she is currently admitted for.   A CT scan obtained revealed "Mild dilatation and wall thickening of distal ileum and cecum, in a somewhat sac-like structure in the right abdomen which raises suspicion for internal hernia. A strangulated hernia or bowel ischemia cannot be excluded."  General surgery followed the patient along with Medical Oncology and [facility].  After bowel rest, limited PO intake, and slow diet advancement, her abdominal pain and diarrhea resolved.  At  discharge, she denied abdominal pain, nausea, or emesis.  She was tolerating a regular diet.    Consults:  [facility], General Surgery, Oncology  Significant Diagnostic Studies: Dg Colon W/water Sol Cm  09/27/2012 *RADIOLOGY REPORT* Clinical Data: 67 year old female status post partial colectomy in January. Initial postoperative course uncomplicated. Now with worsening abdominal pain and vomiting. Surgery reportedly for ovarian tumor and colon tumor. Undergoing chemotherapy. WATER SOLUBLE CONTRAST ENEMA Technique: Initial scout AP supine abdominal image was obtained. Water soluble contrast was introduced into the colon in a retrograde fashion and refluxed from the rectum to the cecum. Spot images of the colon followed by overhead radiographs were obtained. Fluoroscopy time: 3.75 minutes. Comparison: CT abdomen pelvis 09/23/2012. Findings: A single contrast water soluble enema was performed and the patient tolerated this well and without difficulty. No obstruction to the retrograde flow of contrast through the distal colon. The sigmoid is non redundant. The left colon is within normal limits. The splenic flexure is mildly redundant. The transverse colon is very redundant, and extends down into the upper pelvis. One contrast reached the transverse colon located in the right lower quadrant, reflux of contrast into small bowel in the inferior right pelvis became apparent. At the same time, some contrast also simultaneously ascended toward the hepatic flexure. A fairly conventional appearing hepatic flexure was ultimately opacified. Contrast then descended in what appeared to be the right colon. At about the same time, additional small bowel loops were filled in a retrograde fashion leading to another: Light segment in the right lower quadrant, which filled and most likely represents the unfilled segment of bowel on the comparison CT. Additional filling of unremarkable appearing small bowel loops in the  pelvis occurred. On subsequent overhead radiographs there is possible filling of the appendix. Post evacuation demonstrate fairly good evacuation of contrast throughout the colon segments. Small bowel contrast in the right lower quadrant and pelvis persists. IMPRESSION: Bowel anastomosis in the right lower quadrant as detailed above. No obstruction to the retrograde flow of contrast to the distal small bowel, and fairly good evacuation of contrast from the large bowel. Study reviewed in person with Dr. Gerrit Friends at 1640 hours on 09/26/2012. Original Report Authenticated By: Erskine Speed, M.D.    09/23/2012 *RADIOLOGY REPORT* Clinical Data: Worsening abdominal pain and vomiting. Ovarian carcinoma colon carcinoma. Currently undergoing chemotherapy. Postop 2 months ago. CT ABDOMEN AND PELVIS WITH CONTRAST Technique: Multidetector CT imaging of the abdomen and pelvis was performed following the standard protocol during bolus administration of intravenous contrast. Contrast: 100 ml Omnipaque-300 and oral contrast Comparison: 05/12/2012 Findings: The distal ileum and cecum are both mildly dilated, contain stool, and show mild wall thickening with stranding in the adjacent mesenteric fat in the right abdomen. These bowel loops appear to be within a sac like structure, just posterior to the ascending colon and inferior to the hepatic flexure, suspicious for internal hernia, and bowel ischemia cannot be excluded. A mass-like filling defect is seen in the ascending colon just proximal to the hepatic flexure which measures 2.2 cm and a colonic polyp cannot be excluded. There is also mild adjacent adenopathy in the right lower quadrant small bowel mesentery, with largest lymph node measuring 1.2 cm on image 61. This is nonspecific and could be inflammatory or neoplastic in etiology. Small amount of free fluid is noted in the pelvic cul-de-sac. Prior hysterectomy noted. Adnexal regions are unremarkable. A tiny calcified gallstone  is seen in the gallbladder is distended however there is no evidence of gallbladder wall thickening or pericholecystic fluid. No evidence of biliary ductal dilatation. The liver, pancreas, spleen, adrenal glands, and kidneys are normal appearance. No evidence of hydronephrosis. No suspicious bone lesions are seen. Mild bibasilar atelectasis noted. IMPRESSION: 1. Mild dilatation and wall thickening of distal ileum and cecum, in a somewhat sac-like structure in the right abdomen which raises suspicion for internal hernia. A strangulated hernia or bowel ischemia cannot be excluded. 2. 2 cm intraluminal mass versus stool ball in the ascending colon near the hepatic flexure. Primary colon carcinoma cannot be excluded. 3. Mild right lower quadrant mesenteric adenopathy, which could be inflammatory in and plastic in etiology. 4. Cholelithiasis. No radiographic evidence of cholecystitis. 5. Mild bibasilar atelectasis. Original Report Authenticated By: Myles Rosenthal, M.D.   Dg Abd Acute W/chest  09/23/2012 *RADIOLOGY REPORT* Clinical Data: 67 year old female with abdominal pain, nausea and vomiting. ACUTE ABDOMEN SERIES (ABDOMEN 2 VIEW & CHEST 1 VIEW) Comparison: 08/21/2012 chest radiograph Findings: Subsegmental atelectasis within the upper right lung and mid left lung now present. Mild elevation of the left hemidiaphragm is again noted. There is no evidence of airspace disease, pleural effusion or pneumothorax. The right colon is mildly distended with possible bowel wall thickening, nonspecific. Surgical suture material within the right lower abdomen noted. There is no evidence of bowel obstruction or pneumoperitoneum. No acute bony abnormalities are identified. IMPRESSION: Mild gaseous distention of the right colon with possible wall thickening. CT may be helpful for further evaluation. No evidence of bowel obstruction or pneumoperitoneum. Subsegmental upper right and mid left lung atelectasis. Original Report  Authenticated By: Harmon Pier, M.D.   Treatments: IV hydration  Discharge Exam: Blood pressure 146/79, pulse 89, temperature 98 F (  36.7 C), temperature source Oral, resp. rate 18, height 5\' 3"  (1.6 m), weight 159 lb 9.6 oz (72.394 kg), SpO2 96.00%. General appearance: alert, cooperative and no distress Resp: clear to auscultation bilaterally Cardio: regular rate and rhythm, S1, S2 normal, no murmur, click, rub or gallop GI: soft, non-tender; bowel sounds normal; no masses,  no organomegaly Extremities: extremities normal, atraumatic, no cyanosis or edema Incision/Wound: Midline incision well healed with no evidence of herniation  Disposition: 01-Home or Self Care      Discharge Orders   Future Appointments Provider Department Dept Phone   10/05/2012 1:30 PM [facility] [phone]   10/05/2012 2:00 PM Reece Packer, MD [facility] [phone]   10/05/2012 3:00 PM [facility] [phone]   10/06/2012 2:30 PM Laurette Schimke, MD [place] [facility] [phone]   12/27/2012 2:00 PM Laurette Schimke, MD [place] [facility] [phone]   Future Orders Complete By Expires     Call MD for:  difficulty breathing, headache or visual disturbances  As directed     Call MD for:  extreme fatigue  As directed     Call MD for:  hives  As directed     Call MD for:  persistant dizziness or light-headedness  As directed     Call MD for:  persistant nausea and vomiting  As directed     Call MD for:  redness, tenderness, or signs of infection (pain, swelling, redness, odor or green/yellow discharge around incision site)  As directed     Call MD for:  severe uncontrolled pain  As directed     Call MD for:  temperature >100.4  As directed     De-access Port A Cath  As directed     Comments:      Prior to discharge     Diet - low sodium heart healthy  As directed     Driving Restrictions  As directed     Comments:      Do not take narcotics and drive.    Increase activity slowly  As directed     Lifting restrictions  As directed     Comments:      No lifting greater than 10 lbs.        Medication List    TAKE these medications       aspirin 81 MG tablet  Take 81 mg by mouth every morning.     atorvastatin 40 MG tablet  Commonly known as:  LIPITOR  Take 40 mg by mouth at bedtime.     dexamethasone 4 MG tablet  Commonly known as:  DECADRON  Take 5 tabs with food 12 hrs. Before taxol.     docusate sodium 100 MG capsule  Commonly known as:  COLACE  Take 100 mg by mouth 2 (two) times daily.     Ensure Plus Liqd  Take 237 mLs by mouth daily.     ezetimibe 10 MG tablet  Commonly known as:  ZETIA  Take 10 mg by mouth every morning.     HYDROcodone-acetaminophen 5-325 MG per tablet  Commonly known as:  NORCO/VICODIN  Take 1 tablet by mouth every 6 (six) hours as needed for pain (pain).     insulin regular 100 units/mL injection  Commonly known as:  NOVOLIN R,HUMULIN R  Inject into the skin 3 (three) times daily before meals.  Sliding scale only as needed for BS>200     iron polysaccharides 150 MG capsule  Commonly known as:  NIFEREX  Take 150 mg by mouth every evening.     lidocaine-prilocaine cream  Commonly known as:  EMLA  Apply to [facility] Cath 1- 2 hours prior to access     LORazepam 0.5 MG tablet  Commonly known as:  ATIVAN  Take 0.5 mg by mouth every 6 (six) hours as needed (nausea).     ondansetron 8 MG tablet  Commonly known as:  ZOFRAN  Take 1 tablet (8 mg total) by mouth every 8 (eight) hours as needed for nausea. Nausea     polyethylene glycol packet  Commonly known as:  MIRALAX / GLYCOLAX  Take 17 g by mouth 2 (two) times daily as needed. constipation       Follow-up Information   Follow up with Laurette Schimke, MD On 10/06/2012. (at 2:30pm)    Contact  information:   [address] [place] [address] [phone]       Signed: CROSS, MELISSA DEAL 09/29/2012, 10:29 AM

## 2012-10-05 ENCOUNTER — Encounter: Payer: Self-pay | Admitting: Oncology

## 2012-10-05 ENCOUNTER — Telehealth: Payer: Self-pay | Admitting: Oncology

## 2012-10-05 ENCOUNTER — Other Ambulatory Visit: Payer: Self-pay

## 2012-10-05 ENCOUNTER — Other Ambulatory Visit (HOSPITAL_BASED_OUTPATIENT_CLINIC_OR_DEPARTMENT_OTHER): Payer: Medicare Other | Admitting: Lab

## 2012-10-05 ENCOUNTER — Telehealth: Payer: Self-pay | Admitting: *Deleted

## 2012-10-05 ENCOUNTER — Ambulatory Visit (HOSPITAL_BASED_OUTPATIENT_CLINIC_OR_DEPARTMENT_OTHER): Payer: Medicaid Other

## 2012-10-05 ENCOUNTER — Ambulatory Visit (HOSPITAL_BASED_OUTPATIENT_CLINIC_OR_DEPARTMENT_OTHER): Payer: Medicare Other | Admitting: Oncology

## 2012-10-05 VITALS — BP 143/82 | HR 103 | Temp 97.0°F | Resp 18 | Ht 63.0 in | Wt 153.9 lb

## 2012-10-05 DIAGNOSIS — E119 Type 2 diabetes mellitus without complications: Secondary | ICD-10-CM

## 2012-10-05 DIAGNOSIS — Z5111 Encounter for antineoplastic chemotherapy: Secondary | ICD-10-CM

## 2012-10-05 DIAGNOSIS — C569 Malignant neoplasm of unspecified ovary: Secondary | ICD-10-CM

## 2012-10-05 DIAGNOSIS — C182 Malignant neoplasm of ascending colon: Secondary | ICD-10-CM

## 2012-10-05 LAB — CBC WITH DIFFERENTIAL/PLATELET
BASO%: 0 % (ref 0.0–2.0)
HCT: 36.9 % (ref 34.8–46.6)
MCHC: 32 g/dL (ref 31.5–36.0)
MONO#: 0 10*3/uL — ABNORMAL LOW (ref 0.1–0.9)
RBC: 3.78 10*6/uL (ref 3.70–5.45)
WBC: 7.1 10*3/uL (ref 3.9–10.3)
lymph#: 0.5 10*3/uL — ABNORMAL LOW (ref 0.9–3.3)
nRBC: 0 % (ref 0–0)

## 2012-10-05 LAB — COMPREHENSIVE METABOLIC PANEL (CC13)
ALT: 13 U/L (ref 0–55)
Alkaline Phosphatase: 85 U/L (ref 40–150)
Sodium: 136 mEq/L (ref 136–145)
Total Bilirubin: <0.2 mg/dL (ref 0.20–1.20)
Total Protein: 6.9 g/dL (ref 6.4–8.3)

## 2012-10-05 LAB — WHOLE BLOOD GLUCOSE
Glucose: 396 mg/dL — ABNORMAL HIGH (ref 70–100)
Glucose: 370 mg/dL — ABNORMAL HIGH (ref 70–100)
HRS PC: 0.5 Hours

## 2012-10-05 MED ORDER — SODIUM CHLORIDE 0.9 % IV SOLN
Freq: Once | INTRAVENOUS | Status: AC
  Administered 2012-10-05: 15:00:00 via INTRAVENOUS

## 2012-10-05 MED ORDER — FAMOTIDINE IN NACL 20-0.9 MG/50ML-% IV SOLN
20.0000 mg | Freq: Once | INTRAVENOUS | Status: AC
  Administered 2012-10-05: 20 mg via INTRAVENOUS

## 2012-10-05 MED ORDER — ONDANSETRON 16 MG/50ML IVPB (CHCC)
16.0000 mg | Freq: Once | INTRAVENOUS | Status: AC
  Administered 2012-10-05: 16 mg via INTRAVENOUS

## 2012-10-05 MED ORDER — SODIUM CHLORIDE 0.9 % IV SOLN
500.0000 mL | Freq: Once | INTRAVENOUS | Status: AC
  Administered 2012-10-05: 500 mL via INTRAVENOUS

## 2012-10-05 MED ORDER — INSULIN REGULAR HUMAN 100 UNIT/ML IJ SOLN
2.0000 [IU] | Freq: Once | INTRAMUSCULAR | Status: AC
  Administered 2012-10-05: 10 [IU] via SUBCUTANEOUS
  Filled 2012-10-05: qty 0.02

## 2012-10-05 MED ORDER — DIPHENHYDRAMINE HCL 50 MG/ML IJ SOLN
50.0000 mg | Freq: Once | INTRAMUSCULAR | Status: AC
  Administered 2012-10-05: 50 mg via INTRAVENOUS

## 2012-10-05 MED ORDER — DEXAMETHASONE 4 MG PO TABS: ORAL_TABLET | ORAL | Status: DC

## 2012-10-05 MED ORDER — SODIUM CHLORIDE 0.9 % IJ SOLN
10.0000 mL | INTRAMUSCULAR | Status: DC | PRN
  Administered 2012-10-05: 10 mL
  Filled 2012-10-05: qty 10

## 2012-10-05 MED ORDER — INSULIN REGULAR HUMAN 100 UNIT/ML IJ SOLN
10.0000 [IU] | Freq: Once | INTRAMUSCULAR | Status: AC
  Administered 2012-10-05: 10 [IU] via SUBCUTANEOUS
  Filled 2012-10-05: qty 0.1

## 2012-10-05 MED ORDER — HEPARIN SOD (PORK) LOCK FLUSH 100 UNIT/ML IV SOLN
500.0000 [IU] | Freq: Once | INTRAVENOUS | Status: AC | PRN
  Administered 2012-10-05: 500 [IU]
  Filled 2012-10-05: qty 5

## 2012-10-05 MED ORDER — DEXAMETHASONE SODIUM PHOSPHATE 10 MG/ML IJ SOLN
10.0000 mg | Freq: Once | INTRAMUSCULAR | Status: AC
  Administered 2012-10-05: 10 mg via INTRAVENOUS

## 2012-10-05 MED ORDER — SODIUM CHLORIDE 0.9 % IV SOLN
80.0000 mg/m2 | Freq: Once | INTRAVENOUS | Status: AC
  Administered 2012-10-05: 144 mg via INTRAVENOUS
  Filled 2012-10-05: qty 24

## 2012-10-05 NOTE — Telephone Encounter (Signed)
gve the pt her April 2014 appt calendar. Pt is aware that her chemo appts will be added. Sent michelle a staff message.

## 2012-10-05 NOTE — Progress Notes (Signed)
OFFICE PROGRESS NOTE   10/05/2012   Physicians:W.Brewster, C.Leone Payor, PCP @ [facility]; D.Newman/ E.Wilson   INTERVAL HISTORY:   Patient is seen, together with granddaughter Turkey and another family member, in follow up of hospitalization 3-14 thru 09-29-12 for abdominal pain and diarrhea, and as we continue adjuvant weekly taxol for IIIB high grade serous ovarian cancer. She is post  neoadjuvant chemotherapy, surgery by gyn oncology which included sigmoid colectomy on 07-26-2012, and diagnosis of synchronous node positive colon cancer at that surgery. She had day 1 cycle 1 weekly taxol on 09-21-12, day 8 held 3-19 due to hospitalization, and she is due day 15 cycle 1 today. We had planned 2-3 cycles of this taxol and will decide after that completes about adjuvant xeloda for the colon cancer. She was transfused one unit PRBCs on 09-28-12 for Hgb 7.9 - 8.3 Patient has had no further abdominal pain since discharge from hospital. She had diarrhea again 3-21 and 3-22, but formed stools since then; C diff was negative in hospital.  Patient presented with GI symptoms thought IBD in April 2013. She had colonoscopy by Dr Stan Head 01-26-12, with large ulcer in ascending colon, pathology [phone] found adenocarcinoma consistent with noncolonic primary. CT AP (with contrast) in [facility] 02-02-12 had complex solid and cystic left adnexal mass, right ovarian mass, omental caking and peritoneal carcinomatosis. She was seen by Dr Nelly Rout on 02-09-12, with CA 125 of 22,938 and CEA 0.5; CMET resulted after Dr Forrestine Him visit had BUN 63 and creatinine 8.78. She was admitted to [facility] 7-31 thru 02-21-12 with acute renal failure felt multifactorial including prerenal with N/V/diarrhea/ascites, NSAID and ACE inhibitors, lasix, IV contrast, HTN and DM. Renal function progressively improved, allowing addition of careful carboplatin to taxol beginning cycle 3 chemotherapy. She had paracentesis of 5 liters on  02-11-12 and 2.4 liters on 02-18-12. CA 125, which was 22,938 at presentation, was down to 1835 prior to cycle 4 (lab done 04-04-12) and is 409 by lab resulting after visit today. She had first 2 cycles of weekly taxol as single agent following the renal problems on presentation, first cycle started on 02-24-12, tolerated adequately tho blood sugars elevate significantly with her diabetes and the steroids. She had carboplatin added day 1 beginning cycle 3 (04-07-12). Last chemotherapy was 06-23-12 she did not require gCSF with the neoadjuvant treatment. Surgery by Dr Nelly Rout was 07-26-12, complete debulking of left ovarian primary and also right hemicolectomy with reanastomosis and 2 lymph node evaluation for the synchronous primary colon cancer.  Patient has been feeling better at home, up during day, eating regular diet and drinking fluids, no fever or symptoms of infection, no bleeding, blood sugars ok. She did take premed decadron for taxol today. Oral ulcers resolved, treated with acyclovir in hospital. No LE swelling. Remainder of 10 point Review of Systems negative.  Objective:  Vital signs in last 24 hours:  BP 143/82  Pulse 103  Temp(Src) 97 F (36.1 C) (Oral)  Resp 18  Ht 5\' 3"  (1.6 m)  Wt 153 lb 14.4 oz (69.809 kg)  BMI 27.27 kg/m2 Weight was 154 on March 7. Alert, NAD in WC, respirations not labored, somewhat pale   HEENT:PERRLA, sclera clear, anicteric and oropharynx clear, no lesions LymphaticsCervical, supraclavicular, and axillary nodes normal. Resp: clear to auscultation bilaterally Cardio: regular rate and rhythm GI: soft, not distended, some normal bowel sounds, surgical incision not remarkable no HSM Extremities: extremities normal, atraumatic, no cyanosis or edema Neuro:nonfocal on exam just in  WC Skin without rash or ecchymosis Portacath -without erythema or tenderness  Lab Results:  Results for orders placed in visit on 10/05/12  CBC WITH DIFFERENTIAL      Result  Value Range   WBC 7.1  3.9 - 10.3 10e3/uL   NEUT# 6.6 (*) 1.5 - 6.5 10e3/uL   HGB 11.8  11.6 - 15.9 g/dL   HCT 16.1  09.6 - 04.5 %   Platelets 513 (*) 145 - 400 10e3/uL   MCV 97.6  79.5 - 101.0 fL   MCH 31.2  25.1 - 34.0 pg   MCHC 32.0  31.5 - 36.0 g/dL   RBC 4.09  8.11 - 9.14 10e6/uL   RDW 16.2 (*) 11.2 - 14.5 %   lymph# 0.5 (*) 0.9 - 3.3 10e3/uL   MONO# 0.0 (*) 0.1 - 0.9 10e3/uL   Eosinophils Absolute 0.0  0.0 - 0.5 10e3/uL   Basophils Absolute 0.0  0.0 - 0.1 10e3/uL   NEUT% 92.1 (*) 38.4 - 76.8 %   LYMPH% 7.3 (*) 14.0 - 49.7 %   MONO% 0.6  0.0 - 14.0 %   EOS% 0.0  0.0 - 7.0 %   BASO% 0.0  0.0 - 2.0 %   nRBC 0  0 - 0 %  WHOLE BLOOD GLUCOSE      Result Value Range   Glucose 396 (*) 70 - 100 mg/dL   HRS PC 0.5    COMPREHENSIVE METABOLIC PANEL (CC13)      Result Value Range   Sodium 136  136 - 145 mEq/L   Potassium 4.6  3.5 - 5.1 mEq/L   Chloride 103  98 - 107 mEq/L   CO2 22  22 - 29 mEq/L   Glucose 382 (*) 70 - 99 mg/dl   BUN 78.2  7.0 - 95.6 mg/dL   Creatinine 1.0  0.6 - 1.1 mg/dL   Total Bilirubin <2.13 Repeated and Verified  0.20 - 1.20 mg/dL   Alkaline Phosphatase 85  40 - 150 U/L   AST 8  5 - 34 U/L   ALT 13  0 - 55 U/L   Total Protein 6.9  6.4 - 8.3 g/dL   Albumin 2.8 (*) 3.5 - 5.0 g/dL   Calcium 9.7  8.4 - 08.6 mg/dL     Dietician met with her in hospital   Studies/Results: CT ABDOMEN AND PELVIS WITH CONTRAST 09-23-12  Comparison: 05/12/2012  Findings: The distal ileum and cecum are both mildly dilated,  contain stool, and show mild wall thickening with stranding in the  adjacent mesenteric fat in the right abdomen. These bowel loops  appear to be within a sac like structure, just posterior to the  ascending colon and inferior to the hepatic flexure, suspicious for  internal hernia, and bowel ischemia cannot be excluded.  A mass-like filling defect is seen in the ascending colon just  proximal to the hepatic flexure which measures 2.2 cm and a colonic   polyp cannot be excluded. There is also mild adjacent adenopathy  in the right lower quadrant small bowel mesentery, with largest  lymph node measuring 1.2 cm on image 61. This is nonspecific and  could be inflammatory or neoplastic in etiology.  Small amount of free fluid is noted in the pelvic cul-de-sac.  Prior hysterectomy noted. Adnexal regions are unremarkable.  A tiny calcified gallstone is seen in the gallbladder is distended  however there is no evidence of gallbladder wall thickening or  pericholecystic fluid. No evidence of biliary  ductal dilatation.  The liver, pancreas, spleen, adrenal glands, and kidneys are normal  appearance. No evidence of hydronephrosis. No suspicious bone  lesions are seen. Mild bibasilar atelectasis noted.  IMPRESSION:  1. Mild dilatation and wall thickening of distal ileum and cecum,  in a somewhat sac-like structure in the right abdomen which raises  suspicion for internal hernia. A strangulated hernia or bowel  ischemia cannot be excluded.  2. 2 cm intraluminal mass versus stool ball in the ascending colon  near the hepatic flexure. Primary colon carcinoma cannot be  excluded.  3. Mild right lower quadrant mesenteric adenopathy, which could be  inflammatory in and plastic in etiology.  4. Cholelithiasis. No radiographic evidence of cholecystitis.  5. Mild bibasilar atelectasis.   Medications: I have reviewed the patient's current medications. We will call in premed decadron 20 mg 12 hrs prior to taxol, enough for cycle 4 (3 weekly treatments)  Assessment/Plan:   1.high grade serous carcinoma involving bilateral ovaries and tubes T3bNx at complete debulking surgery after neoadjuvant taxol/ carboplatin: Trying ~ two additional cycles of weekly single agent taxol (weekly x 3 q 4 weeks), day 15 cycle 1 today. I will see her back with cycle 2 day 1 on 10-19-12, or sooner if needed.  2.T4aN1a adenocarcinoma of ascending colon: other information as  above, CEA not elevated. Consider adjuvant xeloda beginning in ~ 8 weeks if possible after other chemotherapy as in #1  3.diabetes: blood sugars elevated today with decadron, covering with SS insulin here 4.ARF shortly after presentation Aug 2013, multifactorial and resolved back to baseline  5.HTN  6.PAC in 7.hospitalization 3-14 thru 09-29-12 with abdominal pain which resolved with conservative interventions, some diarrhea developing in hospital with negative C diff. CT in hospital with question of internal hernia, no apparent symptoms now. Gyn oncology aware of situation during hospitalization and general surgery followed.   Patient and family are in agreement with plan above.  Gwendoline Judy P, MD   10/05/2012, 5:13 PM

## 2012-10-05 NOTE — Telephone Encounter (Signed)
Per staff phone call and POF I have schedueld appts.  JMW  

## 2012-10-05 NOTE — Patient Instructions (Addendum)
[facility] Discharge Instructions for Patients Receiving Chemotherapy  Today you received the following chemotherapy agents Taxol.  To help prevent nausea and vomiting after your treatment, we encourage you to take your nausea medication.   If you develop nausea and vomiting that is not controlled by your nausea medication, call the clinic. If it is after clinic hours your family physician or the after hours number for the clinic or go to the Emergency Department.   BELOW ARE SYMPTOMS THAT SHOULD BE REPORTED IMMEDIATELY:  *FEVER GREATER THAN 100.5 F  *CHILLS WITH OR WITHOUT FEVER  NAUSEA AND VOMITING THAT IS NOT CONTROLLED WITH YOUR NAUSEA MEDICATION  *UNUSUAL SHORTNESS OF BREATH  *UNUSUAL BRUISING OR BLEEDING  TENDERNESS IN MOUTH AND THROAT WITH OR WITHOUT PRESENCE OF ULCERS  *URINARY PROBLEMS  *BOWEL PROBLEMS  UNUSUAL RASH Items with * indicate a potential emergency and should be followed up as soon as possible.  One of the nurses will contact you 24 hours after your treatment. Please let the nurse know about any problems that you may have experienced. Feel free to call the clinic you have any questions or concerns. The clinic phone number is [phone].   I have been informed and understand all the instructions given to me. I know to contact the clinic, my physician, or go to the Emergency Department if any problems should occur. I do not have any questions at this time, but understand that I may call the clinic during office hours   should I have any questions or need assistance in obtaining follow up care.    __________________________________________  _____________  __________ Signature of Patient or Authorized Representative            Date                   Time    __________________________________________ Nurse's Signature    

## 2012-10-06 ENCOUNTER — Ambulatory Visit: Payer: Medicare Other | Attending: Gynecologic Oncology | Admitting: Gynecologic Oncology

## 2012-10-06 ENCOUNTER — Encounter: Payer: Self-pay | Admitting: Gynecologic Oncology

## 2012-10-06 VITALS — BP 136/74 | HR 78 | Temp 97.8°F | Resp 16 | Ht 66.0 in | Wt 153.7 lb

## 2012-10-06 DIAGNOSIS — R110 Nausea: Secondary | ICD-10-CM | POA: Insufficient documentation

## 2012-10-06 DIAGNOSIS — C569 Malignant neoplasm of unspecified ovary: Secondary | ICD-10-CM

## 2012-10-06 DIAGNOSIS — Z90710 Acquired absence of both cervix and uterus: Secondary | ICD-10-CM | POA: Insufficient documentation

## 2012-10-06 DIAGNOSIS — R197 Diarrhea, unspecified: Secondary | ICD-10-CM | POA: Insufficient documentation

## 2012-10-06 DIAGNOSIS — K633 Ulcer of intestine: Secondary | ICD-10-CM | POA: Insufficient documentation

## 2012-10-06 NOTE — Patient Instructions (Signed)
Intestinal Obstruction An intestinal obstruction comes from a physical blockage in the bowel. Different problems in the bowel may cause these blockages.  CAUSES   Adhesions from previous surgeries  Cancer or tumor  A hernia, which is a condition in which a portion of the bowel bulges out through an opening or weakness in the abdomen (belly). This sometimes squeezes the bowel.  A swallowed object.  Blockage (impaction) with worms is common in third world countries.  A twisting of the bowel or telescoping of a portion of the bowel into another portion (intussusceptions)  Anything that stops food from going through from the stomach to the anus. SYMPTOMS  Symptoms of bowel obstruction may result in your belly getting bigger (bloating), nausea, vomiting, explosive diarrhea, explosive stool. You may not be able to hear your normal bowel sounds (such as "growling in your stomach"). You may also stop having bowel movements or passing gas. DIAGNOSIS  Usually this condition is diagnosed with a history and an examination. Often, lab studies (blood work) and x-rays may be used to find the cause. TREATMENT  The main treatment of this condition is to rest the intestine (gut). Often times the obstruction may relieve itself and allow the gut to start working again. Think of the gut like a balloon that is blown up (filled with trapped food and water) which has squeezed into a hole or area which it cannot get through.   If the obstruction is complete, an NG tube (nasogastric) is passed through the nose and into the stomach. It is then connected to suction to keep the stomach emptied out. This also helps treat the nausea and vomiting.  If there is an imbalance in the electrolytes, they are corrected with intravenous fluids. These have the proper chemicals in them to correct the problem.  If the reason for the obstruction (blockage) does not get better with conservative (non-surgical) treatment, surgery may  be necessary. Sometimes, surgery is done immediately if your surgeon knows that the problem is not going to get better with conservative treatment. PROGNOSIS  Depending on what the problem is, most of these problems can be treated by your caregivers with good results. Your surgeon will discuss the best course of action to take, with you or your loved ones. FOLLOWING SURGERY Seek immediate medical attention if you have:  Increasing abdominal pain, repeated vomiting, dehydration or fainting.  Severe weakness, chest pain or back pain.  Blood in your vomit, stool or you have tarry stool. Document Released: 09/19/2003 Document Revised: 09/21/2011 Document Reviewed: 02/17/2008 [facility] Patient Information 2013 [place], [place].

## 2012-10-06 NOTE — Progress Notes (Signed)
Office Visit Note: Gyn-Onc    Ann Baldwin 67 y.o. female  CC:  Chief Complaint  Patient presents with  . Ovarian Cancer    Follow up  Post op check  HPI: 67 y.o.  y/o G2P2 LNMP 40's  presented in April 2013 with abdominal discomfort, diarrhea, nausea, no constipation  that was thought to be IBD. The symptoms persisted and she was referred for colonoscopy 01/26/2012.  Findings were notable for  A large ulcer with heaped up margins in the ascending colon.  It was difficult to see and to obtain biopsies.    Pathology was c/w adenocarcinoma and the concern that it was not a primary colonic adenocarcinoma.    CT abdomen and pelvis 02/01/2012 with a 5x6.2 cmcomplex solid and cystic left adnexal mass, 3.9x2.9 right ovarian soft tissue mass, omental caking and peritoneal carcinomatosis.    The initial CA 125 returned > 22,000 CEA wnl, immuno stains of the biopsy were not c/w colon cancer.  Cr was > 8 and the patient was admitted with acute renal failure. Cytology from paracentesis was c/w Gyn primary. She has received 6 cycles of dose dense Taxol/carboplatin. Cycle 6was administered on 06/23/2012. CA 125 34.7 06/2012  A CT scan of the abdomen and pelvis collected on 05/12/2012 IMPRESSION:  1. Interval response to therapy. There has been near complete resolution of abdominal ascites and peritoneal nodularity. 2. Interval decrease in the size of bilateral adnexal cystic lesions. 3. Small soft tissue attenuating nodule arising from the uterine fundus is unchanged in size from previous exam and is favored to represent a subserosal (exophytic) fibroid. 4. No new or progressive disease identified. 5. Small subpleural nodule in the right middle lobe is stable from previous exam.   In July 26 2012 she underwent total abdominal hysterectomy bilateral salpingo-oophorectomy omentectomy resection of the midportion of the ascending colon with ileotransverse enterocolostomy. Final pathology was notable for high  grade serous carcinoma involving bilateral ovaries fallopian tubes and omentum. 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  Postoperatively she was diagnosed with pneumonia and a UTI.   Earlier in March she was admitted with abdominal pain. The differential diagnosis as well as ischemia versus obstruction. This process resolved with conservative management. Patient states that she feels well as had 2 bowel movements today, has a great appetite. Chemotherapy was administered last week and she feels well.  Past Surgical Hx:  Past Surgical History  Procedure Laterality Date  . No past surgeries    . Laparotomy  07/26/2012    Procedure: EXPLORATORY LAPAROTOMY;  Surgeon: Laurette Schimke, MD PHD;  Location: [facility];  Service: Gynecology;  Laterality: N/A;  Exploratory Laporatomy/Total Abdominal Hysterectomy Bilatderal Salpingo Oopherectomy/Tumor Debulking  . Abdominal hysterectomy  07/26/2012    Procedure: HYSTERECTOMY ABDOMINAL;  Surgeon: Laurette Schimke, MD PHD;  Location: [facility];  Service: Gynecology;;  . Salpingoophorectomy  07/26/2012    Procedure: SALPINGO OOPHORECTOMY;  Surgeon: Laurette Schimke, MD PHD;  Location: [facility];  Service: Gynecology;  Laterality: Bilateral;  . Omentectomy  07/26/2012    Procedure: OMENTECTOMY;  Surgeon: Laurette Schimke, MD PHD;  Location: [facility];  Service: Gynecology;;  . Colostomy revision  07/26/2012    Procedure: COLON RESECTION RIGHT;  Surgeon: Laurette Schimke, MD PHD;  Location: [facility];  Service: Gynecology;;  with reanastomsis    Past Medical Hx:  Past Medical History  Diagnosis Date  .  Diabetes mellitus   . Hyperlipidemia   . Ovarian cancer   . Hypertension     no current meds for since aug 2013  . Anemia   . Colon cancer   Last mammogram 2 years ago wnl.  Past Gynecological History: G2 P2 LNMP in 40's.  Menarche 12 regular menses until  menopause.  No contraceptives used.  No h/o abnormal pap test, Last pap 10 years ago.  NSVD x 2.  No LMP recorded. Patient has had a hysterectomy.  Family Hx:  Family History  Problem Relation Age of Onset  . Colon cancer Neg Hx   . Esophageal cancer Neg Hx   . Stomach cancer Neg Hx   . Rectal cancer Neg Hx     Review of Systems:  Constitutional  Feels very tired but much improved last visit.  Appetite has improved Cardiovascular  No chest pain, shortness of breath,  Pulmonary  No cough or wheeze.  Gastro Intestinal  Denies nausea,  No bright red blood per rectum.  No constipation, no diarrhea,   Genito Urinary  No vaginal bleeding or discharge ID  no fever or chills  Vitals:  Blood pressure 136/74, pulse 78, temperature 97.8 F (36.6 C), temperature source Oral, resp. rate 16, height 5\' 6"  (1.676 m), weight 153 lb 11.2 oz (69.718 kg).  Physical Exam: Thin WD in NAD. Appears exhausted Chest: Clear to auscultation Heart: Regular rate and rhythm normal S1-S2 Abdomen: Soft nontender no rebound or guarding.  Incision clean dry and intact.  Normal BS. Back: no CVA tenderness Extremities: 1-2+ lower extremity edema bilaterally   Assessment/Plan:  [name]  is a 67 y.o.  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  She underwent interval debulking 07/26/2012  and findings were consistent with a synchronous ovarian and ascending colon malignancy. Postoperative course was complicated by either bowel obstruction or ischemia that spontaneously resolved. She schedule to resume her dose dense treatment. If there further episodes of abdominal pain consideration could be made for discontinuation of Taxol since it has been associated with colitis.  Patient was counseled regarding signs and symptoms of bowel obstruction and instructed to pursue a clear  liquid diet for any evidence of abdominal pain.   Followup with GYN oncology in 3 months.  Laurette Schimke, MD, PhD 10/06/2012, 4:17 PM

## 2012-10-12 ENCOUNTER — Other Ambulatory Visit: Payer: Self-pay | Admitting: *Deleted

## 2012-10-12 DIAGNOSIS — C569 Malignant neoplasm of unspecified ovary: Secondary | ICD-10-CM

## 2012-10-12 MED ORDER — HYDROCODONE-ACETAMINOPHEN 5-325 MG PO TABS: 1.0000 | ORAL_TABLET | Freq: Four times a day (QID) | ORAL | Status: DC | PRN

## 2012-10-16 ENCOUNTER — Other Ambulatory Visit: Payer: Self-pay | Admitting: Oncology

## 2012-10-16 DIAGNOSIS — E119 Type 2 diabetes mellitus without complications: Secondary | ICD-10-CM

## 2012-10-16 DIAGNOSIS — C569 Malignant neoplasm of unspecified ovary: Secondary | ICD-10-CM

## 2012-10-19 ENCOUNTER — Ambulatory Visit (HOSPITAL_BASED_OUTPATIENT_CLINIC_OR_DEPARTMENT_OTHER): Payer: Medicare Other | Admitting: Lab

## 2012-10-19 ENCOUNTER — Encounter: Payer: Self-pay | Admitting: Oncology

## 2012-10-19 ENCOUNTER — Ambulatory Visit (HOSPITAL_BASED_OUTPATIENT_CLINIC_OR_DEPARTMENT_OTHER): Payer: Medicare Other | Admitting: Oncology

## 2012-10-19 ENCOUNTER — Ambulatory Visit (HOSPITAL_BASED_OUTPATIENT_CLINIC_OR_DEPARTMENT_OTHER): Payer: Medicare Other

## 2012-10-19 VITALS — BP 138/87 | HR 105 | Temp 96.9°F | Resp 18 | Ht 66.0 in | Wt 148.6 lb

## 2012-10-19 DIAGNOSIS — C7982 Secondary malignant neoplasm of genital organs: Secondary | ICD-10-CM

## 2012-10-19 DIAGNOSIS — E119 Type 2 diabetes mellitus without complications: Secondary | ICD-10-CM

## 2012-10-19 DIAGNOSIS — R739 Hyperglycemia, unspecified: Secondary | ICD-10-CM

## 2012-10-19 DIAGNOSIS — C182 Malignant neoplasm of ascending colon: Secondary | ICD-10-CM

## 2012-10-19 DIAGNOSIS — C569 Malignant neoplasm of unspecified ovary: Secondary | ICD-10-CM

## 2012-10-19 DIAGNOSIS — Z5111 Encounter for antineoplastic chemotherapy: Secondary | ICD-10-CM

## 2012-10-19 LAB — WHOLE BLOOD GLUCOSE: HRS PC: 1 Hours

## 2012-10-19 LAB — COMPREHENSIVE METABOLIC PANEL (CC13)
ALT: 11 U/L (ref 0–55)
AST: 5 U/L (ref 5–34)
Albumin: 2.7 g/dL — ABNORMAL LOW (ref 3.5–5.0)
Alkaline Phosphatase: 110 U/L (ref 40–150)
Glucose: 608 mg/dl (ref 70–99)
Potassium: 4.2 mEq/L (ref 3.5–5.1)
Sodium: 131 mEq/L — ABNORMAL LOW (ref 136–145)
Total Bilirubin: 0.27 mg/dL (ref 0.20–1.20)
Total Protein: 6.8 g/dL (ref 6.4–8.3)

## 2012-10-19 LAB — CBC WITH DIFFERENTIAL/PLATELET
Eosinophils Absolute: 0 10*3/uL (ref 0.0–0.5)
MONO#: 0.1 10*3/uL (ref 0.1–0.9)
NEUT#: 11.1 10*3/uL — ABNORMAL HIGH (ref 1.5–6.5)
RBC: 4.01 10*6/uL (ref 3.70–5.45)
RDW: 14.9 % — ABNORMAL HIGH (ref 11.2–14.5)
WBC: 11.9 10*3/uL — ABNORMAL HIGH (ref 3.9–10.3)
lymph#: 0.8 10*3/uL — ABNORMAL LOW (ref 0.9–3.3)
nRBC: 0 % (ref 0–0)

## 2012-10-19 MED ORDER — DEXAMETHASONE SODIUM PHOSPHATE 10 MG/ML IJ SOLN
10.0000 mg | Freq: Once | INTRAMUSCULAR | Status: AC
  Administered 2012-10-19: 10 mg via INTRAVENOUS

## 2012-10-19 MED ORDER — SODIUM CHLORIDE 0.9 % IV SOLN
Freq: Once | INTRAVENOUS | Status: AC
  Administered 2012-10-19: 12:00:00 via INTRAVENOUS

## 2012-10-19 MED ORDER — INSULIN REGULAR HUMAN 100 UNIT/ML IJ SOLN
10.0000 [IU] | Freq: Once | INTRAMUSCULAR | Status: AC
  Administered 2012-10-19: 10 [IU] via SUBCUTANEOUS
  Filled 2012-10-19: qty 0.1

## 2012-10-19 MED ORDER — ONDANSETRON 16 MG/50ML IVPB (CHCC)
16.0000 mg | Freq: Once | INTRAVENOUS | Status: AC
  Administered 2012-10-19: 16 mg via INTRAVENOUS

## 2012-10-19 MED ORDER — SODIUM CHLORIDE 0.9 % IJ SOLN
10.0000 mL | INTRAMUSCULAR | Status: DC | PRN
  Filled 2012-10-19: qty 10

## 2012-10-19 MED ORDER — SODIUM CHLORIDE 0.9 % IV SOLN
80.0000 mg/m2 | Freq: Once | INTRAVENOUS | Status: AC
  Administered 2012-10-19: 144 mg via INTRAVENOUS
  Filled 2012-10-19: qty 24

## 2012-10-19 MED ORDER — SODIUM CHLORIDE 0.9 % IV SOLN
INTRAVENOUS | Status: AC
  Administered 2012-10-19: 15:00:00 via INTRAVENOUS

## 2012-10-19 MED ORDER — DIPHENHYDRAMINE HCL 50 MG/ML IJ SOLN
50.0000 mg | Freq: Once | INTRAMUSCULAR | Status: AC
  Administered 2012-10-19: 50 mg via INTRAVENOUS

## 2012-10-19 MED ORDER — HEPARIN SOD (PORK) LOCK FLUSH 100 UNIT/ML IV SOLN
500.0000 [IU] | Freq: Once | INTRAVENOUS | Status: AC | PRN
  Filled 2012-10-19: qty 5

## 2012-10-19 MED ORDER — FAMOTIDINE IN NACL 20-0.9 MG/50ML-% IV SOLN
20.0000 mg | Freq: Once | INTRAVENOUS | Status: AC
  Administered 2012-10-19: 20 mg via INTRAVENOUS

## 2012-10-19 MED ORDER — INSULIN REGULAR HUMAN 100 UNIT/ML IJ SOLN
2.0000 [IU] | Freq: Once | INTRAMUSCULAR | Status: AC
  Administered 2012-10-19: 10 [IU] via SUBCUTANEOUS
  Filled 2012-10-19: qty 0.02

## 2012-10-19 NOTE — Patient Instructions (Addendum)
[facility] Discharge Instructions for Patients Receiving Chemotherapy  Today you received the following chemotherapy agents : Taxol To help prevent nausea and vomiting after your treatment, we encourage you to take your nausea medication as directed.   If you develop nausea and vomiting that is not controlled by your nausea medication, call the clinic. If it is after clinic hours your family physician or the after hours number for the clinic or go to the Emergency Department.   BELOW ARE SYMPTOMS THAT SHOULD BE REPORTED IMMEDIATELY:  *FEVER GREATER THAN 100.5 F  *CHILLS WITH OR WITHOUT FEVER  NAUSEA AND VOMITING THAT IS NOT CONTROLLED WITH YOUR NAUSEA MEDICATION  *UNUSUAL SHORTNESS OF BREATH  *UNUSUAL BRUISING OR BLEEDING  TENDERNESS IN MOUTH AND THROAT WITH OR WITHOUT PRESENCE OF ULCERS  *URINARY PROBLEMS  *BOWEL PROBLEMS  UNUSUAL RASH Items with * indicate a potential emergency and should be followed up as soon as possible.   Feel free to call the clinic you have any questions or concerns. The clinic phone number is [phone].   I have been informed and understand all the instructions given to me. I know to contact the clinic, my physician, or go to the Emergency Department if any problems should occur. I do not have any questions at this time, but understand that I may call the clinic during office hours   should I have any questions or need assistance in obtaining follow up care.    __________________________________________  _____________  __________ Signature of Patient or Authorized Representative            Date                   Time    __________________________________________ Nurse's Signature

## 2012-10-19 NOTE — Progress Notes (Signed)
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 She is continuing weekly taxol, then we may consider oral xeloda additionally. She will have day 1 of last planned cycle of taxol today.   Oncologic History Patient presented with GI symptoms thought IBD in April 2013. She had colonoscopy by Dr Stan Head 01-26-12, with large ulcer in ascending colon, pathology [id number] found adenocarcinoma consistent with noncolonic primary. CT AP (with contrast) in [facility] 02-02-12 had complex solid and cystic left adnexal mass, right ovarian mass, omental caking and peritoneal carcinomatosis. She was seen by Dr Nelly Rout on 02-09-12, with CA 125 of 22,938 and CEA 0.5; CMET resulted after Dr Forrestine Him visit had BUN 63 and creatinine 8.78. She was admitted to [facility] 7-31 thru 02-21-12 with acute renal failure felt multifactorial including prerenal with N/V/diarrhea/ascites, NSAID and ACE inhibitors, lasix, IV contrast, HTN and DM. Renal function progressively improved, allowing addition of careful carboplatin to taxol beginning cycle 3 chemotherapy. She had paracentesis of 5 liters on 02-11-12 and 2.4 liters on 02-18-12. CA 125, which was 22,938 at presentation, was down to 1835 prior to cycle 4 (lab done 04-04-12) and is 409 by lab resulting after visit today. She had first 2 cycles of weekly taxol as single agent following the renal problems on presentation, first cycle started on 02-24-12, tolerated adequately tho blood sugars elevate significantly with her diabetes and the steroids. She had carboplatin added day 1 beginning cycle 3 (04-07-12). Last chemotherapy was 06-23-12 she did not require gCSF with the neoadjuvant treatment. Surgery by Dr  Nelly Rout was 07-26-12, complete debulking of left ovarian primary and also right hemicolectomy with reanastomosis and 2 lymph node evaluation for the synchronous primary colon cancer. She had first weekly taxol on 09-21-12, then day 8 held as she was hospitalized 3-14 thru 09-29-12 for abdominal pain and C diff negative diarrhea, which all resolved with supportive care; she had day 15 taxol on 10-05-12.   Patient fatigues easily, having to rest frequently with activity at home. She was constipated for a few days, but bowels have moved regularly since then. She has had no further abdominal pain, no bleeding, no fever, no peripheral neuropathy symptoms, blood sugars this week higher at 160-170 tho she is not following diabetic diet other than diet sodas. She ate pizza, 1/2 peanut butter sandwich and 1 Ensure yesterday; I have LM for [facility] dietician requesting sample supplements if possible. She is able to sleep. She denies nausea.   Remainder of 10 point Review of Systems negative/ unchanged.  Objective:  Vital signs in last 24 hours:  BP 138/87  Pulse 105  Temp(Src) 96.9 F (36.1 C) (Oral)  Resp 18  Ht 5\' 6"  (1.676 m)  Wt 148 lb 9.6 oz (67.405 kg)  BMI 24 kg/m2 Weight is down 4.5 lbs. Alert, not in any apparent discomfort seated in WC, respirations not labored RA.   HEENT:PERRLA, sclera clear, anicteric and oropharynx clear, no lesions LymphaticsCervical, supraclavicular, and axillary nodes normal. Resp: clear to auscultation bilaterally Cardio: regular rate and rhythm GI: soft, not distended, midline incision closed and not remarkable, some BS, no clear organomegaly or mass on exam done just in [facility] Extremities: extremities normal, atraumatic, no cyanosis or edema Neuro:no sensory deficits noted Skin without rash or  ecchymosis Portacath -without erythema or tenderness  Lab Results:  Results for orders placed in visit on 10/19/12  CBC WITH DIFFERENTIAL      Result Value Range   WBC 11.9  (*) 3.9 - 10.3 10e3/uL   NEUT# 11.1 (*) 1.5 - 6.5 10e3/uL   HGB 12.5  11.6 - 15.9 g/dL   HCT 96.0  45.4 - 09.8 %   Platelets 387  145 - 400 10e3/uL   MCV 97.0  79.5 - 101.0 fL   MCH 31.2  25.1 - 34.0 pg   MCHC 32.1  31.5 - 36.0 g/dL   RBC 1.19  1.47 - 8.29 10e6/uL   RDW 14.9 (*) 11.2 - 14.5 %   lymph# 0.8 (*) 0.9 - 3.3 10e3/uL   MONO# 0.1  0.1 - 0.9 10e3/uL   Eosinophils Absolute 0.0  0.0 - 0.5 10e3/uL   Basophils Absolute 0.0  0.0 - 0.1 10e3/uL   NEUT% 92.6 (*) 38.4 - 76.8 %   LYMPH% 6.6 (*) 14.0 - 49.7 %   MONO% 0.7  0.0 - 14.0 %   EOS% 0.0  0.0 - 7.0 %   BASO% 0.1  0.0 - 2.0 %   nRBC 0  0 - 0 %  WHOLE BLOOD GLUCOSE      Result Value Range   Glucose    70 - 100 mg/dL   Value: Above linear limit, confirmation by reference method to follow   HRS PC 1.0     Initial blood sugar 608 such that she was covered with SS insulin and given total 1 liter of additional IV NS; repeat 430, again covered with SS insulin and the IVF as above.  Studies/Results:  CT AP done 09-23-12 during hospitalization for abdominal pain.  Medications: I have reviewed the patient's current medications. Premedication decadron is 20 mg 12 hrs prior; we will decrease this to 16 mg 12 hrs prior for next treatments, which RN will discuss with patient by phone.  Assessment/Plan: 1.high grade serous carcinoma involving bilateral ovaries and tubes T3bNx at complete debulking surgery after neoadjuvant taxol/ carboplatin: Trying two additional cycles of weekly single agent taxol (weekly x 3 q 4 weeks), day 1 cycle 2 today; single taxol chosen due to poor PS now, and as she tolerated this better than carboplatin. I will see her with day 15 treatment, or sooner if needed. She is to see Dr Nelly Rout on 11-08-12. 2.T4aN1a adenocarcinoma of ascending colon: other information as above, CEA not elevated. Consider adjuvant xeloda after this taxol.  3.diabetes: blood sugar addressed as above now. 4.ARF shortly after presentation  Aug 2013, multifactorial and resolved back to baseline  5.HTN  6.PAC in  7.hospitalization 3-14 thru 09-29-12 with abdominal pain which resolved with conservative interventions, CT in hospital with question of internal hernia, no apparent symptoms now. Gyn oncology aware of situation during hospitalization and general surgery followed. 8.general debility related to all of above and po intake not optimal. Follow up with dietician, family very supportive.  Time spent 25 min including >50% discussion and coordination of care  Hector Venne P, MD   10/19/2012, 12:48 PM

## 2012-10-19 NOTE — Progress Notes (Unsigned)
Blood Sugar 430,  Dr. Darrold Span notified and intstructed to follow sliding scale for insulin coverage.  10 units given per sliding scale.  Pt to recheck blood sugar at home before bed and use sliding scale for insulin coverage. Pt to complete one liter of fluid prior to d/c home.  Pt verbalized understanding.

## 2012-10-20 ENCOUNTER — Other Ambulatory Visit: Payer: Self-pay | Admitting: Certified Registered Nurse Anesthetist

## 2012-10-21 ENCOUNTER — Telehealth: Payer: Self-pay

## 2012-10-21 NOTE — Telephone Encounter (Signed)
Message copied by Lorine Bears on Fri Oct 21, 2012  4:40 PM ------      Message from: Jama Flavors P      Created: Thu Oct 20, 2012  6:47 PM       Please let patient know that I would like to decrease the oral decadron premed for taxol on 4-16 and 4-23 to 16 mg (=4 tablets) instead of 20 mg (=5 tablets) as she has been taking, 12 hrs prior to taxol. Are her blood sugars doing better now? ------

## 2012-10-21 NOTE — Telephone Encounter (Signed)
Left message for Ms. Pricilla Holm to call regarding adjusting decadron premed for treatment 10-26-12.

## 2012-10-24 NOTE — Telephone Encounter (Signed)
Spoke with [name] and discussed the decrease in decadron premed to 16 mg 12 hours prior to chemotherapy as noted below by Dr. Darrold Span.   Pt. States that her blood sugar has been lower.  Blood sugar was 220 prior to lunch today so she took her sliding scale insulin dose.

## 2012-10-26 ENCOUNTER — Other Ambulatory Visit: Payer: Self-pay | Admitting: *Deleted

## 2012-10-26 ENCOUNTER — Other Ambulatory Visit: Payer: Self-pay | Admitting: Oncology

## 2012-10-26 ENCOUNTER — Encounter: Payer: Self-pay | Admitting: Nutrition

## 2012-10-26 ENCOUNTER — Other Ambulatory Visit (HOSPITAL_BASED_OUTPATIENT_CLINIC_OR_DEPARTMENT_OTHER): Payer: Medicare Other | Admitting: Lab

## 2012-10-26 ENCOUNTER — Ambulatory Visit (HOSPITAL_BASED_OUTPATIENT_CLINIC_OR_DEPARTMENT_OTHER): Payer: Medicare Other

## 2012-10-26 VITALS — BP 130/65 | HR 105 | Temp 97.0°F | Resp 18

## 2012-10-26 DIAGNOSIS — C569 Malignant neoplasm of unspecified ovary: Secondary | ICD-10-CM

## 2012-10-26 DIAGNOSIS — Z5111 Encounter for antineoplastic chemotherapy: Secondary | ICD-10-CM

## 2012-10-26 DIAGNOSIS — E119 Type 2 diabetes mellitus without complications: Secondary | ICD-10-CM

## 2012-10-26 LAB — WHOLE BLOOD GLUCOSE: HRS PC: 0.5 Hours

## 2012-10-26 LAB — CBC WITH DIFFERENTIAL/PLATELET
Basophils Absolute: 0 10*3/uL (ref 0.0–0.1)
Eosinophils Absolute: 0 10*3/uL (ref 0.0–0.5)
HCT: 36.9 % (ref 34.8–46.6)
HGB: 12 g/dL (ref 11.6–15.9)
LYMPH%: 11.8 % — ABNORMAL LOW (ref 14.0–49.7)
MCV: 95.6 fL (ref 79.5–101.0)
MONO%: 3.3 % (ref 0.0–14.0)
NEUT#: 4.2 10*3/uL (ref 1.5–6.5)
Platelets: 483 10*3/uL — ABNORMAL HIGH (ref 145–400)

## 2012-10-26 MED ORDER — ONDANSETRON 16 MG/50ML IVPB (CHCC)
16.0000 mg | Freq: Once | INTRAVENOUS | Status: AC
  Administered 2012-10-26: 16 mg via INTRAVENOUS

## 2012-10-26 MED ORDER — HEPARIN SOD (PORK) LOCK FLUSH 100 UNIT/ML IV SOLN
500.0000 [IU] | Freq: Once | INTRAVENOUS | Status: AC | PRN
  Administered 2012-10-26: 500 [IU]
  Filled 2012-10-26: qty 5

## 2012-10-26 MED ORDER — DEXTROSE 5 % IV SOLN
80.0000 mg/m2 | Freq: Once | INTRAVENOUS | Status: AC
  Administered 2012-10-26: 144 mg via INTRAVENOUS
  Filled 2012-10-26: qty 24

## 2012-10-26 MED ORDER — HYDROCODONE-ACETAMINOPHEN 5-325 MG PO TABS: 1.0000 | ORAL_TABLET | Freq: Four times a day (QID) | ORAL | Status: DC | PRN

## 2012-10-26 MED ORDER — SODIUM CHLORIDE 0.9 % IJ SOLN
10.0000 mL | INTRAMUSCULAR | Status: DC | PRN
  Administered 2012-10-26: 10 mL
  Filled 2012-10-26: qty 10

## 2012-10-26 MED ORDER — SODIUM CHLORIDE 0.9 % IV SOLN
Freq: Once | INTRAVENOUS | Status: AC
  Administered 2012-10-26: 15:00:00 via INTRAVENOUS

## 2012-10-26 MED ORDER — DIPHENHYDRAMINE HCL 50 MG/ML IJ SOLN
50.0000 mg | Freq: Once | INTRAMUSCULAR | Status: AC
  Administered 2012-10-26: 50 mg via INTRAVENOUS

## 2012-10-26 MED ORDER — FAMOTIDINE IN NACL 20-0.9 MG/50ML-% IV SOLN
20.0000 mg | Freq: Once | INTRAVENOUS | Status: AC
  Administered 2012-10-26: 20 mg via INTRAVENOUS

## 2012-10-26 MED ORDER — DEXAMETHASONE SODIUM PHOSPHATE 10 MG/ML IJ SOLN
10.0000 mg | Freq: Once | INTRAMUSCULAR | Status: AC
  Administered 2012-10-26: 10 mg via INTRAVENOUS

## 2012-10-26 MED ORDER — INSULIN REGULAR HUMAN 100 UNIT/ML IJ SOLN
2.0000 [IU] | Freq: Once | INTRAMUSCULAR | Status: AC
  Administered 2012-10-26 (×2): 10 [IU] via SUBCUTANEOUS
  Filled 2012-10-26: qty 0.02

## 2012-10-26 NOTE — Patient Instructions (Addendum)
[facility] Discharge Instructions for Patients Receiving Chemotherapy  Today you received the following chemotherapy agents :  Taxol.  To help prevent nausea and vomiting after your treatment, we encourage you to take your nausea medication as instructed by your physician.    If you develop nausea and vomiting that is not controlled by your nausea medication, call the clinic. If it is after clinic hours your family physician or the after hours number for the clinic or go to the Emergency Department.   BELOW ARE SYMPTOMS THAT SHOULD BE REPORTED IMMEDIATELY:  *FEVER GREATER THAN 100.5 F  *CHILLS WITH OR WITHOUT FEVER  NAUSEA AND VOMITING THAT IS NOT CONTROLLED WITH YOUR NAUSEA MEDICATION  *UNUSUAL SHORTNESS OF BREATH  *UNUSUAL BRUISING OR BLEEDING  TENDERNESS IN MOUTH AND THROAT WITH OR WITHOUT PRESENCE OF ULCERS  *URINARY PROBLEMS  *BOWEL PROBLEMS  UNUSUAL RASH Items with * indicate a potential emergency and should be followed up as soon as possible.  One of the nurses will contact you 24 hours after your treatment. Please let the nurse know about any problems that you may have experienced. Feel free to call the clinic you have any questions or concerns. The clinic phone number is [phone].   I have been informed and understand all the instructions given to me. I know to contact the clinic, my physician, or go to the Emergency Department if any problems should occur. I do not have any questions at this time, but understand that I may call the clinic during office hours   should I have any questions or need assistance in obtaining follow up care.    __________________________________________  _____________  __________ Signature of Patient or Authorized Representative            Date                   Time    __________________________________________ Nurse's Signature    

## 2012-10-26 NOTE — Telephone Encounter (Signed)
Refill called in to pharmacy

## 2012-10-26 NOTE — Progress Notes (Signed)
Spoke with patient today regarding oral intake. Patient reports that she did not feel well after chemotherapy last week and spent a day to a day and a half resting but not eating. Patient was not concerned with weight loss. She has lost approximately 6 pounds. Weight was documented as 148.6 pounds down from usual weight of 154 pounds. This reflects a 4% weight loss.  Nutrition diagnosis: Inadequate oral intake related to ovarian cancer and associated treatments as evidenced by 4% weight loss over 2 weeks.  Intervention: I educated patient on the importance of smaller, more frequent meals and snacks throughout the day. I've encouraged higher calorie foods and protein foods. I have recommended patient consumes ensure or Glucerna between meals to provide additional calories and protein. I have provided multiple supplements for patient to take with her today along with coupons for her to purchase in the future. Patient agreeable to trying to increase her intake. Teach back method used.  Monitoring, evaluation, goals: Patient will tolerate increased oral intake to minimize any further weight loss.  Next visit: Patient agrees to contact me for further questions or if she requires additional samples.

## 2012-11-02 ENCOUNTER — Encounter: Payer: Self-pay | Admitting: Oncology

## 2012-11-02 ENCOUNTER — Other Ambulatory Visit: Payer: Medicare Other | Admitting: Lab

## 2012-11-02 ENCOUNTER — Telehealth: Payer: Self-pay | Admitting: Oncology

## 2012-11-02 ENCOUNTER — Ambulatory Visit (HOSPITAL_BASED_OUTPATIENT_CLINIC_OR_DEPARTMENT_OTHER): Payer: Medicare Other | Admitting: Oncology

## 2012-11-02 ENCOUNTER — Other Ambulatory Visit (HOSPITAL_BASED_OUTPATIENT_CLINIC_OR_DEPARTMENT_OTHER): Payer: Medicare Other | Admitting: Lab

## 2012-11-02 ENCOUNTER — Other Ambulatory Visit: Payer: Self-pay

## 2012-11-02 ENCOUNTER — Ambulatory Visit (HOSPITAL_BASED_OUTPATIENT_CLINIC_OR_DEPARTMENT_OTHER): Payer: Medicare Other

## 2012-11-02 VITALS — BP 124/76 | HR 102 | Temp 98.0°F | Resp 20 | Ht 66.0 in | Wt 148.3 lb

## 2012-11-02 DIAGNOSIS — C7982 Secondary malignant neoplasm of genital organs: Secondary | ICD-10-CM

## 2012-11-02 DIAGNOSIS — E119 Type 2 diabetes mellitus without complications: Secondary | ICD-10-CM

## 2012-11-02 DIAGNOSIS — C182 Malignant neoplasm of ascending colon: Secondary | ICD-10-CM

## 2012-11-02 DIAGNOSIS — C569 Malignant neoplasm of unspecified ovary: Secondary | ICD-10-CM

## 2012-11-02 DIAGNOSIS — Z5111 Encounter for antineoplastic chemotherapy: Secondary | ICD-10-CM

## 2012-11-02 DIAGNOSIS — C562 Malignant neoplasm of left ovary: Secondary | ICD-10-CM

## 2012-11-02 DIAGNOSIS — D649 Anemia, unspecified: Secondary | ICD-10-CM

## 2012-11-02 LAB — COMPREHENSIVE METABOLIC PANEL
CO2: 22 mEq/L (ref 19–32)
Creatinine, Ser: 0.76 mg/dL (ref 0.50–1.10)
Glucose, Bld: 486 mg/dL — ABNORMAL HIGH (ref 70–99)
Sodium: 132 mEq/L — ABNORMAL LOW (ref 135–145)
Total Bilirubin: 0.1 mg/dL — ABNORMAL LOW (ref 0.3–1.2)
Total Protein: 6.4 g/dL (ref 6.0–8.3)

## 2012-11-02 LAB — CBC WITH DIFFERENTIAL/PLATELET
BASO%: 0.2 % (ref 0.0–2.0)
HCT: 35 % (ref 34.8–46.6)
HGB: 11 g/dL — ABNORMAL LOW (ref 11.6–15.9)
MCHC: 31.4 g/dL — ABNORMAL LOW (ref 31.5–36.0)
MONO#: 0.2 10*3/uL (ref 0.1–0.9)
NEUT%: 86 % — ABNORMAL HIGH (ref 38.4–76.8)
WBC: 5.2 10*3/uL (ref 3.9–10.3)
lymph#: 0.6 10*3/uL — ABNORMAL LOW (ref 0.9–3.3)

## 2012-11-02 LAB — WHOLE BLOOD GLUCOSE
Glucose: 374 mg/dL — ABNORMAL HIGH (ref 70–100)
HRS PC: 0.75 Hours

## 2012-11-02 MED ORDER — DEXAMETHASONE SODIUM PHOSPHATE 10 MG/ML IJ SOLN
10.0000 mg | Freq: Once | INTRAMUSCULAR | Status: AC
  Administered 2012-11-02: 10 mg via INTRAVENOUS

## 2012-11-02 MED ORDER — SODIUM CHLORIDE 0.9 % IJ SOLN
10.0000 mL | INTRAMUSCULAR | Status: DC | PRN
  Administered 2012-11-02: 10 mL
  Filled 2012-11-02: qty 10

## 2012-11-02 MED ORDER — SODIUM CHLORIDE 0.9 % IV SOLN
80.0000 mg/m2 | Freq: Once | INTRAVENOUS | Status: AC
  Administered 2012-11-02: 144 mg via INTRAVENOUS
  Filled 2012-11-02: qty 24

## 2012-11-02 MED ORDER — INSULIN REGULAR HUMAN 100 UNIT/ML IJ SOLN
10.0000 [IU] | Freq: Once | INTRAMUSCULAR | Status: AC
  Administered 2012-11-02: 10 [IU] via SUBCUTANEOUS
  Filled 2012-11-02: qty 0.1

## 2012-11-02 MED ORDER — SODIUM CHLORIDE 0.9 % IV SOLN
Freq: Once | INTRAVENOUS | Status: AC
  Administered 2012-11-02: 14:00:00 via INTRAVENOUS

## 2012-11-02 MED ORDER — SODIUM CHLORIDE 0.9 % IV SOLN
1000.0000 mL | Freq: Once | INTRAVENOUS | Status: AC
  Administered 2012-11-02: 1000 mL via INTRAVENOUS

## 2012-11-02 MED ORDER — FAMOTIDINE IN NACL 20-0.9 MG/50ML-% IV SOLN
20.0000 mg | Freq: Once | INTRAVENOUS | Status: AC
  Administered 2012-11-02: 20 mg via INTRAVENOUS

## 2012-11-02 MED ORDER — DIPHENHYDRAMINE HCL 50 MG/ML IJ SOLN
50.0000 mg | Freq: Once | INTRAMUSCULAR | Status: AC
  Administered 2012-11-02: 50 mg via INTRAVENOUS

## 2012-11-02 MED ORDER — HEPARIN SOD (PORK) LOCK FLUSH 100 UNIT/ML IV SOLN
500.0000 [IU] | Freq: Once | INTRAVENOUS | Status: AC | PRN
  Administered 2012-11-02: 500 [IU]
  Filled 2012-11-02: qty 5

## 2012-11-02 MED ORDER — ONDANSETRON 16 MG/50ML IVPB (CHCC)
16.0000 mg | Freq: Once | INTRAVENOUS | Status: AC
  Administered 2012-11-02: 16 mg via INTRAVENOUS

## 2012-11-02 NOTE — Patient Instructions (Addendum)
[facility] Discharge Instructions for Patients Receiving Chemotherapy  Today you received the following chemotherapy agents taxol.  To help prevent nausea and vomiting after your treatment, we encourage you to take your nausea medication zofran. Begin taking it in the morning and take it as often as prescribed.   If you develop nausea and vomiting that is not controlled by your nausea medication, call the clinic. If it is after clinic hours your family physician or the after hours number for the clinic or go to the Emergency Department.   BELOW ARE SYMPTOMS THAT SHOULD BE REPORTED IMMEDIATELY:  *FEVER GREATER THAN 100.5 F  *CHILLS WITH OR WITHOUT FEVER  NAUSEA AND VOMITING THAT IS NOT CONTROLLED WITH YOUR NAUSEA MEDICATION  *UNUSUAL SHORTNESS OF BREATH  *UNUSUAL BRUISING OR BLEEDING  TENDERNESS IN MOUTH AND THROAT WITH OR WITHOUT PRESENCE OF ULCERS  *URINARY PROBLEMS  *BOWEL PROBLEMS  UNUSUAL RASH Items with * indicate a potential emergency and should be followed up as soon as possible.. Feel free to call the clinic you have any questions or concerns. The clinic phone number is [phone].   I have been informed and understand all the instructions given to me. I know to contact the clinic, my physician, or go to the Emergency Department if any problems should occur. I do not have any questions at this time, but understand that I may call the clinic during office hours   should I have any questions or need assistance in obtaining follow up care.    __________________________________________  _____________  __________ Signature of Patient or Authorized Representative            Date                   Time    __________________________________________ Nurse's Signature

## 2012-11-02 NOTE — Progress Notes (Signed)
Patient starting IV fluids with premeds while waiting for results of glucose.

## 2012-11-02 NOTE — Progress Notes (Signed)
OFFICE PROGRESS NOTE   11/02/2012   Physicians:W.Brewster, C.Leone Payor, PCP @ [facility]; D.Newman/ E.Wilson   INTERVAL HISTORY:   Patient is seen, together with granddaughter and another family member, in continuing attention to her IIIB high grade serous ovarian carcinoma and synchronous node + colon cancer. She is for last planned adjuvant weekly taxol today; she has had no systemic treatment thus far for the colon cancer. She remains very fatigued with minimal activity, as she has been since surgery. Blood sugars are elevated with steroids for chemo, appetite still poor, no diarrhea.   Oncologic History  Patient presented with GI symptoms thought IBD in April 2013. She had colonoscopy by Dr Stan Head 01-26-12, with large ulcer in ascending colon, pathology [id number] found adenocarcinoma consistent with noncolonic primary. CT AP (with contrast) in [facility] 02-02-12 had complex solid and cystic left adnexal mass, right ovarian mass, omental caking and peritoneal carcinomatosis. She was seen by Dr Nelly Rout on 02-09-12, with CA 125 of 22,938 and CEA 0.5; CMET resulted after Dr Forrestine Him visit had BUN 63 and creatinine 8.78. She was admitted to [facility] 7-31 thru 02-21-12 with acute renal failure felt multifactorial including prerenal with N/V/diarrhea/ascites, NSAID and ACE inhibitors, lasix, IV contrast, HTN and DM. Renal function progressively improved, allowing addition of careful carboplatin to taxol beginning cycle 3 chemotherapy. She had paracentesis of 5 liters on 02-11-12 and 2.4 liters on 02-18-12. CA 125, which was 22,938 at presentation, was down to 1835 prior to cycle 4 (lab done 04-04-12) and is 409 by lab resulting after visit today. She had first 2 cycles of weekly taxol as single agent following the renal problems on presentation, first cycle started on 02-24-12, tolerated adequately tho blood sugars elevate significantly with her diabetes and the steroids. She had carboplatin  added day 1 beginning cycle 3 (04-07-12). Last chemotherapy was 06-23-12 she did not require gCSF with the neoadjuvant treatment. Surgery by Dr Nelly Rout was 07-26-12, complete debulking of left ovarian primary and also right hemicolectomy with reanastomosis and 2 lymph node evaluation for the synchronous primary colon cancer. She had first weekly taxol on 09-21-12, then day 8 held as she was hospitalized 3-14 thru 09-29-12 for abdominal pain and C diff negative diarrhea, which all resolved with supportive care; she had day 15 taxol on 10-05-12,and has had cycle 2 on schedule, due day 15 cycle 2 today.   She had feraheme 09-27-12.   Patient feels generally tired with activity, without shortness of breath or chest pain. She has not improved from standpoint of fatigue since she has been on chemo after surgery. She has been drinking 2 Ensure daily but po intake otherwise limited and has not tried to follow diabetic diet closely. No bleeding. No abdominal or pelvic pain. Constipated x 2 days earlier this week. No LE swelling. No fever or symptoms of infection. Remainder of 10 point Review of Systems negative.  Objective:  Vital signs in last 24 hours:  BP 124/76  Pulse 102  Temp(Src) 98 F (36.7 C) (Oral)  Resp 20  Ht 5\' 6"  (1.676 m)  Wt 148 lb 4.8 oz (67.268 kg)  BMI 23.95 kg/m2  Pale, not icteric, again in [facility], looks tired despite premed steroids last pm. Partial alopecia. PERRL. Oral mucosa moist and is drinking diet soda now.   HEENT:PERRLA, sclera clear, anicteric and oropharynx clear, no lesions LymphaticsCervical, supraclavicular, and axillary nodes normal. Resp: clear to auscultation bilaterally and normal percussion bilaterally Cardio: regular rate and rhythm GI: soft, non-tender;  bowel sounds normal; no masses,  no organomegaly Extremities: extremities normal, atraumatic, no cyanosis or edema Neuro:no progressive peripheral neuropathy. Moves all extremities easily in [facility] Skin without rash  or ecchymosis Portacath-without erythema or tenderness  Lab Results:  Results for orders placed in visit on 11/02/12  CBC WITH DIFFERENTIAL      Result Value Range   WBC 5.2  3.9 - 10.3 10e3/uL   NEUT# 4.5  1.5 - 6.5 10e3/uL   HGB 11.0 (*) 11.6 - 15.9 g/dL   HCT 14.7  82.9 - 56.2 %   Platelets 513 (*) 145 - 400 10e3/uL   MCV 97.0  79.5 - 101.0 fL   MCH 30.5  25.1 - 34.0 pg   MCHC 31.4 (*) 31.5 - 36.0 g/dL   RBC 1.30 (*) 8.65 - 7.84 10e6/uL   RDW 14.8 (*) 11.2 - 14.5 %   lymph# 0.6 (*) 0.9 - 3.3 10e3/uL   MONO# 0.2  0.1 - 0.9 10e3/uL   Eosinophils Absolute 0.0  0.0 - 0.5 10e3/uL   Basophils Absolute 0.0  0.0 - 0.1 10e3/uL   NEUT% 86.0 (*) 38.4 - 76.8 %   LYMPH% 10.9 (*) 14.0 - 49.7 %   MONO% 2.9  0.0 - 14.0 %   EOS% 0.0  0.0 - 7.0 %   BASO% 0.2  0.0 - 2.0 %  WHOLE BLOOD GLUCOSE      Result Value Range   Glucose    70 - 100 mg/dL   Value: Above linear limit, confirmation by reference method to follow   HRS PC 2.0      First blood sugar today 486, covered with SS insulin and IV NS, repeat 374. All discussed with infusion RN following visit.  CMET available after visit Na 132 (corrects with glucose) cl 94, Tbili 0.1, LFTs WNL, alb still 2.8  CA 125 available after visit 16.4, this 409 in Oct 2013  Studies/Results:  No results found.  Medications: I have reviewed the patient's current medications. Per [facility] financial staff, will send prescription for xeloda to [facility] when ready for that drug.  I am concerned that PS is still ECOG 2-3. We will take at least next 4 weeks off from chemo, then decide re starting single agent xeloda for the node + colon cancer when I see her back in May. Patient is relieved with this plan.  Assessment/Plan:  1.high grade serous carcinoma involving bilateral ovaries and tubes T3bNx at complete debulking surgery after neoadjuvant taxol/ carboplatin: Completing second cycle of taxol since surgery, single taxol chosen due to poor PS now, as she  tolerated this better than carboplatin previously.  She is to see Dr Nelly Rout on 11-08-12.  2.T4aN1a adenocarcinoma of ascending colon: other information as above, CEA not elevated. Consider adjuvant xeloda depending on PS by May.  3.diabetes: blood sugar addressed as above now.  4.ARF shortly after presentation Aug 2013, multifactorial and resolved back to baseline  5.HTN  6.PAC in  7.hospitalization 3-14 thru 09-29-12 with abdominal pain which resolved with conservative interventions, CT in hospital with question of internal hernia, no apparent symptoms now. Gyn oncology aware of situation during hospitalization and general surgery followed.  8.general debility related to all of above and po intake not optimal. Has had follow up with dietician, family very supportive. Patient has declined home PT previously, but we may need to consider that again. 9. Multifactorial anemia: post IV feraheme 09-27-12 Patient and family were in agreement with plan above.  Time spent 25  min including >50% discussion and coordination of care.  Reece Packer, MD   11/02/2012, 3:34 PM

## 2012-11-02 NOTE — Telephone Encounter (Signed)
gv and pritned appt sched and avs for pt...sent pt back to the lab °

## 2012-11-07 ENCOUNTER — Telehealth: Payer: Self-pay | Admitting: Gynecologic Oncology

## 2012-11-07 NOTE — Telephone Encounter (Signed)
Office Visit Note: Gyn-Onc    Ann Baldwin 67 y.o. female  CC:  Synchronous ovarian/colon cancer.  Assessment/Plan:  [name]  is a 67 y.o.  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  She underwent interval debulking 07/26/2012  and findings were consistent with a synchronous ovarian and ascending colon malignancy. Postoperative course was complicated by either bowel obstruction or ischemia that spontaneously resolved.     HPI: 67 y.o.  y/o G2P2 LNMP 47's  presented in April 2013 with abdominal discomfort, diarrhea, nausea, no constipation  that was thought to be IBD. The symptoms persisted and she was referred for colonoscopy 01/26/2012.  Findings were notable for  A large ulcer with heaped up margins in the ascending colon.  It was difficult to see and to obtain biopsies.    Pathology was c/w adenocarcinoma and the concern that it was not a primary colonic adenocarcinoma.    CT abdomen and pelvis 02/01/2012 with a 5x6.2 cmcomplex solid and cystic left adnexal mass, 3.9x2.9 right ovarian soft tissue mass, omental caking and peritoneal carcinomatosis.    The initial CA 125 returned > 22,000 CEA wnl, immuno stains of the biopsy were not c/w colon cancer.  Cr was > 8 and the patient was admitted with acute renal failure. Cytology from paracentesis was c/w Gyn primary. She has received 6 cycles of dose dense Taxol/carboplatin. Cycle 6was administered on 06/23/2012. CA 125 34.7 06/2012  A CT scan of the abdomen and pelvis collected on 05/12/2012 IMPRESSION:  1. Interval response to therapy. There has been near complete resolution of abdominal ascites and peritoneal nodularity. 2. Interval decrease in the size of bilateral adnexal cystic lesions. 3. Small soft tissue attenuating nodule arising from the uterine fundus is unchanged in size from previous exam and  is favored to represent a subserosal (exophytic) fibroid. 4. No new or progressive disease identified. 5. Small subpleural nodule in the right middle lobe is stable from previous exam.   In July 26 2012 she underwent total abdominal hysterectomy bilateral salpingo-oophorectomy omentectomy resection of the midportion of the ascending colon with ileotransverse enterocolostomy. 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  Postoperatively she was diagnosed with pneumonia and a UTI.   Earlier in March she was admitted with abdominal pain. The differential diagnosis as well as ischemia versus obstruction. This process resolved with conservative management. Patient states that she feels well as had 2 bowel movements today, has a great appetite. Chemotherapy is poorly tolerated  Past Surgical Hx:  Past Surgical History  Procedure Laterality Date  . No past surgeries    . Laparotomy  07/26/2012    Procedure: EXPLORATORY LAPAROTOMY;  Surgeon: Laurette Schimke, MD PHD;  Location: [facility];  Service: Gynecology;  Laterality: N/A;  Exploratory Laporatomy/Total Abdominal Hysterectomy Bilatderal Salpingo Oopherectomy/Tumor Debulking  . Abdominal hysterectomy  07/26/2012    Procedure: HYSTERECTOMY ABDOMINAL;  Surgeon: Laurette Schimke, MD PHD;  Location: [facility];  Service: Gynecology;;  . Salpingoophorectomy  07/26/2012    Procedure: SALPINGO OOPHORECTOMY;  Surgeon: Laurette Schimke, MD PHD;  Location: [facility];  Service: Gynecology;  Laterality: Bilateral;  . Omentectomy  07/26/2012    Procedure: OMENTECTOMY;  Surgeon: Laurette Schimke,  MD PHD;  Location: [facility];  Service: Gynecology;;  . Colostomy revision  07/26/2012    Procedure: COLON RESECTION RIGHT;  Surgeon: Laurette Schimke, MD PHD;   Location: [facility];  Service: Gynecology;;  with reanastomsis    Past Medical Hx:  Past Medical History  Diagnosis Date  . Diabetes mellitus   . Hyperlipidemia   . Ovarian cancer   . Hypertension     no current meds for since aug 2013  . Anemia   . Colon cancer   Last mammogram 2 years ago wnl.  Past Gynecological History: G2 P2 LNMP in 40's.  Menarche 12 regular menses until menopause.  No contraceptives used.  No h/o abnormal pap test, Last pap 10 years ago.  NSVD x 2.  No LMP recorded. Patient has had a hysterectomy.  Family Hx:  Family History  Problem Relation Age of Onset  . Colon cancer Neg Hx   . Esophageal cancer Neg Hx   . Stomach cancer Neg Hx   . Rectal cancer Neg Hx     Review of Systems:  Constitutional  Feels very tired but much improved last visit.  Appetite has improved Cardiovascular  No chest pain, shortness of breath,  Pulmonary  No cough or wheeze.  Gastro Intestinal  Denies nausea,  No bright red blood per rectum.  No constipation, no diarrhea,   Genito Urinary  No vaginal bleeding or discharge ID  no fever or chills  Vitals:  Blood pressure 136/74, pulse 78, temperature 97.8 F (36.6 C), temperature source Oral, resp. rate 16, height 5\' 6"  (1.676 m), weight 153 lb 11.2 oz (69.718 kg).  PHYSICAL EXAMINATION  Neck  Supple without any enlargements.  Cardiovascular  Pulse normal rate, regularity and rhythm. S1 and S2 normal, without any murmur, rub, or gallop.  Lungs  Clear to auscultation bilateraly, without wheezes/crackles/rhonchi. Good air movement.  Skin  No rash/lesions/breakdown  Psychiatry  Alert and oriented to person, place, and time  Abdomen  Normoactive bowel sounds, abdomen soft, non-tender and obese. Surgical  sites intact without evidence of hernia.  Genito Urinary  Vulva/vagina: Normal external female genitalia.  No lesions. No discharge or bleeding.  Bladder/urethra:  No lesions or masses  Cervix: Normal appearing, no  lesions.  Uterus: Small, mobile, no parametrial involvement or nodularity.  Adnexa: No palpable masses. Rectal  Good tone, no masses no cul de sac nodularity.  Extremities  No bilateral cyanosis, clubbing or edema. No rash, lesions or petiche.    Laurette Schimke, MD, PhD 11/07/2012, 9:43 PM

## 2012-11-08 ENCOUNTER — Encounter: Payer: Self-pay | Admitting: Gynecologic Oncology

## 2012-11-08 ENCOUNTER — Ambulatory Visit: Payer: Medicare Other | Attending: Gynecologic Oncology | Admitting: Gynecologic Oncology

## 2012-11-08 VITALS — BP 116/70 | HR 72 | Temp 98.5°F | Resp 16 | Ht 66.0 in | Wt 146.0 lb

## 2012-11-08 DIAGNOSIS — C189 Malignant neoplasm of colon, unspecified: Secondary | ICD-10-CM | POA: Insufficient documentation

## 2012-11-08 DIAGNOSIS — C569 Malignant neoplasm of unspecified ovary: Secondary | ICD-10-CM

## 2012-11-08 NOTE — Progress Notes (Signed)
Office Visit Note: Gyn-Onc    Ann Baldwin 67 y.o. female  CC:  Synchronous ovarian/colon cancer.  Assessment/Plan:  [name]  is a 67 y.o.  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  She underwent interval debulking 07/26/2012  and findings were consistent with a synchronous ovarian and ascending colon malignancy. Postoperative course was complicated by either bowel obstruction caused by an internal hernia or ischemia that spontaneously resolved.  The patient completed chemotherapy last week.  Patient was counseled that she is currently in remission without any evidence of disease. The signs and symptoms suggestive of recurrent disease were discussed with the patient and her niece. She is advised that if recurrent ovarian cancer is identified strategies may be inclusive of debulking versus chemotherapy based on the time to progression.  All of her questions were answered  Patient advised to followup with Dr. Darrold Span in May of 2014 and to followup with GYN oncology in August of 2014.     HPI: 67 y.o.  y/o G2P2 LNMP 11's  presented in April 2013 with abdominal discomfort, diarrhea, nausea, no constipation  that was thought to be IBD. The symptoms persisted and she was referred for colonoscopy 01/26/2012.  Findings were notable for  A large ulcer with heaped up margins in the ascending colon.  It was difficult to see and to obtain biopsies.    Pathology was c/w adenocarcinoma and the concern that it was not a primary colonic adenocarcinoma.    CT abdomen and pelvis 02/01/2012 with a 5x6.2 cmcomplex solid and cystic left adnexal mass, 3.9x2.9 right ovarian soft tissue mass, omental caking and peritoneal carcinomatosis.    The initial CA 125 returned > 22,000 CEA wnl, immuno stains of the biopsy were not c/w colon cancer.  Cr was > 8 and the patient was  admitted with acute renal failure. Cytology from paracentesis was c/w Gyn primary. She has received 6 cycles of dose dense Taxol/carboplatin. Cycle 6was administered on 06/23/2012. CA 125 34.7 06/2012  A CT scan of the abdomen and pelvis collected on 05/12/2012 IMPRESSION:  1. Interval response to therapy. There has been near complete resolution of abdominal ascites and peritoneal nodularity. 2. Interval decrease in the size of bilateral adnexal cystic lesions. 3. Small soft tissue attenuating nodule arising from the uterine fundus is unchanged in size from previous exam and is favored to represent a subserosal (exophytic) fibroid. 4. No new or progressive disease identified. 5. Small subpleural nodule in the right middle lobe is stable from previous exam.   In July 26 2012 she underwent total abdominal hysterectomy bilateral salpingo-oophorectomy omentectomy resection of the midportion of the ascending colon with ileotransverse enterocolostomy. 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  Postoperatively she was diagnosed with pneumonia and a UTI.   Earlier in March she was admitted with abdominal pain. The differential diagnosis as well as ischemia versus obstruction. This process resolved with conservative management. Patient states that she feels well.  She reports residual fatigue.   Past Surgical Hx:  Past Surgical History  Procedure Laterality Date  . No past surgeries    . Laparotomy  07/26/2012    Procedure: EXPLORATORY LAPAROTOMY;  Surgeon: Toniann Fail  Nelly Rout, MD PHD;  Location: [facility];  Service: Gynecology;  Laterality: N/A;  Exploratory Laporatomy/Total Abdominal Hysterectomy Bilatderal Salpingo Oopherectomy/Tumor Debulking  . Abdominal hysterectomy   07/26/2012    Procedure: HYSTERECTOMY ABDOMINAL;  Surgeon: Laurette Schimke, MD PHD;  Location: [facility];  Service: Gynecology;;  . Salpingoophorectomy  07/26/2012    Procedure: SALPINGO OOPHORECTOMY;  Surgeon: Laurette Schimke, MD PHD;  Location: [facility];  Service: Gynecology;  Laterality: Bilateral;  . Omentectomy  07/26/2012    Procedure: OMENTECTOMY;  Surgeon: Laurette Schimke, MD PHD;  Location: [facility];  Service: Gynecology;;  . Colostomy revision  07/26/2012    Procedure: COLON RESECTION RIGHT;  Surgeon: Laurette Schimke, MD PHD;  Location: [facility];  Service: Gynecology;;  with reanastomsis    Past Medical Hx:  Past Medical History  Diagnosis Date  . Diabetes mellitus   . Hyperlipidemia   . Ovarian cancer   . Hypertension     no current meds for since aug 2013  . Anemia   . Colon cancer   Last mammogram 2 years ago wnl.  Past Gynecological History: G2 P2 LNMP in 40's.  Menarche 12 regular menses until menopause.  No contraceptives used.  No h/o abnormal pap test, Last pap 10 years ago.  NSVD x 2.  No LMP recorded. Patient has had a hysterectomy.  Family Hx:  Family History  Problem Relation Age of Onset  . Colon cancer Neg Hx   . Esophageal cancer Neg Hx   . Stomach cancer Neg Hx   . Rectal cancer Neg Hx     Review of Systems:  Constitutional  Feels very tired but much improved last visit.  Appetite has improved Cardiovascular  No chest pain, shortness of breath,  Pulmonary  No cough or wheeze.  Gastro Intestinal  Denies nausea,  No bright red blood per rectum.  No constipation, no diarrhea,   Genito Urinary  No vaginal bleeding or discharge ID  no fever or chills  Vitals:  Blood pressure 116/70, pulse 72, temperature 98.5 F (36.9 C), temperature source Oral, resp. rate 16, height 5\' 6"  (1.676 m), weight 146 lb (66.225 kg).  PHYSICAL EXAMINATION  Neck  Supple without any enlargements.  Cardiovascular  Pulse normal rate, regularity and rhythm. S1 and S2 normal, without  any murmur, rub, or gallop.  Lungs  Clear to auscultation bilateraly, without wheezes/crackles/rhonchi. Good air movement.  Skin  No rash/lesions/breakdown  Psychiatry  Alert and oriented to person, place, and time  Abdomen  Normoactive bowel sounds, abdomen soft, non-tender and obese. Surgical  sites intact without evidence of hernia. No ascites or palpable masses Extremities  No bilateral cyanosis, clubbing or edema. No rash, lesions or petiche.    Laurette Schimke, MD, PhD 11/08/2012, 4:48 PM

## 2012-11-08 NOTE — Patient Instructions (Signed)
Followup with Dr. Darrold Span in May of 2014 Followup with GYN oncology in August of 2014.

## 2012-11-09 ENCOUNTER — Other Ambulatory Visit: Payer: Self-pay | Admitting: *Deleted

## 2012-11-09 DIAGNOSIS — C569 Malignant neoplasm of unspecified ovary: Secondary | ICD-10-CM

## 2012-11-09 MED ORDER — LORAZEPAM 0.5 MG PO TABS: 0.5000 mg | ORAL_TABLET | Freq: Four times a day (QID) | ORAL | Status: DC | PRN

## 2012-11-09 MED ORDER — HYDROCODONE-ACETAMINOPHEN 5-325 MG PO TABS: 1.0000 | ORAL_TABLET | Freq: Four times a day (QID) | ORAL | Status: DC | PRN

## 2012-11-22 ENCOUNTER — Other Ambulatory Visit: Payer: Self-pay | Admitting: *Deleted

## 2012-11-22 ENCOUNTER — Other Ambulatory Visit: Payer: Self-pay | Admitting: Oncology

## 2012-11-22 DIAGNOSIS — C569 Malignant neoplasm of unspecified ovary: Secondary | ICD-10-CM

## 2012-11-22 MED ORDER — HYDROCODONE-ACETAMINOPHEN 5-325 MG PO TABS: 1.0000 | ORAL_TABLET | Freq: Four times a day (QID) | ORAL | Status: DC | PRN

## 2012-11-29 ENCOUNTER — Telehealth: Payer: Self-pay | Admitting: Oncology

## 2012-11-29 ENCOUNTER — Ambulatory Visit (HOSPITAL_BASED_OUTPATIENT_CLINIC_OR_DEPARTMENT_OTHER): Payer: Medicare Other | Admitting: Oncology

## 2012-11-29 ENCOUNTER — Other Ambulatory Visit (HOSPITAL_BASED_OUTPATIENT_CLINIC_OR_DEPARTMENT_OTHER): Payer: Medicare Other | Admitting: Lab

## 2012-11-29 VITALS — BP 141/81 | HR 87 | Temp 98.3°F | Resp 18 | Ht 66.0 in | Wt 152.3 lb

## 2012-11-29 DIAGNOSIS — C569 Malignant neoplasm of unspecified ovary: Secondary | ICD-10-CM

## 2012-11-29 DIAGNOSIS — N179 Acute kidney failure, unspecified: Secondary | ICD-10-CM

## 2012-11-29 DIAGNOSIS — C562 Malignant neoplasm of left ovary: Secondary | ICD-10-CM

## 2012-11-29 DIAGNOSIS — C182 Malignant neoplasm of ascending colon: Secondary | ICD-10-CM

## 2012-11-29 DIAGNOSIS — E119 Type 2 diabetes mellitus without complications: Secondary | ICD-10-CM

## 2012-11-29 LAB — CBC WITH DIFFERENTIAL/PLATELET
BASO%: 0.4 % (ref 0.0–2.0)
Basophils Absolute: 0 10*3/uL (ref 0.0–0.1)
HCT: 35.2 % (ref 34.8–46.6)
HGB: 11.7 g/dL (ref 11.6–15.9)
MCHC: 33.2 g/dL (ref 31.5–36.0)
MONO#: 0.5 10*3/uL (ref 0.1–0.9)
NEUT%: 70.5 % (ref 38.4–76.8)
RDW: 15.8 % — ABNORMAL HIGH (ref 11.2–14.5)
WBC: 7.2 10*3/uL (ref 3.9–10.3)
lymph#: 1.5 10*3/uL (ref 0.9–3.3)

## 2012-11-29 NOTE — Telephone Encounter (Signed)
gv pt appt schedule for June/August.

## 2012-12-01 NOTE — Progress Notes (Signed)
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 She has had neoadjuvant chemotherapy, surgery and some additional adjuvant chemotherapy for the ovarian cancer, and surgery only for the colon cancer. Performance status is still ECOG 3. She has PAC in.  Oncologic History  Patient presented with GI symptoms thought IBD in April 2013. She had colonoscopy by Dr Stan Head 01-26-12, with large ulcer in ascending colon, pathology [phone] found adenocarcinoma consistent with noncolonic primary. CT AP (with contrast) in [facility] 02-02-12 had complex solid and cystic left adnexal mass, right ovarian mass, omental caking and peritoneal carcinomatosis. She was seen by Dr Nelly Rout on 02-09-12, with CA 125 of 22,938 and CEA 0.5; CMET resulted after Dr Forrestine Him visit had BUN 63 and creatinine 8.78. She was admitted to [facility] 7-31 thru 02-21-12 with acute renal failure felt multifactorial including prerenal with N/V/diarrhea/ascites, NSAID and ACE inhibitors, lasix, IV contrast, HTN and DM. Renal function progressively improved, allowing addition of careful carboplatin to taxol beginning cycle 3 chemotherapy. She had paracentesis of 5 liters on 02-11-12 and 2.4 liters on 02-18-12. CA 125, which was 22,938 at presentation, was down to 1835 prior to cycle 4 (lab done 04-04-12) and is 409 by lab resulting after visit today. She had first 2 cycles of weekly taxol as single agent following the renal problems on presentation, first cycle started on 02-24-12, tolerated adequately tho blood sugars elevate significantly with her diabetes and the steroids. She had carboplatin added day 1 beginning cycle 3 (04-07-12). Last  chemotherapy was 06-23-12 she did not require gCSF with the neoadjuvant treatment. Surgery by Dr Nelly Rout was 07-26-12, complete debulking of left ovarian primary and also right hemicolectomy with reanastomosis and 2 lymph node evaluation for the synchronous primary colon cancer. She had first weekly taxol on 09-21-12, then day 8 held as she was hospitalized 3-14 thru 09-29-12 for abdominal pain and C diff negative diarrhea, which all resolved with supportive care; she had additional weekly taxol from 3-26 thru 11-02-12. She had feraheme 09-27-12. Last CT AP was 09-23-12.  Patient is gradually improving, but is still having nausea requiring antiemetics daily. She walked next door and sat with neighbor for ~ an hour yesterday, which was the most activity she has done. Appetite is a little better but not back to baseline. She has not had diarrhea recently. She is fatigued with activity, not specifically SOB. She has had no fever. PAC seems fine. No bleeding. No LE swelling. Blood sugars are lower than the 350 -500 range that she had during chemo. Remainder of 10 point Review of Systems negative.  Objective:  Vital signs in last 24 hours:  BP 141/81  Pulse 87  Temp(Src) 98.3 F (36.8 C) (Oral)  Resp 18  Ht 5\' 6"  (1.676 m)  Wt 152 lb 4.8 oz (69.083 kg)  BMI 24.59 kg/m2  Weight is up 6 lbs. She walks from exam room to [facility] in hall without assistance. Respirations not labored RA.   HEENT:PERRLA, sclera clear, anicteric and oropharynx clear, no lesions LymphaticsCervical, supraclavicular, and axillary nodes normal. Resp: clear to auscultation bilaterally and normal percussion bilaterally Cardio: regular rate and rhythm GI: soft, quiet, not distended. Midline incision closed, no erythema or significant tenderness Extremities: extremities normal, atraumatic,  no cyanosis or edema Neuro:no sensory deficits noted Skin without rash or ecchymosis Portacath -without erythema or tenderness  Lab  Results:  Results for orders placed in visit on 11/29/12  CBC WITH DIFFERENTIAL      Result Value Range   WBC 7.2  3.9 - 10.3 10e3/uL   NEUT# 5.1  1.5 - 6.5 10e3/uL   HGB 11.7  11.6 - 15.9 g/dL   HCT 16.1  09.6 - 04.5 %   Platelets 315  145 - 400 10e3/uL   MCV 97.0  79.5 - 101.0 fL   MCH 32.2  25.1 - 34.0 pg   MCHC 33.2  31.5 - 36.0 g/dL   RBC 4.09 (*) 8.11 - 9.14 10e6/uL   RDW 15.8 (*) 11.2 - 14.5 %   lymph# 1.5  0.9 - 3.3 10e3/uL   MONO# 0.5  0.1 - 0.9 10e3/uL   Eosinophils Absolute 0.2  0.0 - 0.5 10e3/uL   Basophils Absolute 0.0  0.0 - 0.1 10e3/uL   NEUT% 70.5  38.4 - 76.8 %   LYMPH% 20.0  14.0 - 49.7 %   MONO% 6.9  0.0 - 14.0 %   EOS% 2.2  0.0 - 7.0 %   BASO% 0.4  0.0 - 2.0 %   nRBC 0  0 - 0 %    Last ca 125 was 11-02-12, 16.4  Studies/Results:  No results found.  Medications: I have reviewed the patient's current medications.  We have discussed considerations for adjuvant chemotherapy for the colon cancer, now out 4 months from surgery which included total of just 2 lymph nodes (as the colon cancer diagnosis was unexpected based on all of prior work up). Performance status is still tenuous to attempt even single agent xeloda, especially given all of recent treatment for the high grade gyn cancer and her other comorbidities. I have discussed situation with my collegues in medical oncology, who also feel that the gyn cancer is more likely to recur with significant impact than the colon cancer. Patient is not anxious to have further chemotherapy, even single agent oral xeloda. After careful discussion, we have decided to follow with close observation without other treatment for now. She will keep PAC in, with flushes every 6-8 weeks, and can have blood draws including CA 125 and CEA coordinated with the flushes. From the standpoint of the colon cancer, she should be seen ~ q 3 months.  Assessment/Plan: 1. 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 Last CT AP was 09-23-12. Lat CA 125 11-02-12 was 16.4. She will see Dr Nelly Rout next on 12-27-12. The gyn pathology shows microsatellite instability. 2. T4aN1a adenocarcinoma of ascending colon: post resection 07-26-2012 with involvement of 1 of 2 regional nodes, no microsatellite instability in this specimen. To follow with close observation rather than try adjuvant chemotherapy. She needs to be seen by medical oncology for this follow up ~ every 3 months, with CEAs. 3.PAC in, needs flushes every 6-8 weeks 4.anemia: hemoglobin improving, still on oral iron in addition to the feraheme in March. 5.diabetes on insulin 6.acute renal failure multifactorial just after diagnosis of gyn cancer in August 2013, with creatinine >8 then, resolved but still need to be cautious of renal toxic interventions 7.hx HTN   Patient and  family understood discussion, had all questions answered to their satisfaction and are in agreement with plan above. I was not aware of the additional path information re microsatellite instability until after visit and did not discuss this with them at this visit.  Reece Packer, MD

## 2012-12-03 ENCOUNTER — Other Ambulatory Visit: Payer: Self-pay | Admitting: Oncology

## 2012-12-04 ENCOUNTER — Encounter: Payer: Self-pay | Admitting: Oncology

## 2012-12-07 ENCOUNTER — Other Ambulatory Visit: Payer: Self-pay | Admitting: *Deleted

## 2012-12-07 DIAGNOSIS — C569 Malignant neoplasm of unspecified ovary: Secondary | ICD-10-CM

## 2012-12-07 MED ORDER — HYDROCODONE-ACETAMINOPHEN 5-325 MG PO TABS: 1.0000 | ORAL_TABLET | Freq: Four times a day (QID) | ORAL | Status: DC | PRN

## 2012-12-07 MED ORDER — LORAZEPAM 0.5 MG PO TABS: 0.5000 mg | ORAL_TABLET | Freq: Four times a day (QID) | ORAL | Status: DC | PRN

## 2012-12-07 NOTE — Telephone Encounter (Signed)
Patient reports she takes Ativan for nausea or sleep. Takes about #2 Vicodin/day to stay comfortable. Currently has #7 on hand.

## 2012-12-07 NOTE — Telephone Encounter (Addendum)
Refill requests for Ativan & Vicodin to MD desk for approval. MD requests we inquire with patient how she is taking meds.

## 2012-12-22 ENCOUNTER — Other Ambulatory Visit: Payer: Self-pay | Admitting: Oncology

## 2012-12-26 ENCOUNTER — Telehealth: Payer: Self-pay | Admitting: Gynecologic Oncology

## 2012-12-26 NOTE — Telephone Encounter (Signed)
Office Visit Note: Gyn-Onc    Ann Baldwin 67 y.o. female  CC:  Synchronous ovarian/colon cancer.  Assessment/Plan:  [name]  is a 67 y.o.  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  She underwent interval debulking 07/26/2012  and findings were consistent with a synchronous ovarian and ascending colon malignancy. Postoperative course was complicated by either bowel obstruction caused by an internal hernia or ischemia that spontaneously resolved.  The patient completed chemotherapy last week.  Patient was counseled that she is currently in remission without any evidence of disease. The signs and symptoms suggestive of recurrent disease were discussed with the patient and her niece. She is advised that if recurrent ovarian cancer is identified strategies may be inclusive of debulking versus chemotherapy based on the time to progression.  All of her questions were answered  Referral to Genetic counselling    HPI: 67 y.o.  y/o G2P2 LNMP 40's  presented in April 2013 with abdominal discomfort, diarrhea, nausea, no constipation  that was thought to be IBD. The symptoms persisted and she was referred for colonoscopy 01/26/2012.  Findings were notable for  A large ulcer with heaped up margins in the ascending colon.  It was difficult to see and to obtain biopsies.    Pathology was c/w adenocarcinoma and the concern that it was not a primary colonic adenocarcinoma.    CT abdomen and pelvis 02/01/2012 with a 5x6.2 cmcomplex solid and cystic left adnexal mass, 3.9x2.9 right ovarian soft tissue mass, omental caking and peritoneal carcinomatosis.    The initial CA 125 returned > 22,000 CEA wnl, immuno stains of the biopsy were not c/w colon cancer.  Cr was > 8 and the patient was admitted with acute renal failure. Cytology from paracentesis was c/w Gyn primary. She  has received 6 cycles of dose dense Taxol/carboplatin. Cycle 6was administered on 06/23/2012. CA 125 34.7 06/2012  A CT scan of the abdomen and pelvis collected on 05/12/2012 IMPRESSION:  1. Interval response to therapy. There has been near complete resolution of abdominal ascites and peritoneal nodularity. 2. Interval decrease in the size of bilateral adnexal cystic lesions. 3. Small soft tissue attenuating nodule arising from the uterine fundus is unchanged in size from previous exam and is favored to represent a subserosal (exophytic) fibroid. 4. No new or progressive disease identified. 5. Small subpleural nodule in the right middle lobe is stable from previous exam.   In July 26 2012 she underwent total abdominal hysterectomy bilateral salpingo-oophorectomy omentectomy resection of the midportion of the ascending colon with ileotransverse enterocolostomy. 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  Postoperatively she was diagnosed with pneumonia and a UTI.   Earlier in March she was admitted with abdominal pain. The differential diagnosis as well as ischemia versus obstruction. This process resolved with conservative management. Patient states that she feels well.  She reports residual fatigue.  Molecular evaluation of the colon cancer IHC: No loss of expression of MSH2 and MSH6, loss expression of PMS2 and MLH1. ([id number]) MSI PCR: Microsatellite instability-high (BAT-26, NR-21, BAT-25, MONO-27 and NR-24: unstable; [id number]).  Past Surgical Hx:  Past Surgical History  Procedure Laterality Date  . No past surgeries    .  Laparotomy  07/26/2012    Procedure: EXPLORATORY LAPAROTOMY;  Surgeon: Laurette Schimke, MD PHD;  Location: [facility];  Service: Gynecology;   Laterality: N/A;  Exploratory Laporatomy/Total Abdominal Hysterectomy Bilatderal Salpingo Oopherectomy/Tumor Debulking  . Abdominal hysterectomy  07/26/2012    Procedure: HYSTERECTOMY ABDOMINAL;  Surgeon: Laurette Schimke, MD PHD;  Location: [facility];  Service: Gynecology;;  . Salpingoophorectomy  07/26/2012    Procedure: SALPINGO OOPHORECTOMY;  Surgeon: Laurette Schimke, MD PHD;  Location: [facility];  Service: Gynecology;  Laterality: Bilateral;  . Omentectomy  07/26/2012    Procedure: OMENTECTOMY;  Surgeon: Laurette Schimke, MD PHD;  Location: [facility];  Service: Gynecology;;  . Colostomy revision  07/26/2012    Procedure: COLON RESECTION RIGHT;  Surgeon: Laurette Schimke, MD PHD;  Location: [facility];  Service: Gynecology;;  with reanastomsis    Past Medical Hx:  Past Medical History  Diagnosis Date  . Diabetes mellitus   . Hyperlipidemia   . Ovarian cancer   . Hypertension     no current meds for since aug 2013  . Anemia   . Colon cancer   Last mammogram 2 years ago wnl.  Past Gynecological History: G2 P2 LNMP in 40's.  Menarche 12 regular menses until menopause.  No contraceptives used.  No h/o abnormal pap test, Last pap 10 years ago.  NSVD x 2.  No LMP recorded. Patient has had a hysterectomy.  Family Hx:  Family History  Problem Relation Age of Onset  . Colon cancer Neg Hx   . Esophageal cancer Neg Hx   . Stomach cancer Neg Hx   . Rectal cancer Neg Hx     Review of Systems:  Constitutional  Feels very tired but much improved last visit.  Appetite has improved Cardiovascular  No chest pain, shortness of breath,  Pulmonary  No cough or wheeze.  Gastro Intestinal  Denies nausea,  No bright red blood per rectum.  No constipation, no diarrhea,   Genito Urinary  No vaginal bleeding or discharge ID  no fever or chills  Vitals:  Blood pressure 116/70, pulse 72, temperature 98.5 F (36.9 C), temperature source Oral, resp. rate 16, height 5\' 6"  (1.676 m), weight 146 lb (66.225  kg).  PHYSICAL EXAMINATION  Neck  Supple without any enlargements.  Cardiovascular  Pulse normal rate, regularity and rhythm. S1 and S2 normal, without any murmur, rub, or gallop.  Lungs  Clear to auscultation bilateraly, without wheezes/crackles/rhonchi. Good air movement.  Skin  No rash/lesions/breakdown  Psychiatry  Alert and oriented to person, place, and time  Abdomen  Normoactive bowel sounds, abdomen soft, non-tender and obese. Surgical  sites intact without evidence of hernia. No ascites or palpable masses Extremities  No bilateral cyanosis, clubbing or edema. No rash, lesions or petiche.    Laurette Schimke, MD, PhD 12/26/2012, 10:14 PM

## 2012-12-27 ENCOUNTER — Other Ambulatory Visit (HOSPITAL_BASED_OUTPATIENT_CLINIC_OR_DEPARTMENT_OTHER): Payer: Medicare Other | Admitting: Lab

## 2012-12-27 ENCOUNTER — Ambulatory Visit (HOSPITAL_BASED_OUTPATIENT_CLINIC_OR_DEPARTMENT_OTHER): Payer: Medicare Other

## 2012-12-27 ENCOUNTER — Encounter: Payer: Self-pay | Admitting: Gynecologic Oncology

## 2012-12-27 ENCOUNTER — Ambulatory Visit: Payer: Medicare Other | Attending: Gynecologic Oncology | Admitting: Gynecologic Oncology

## 2012-12-27 VITALS — BP 140/78 | HR 64 | Temp 98.6°F | Resp 16 | Ht 66.0 in | Wt 153.2 lb

## 2012-12-27 DIAGNOSIS — Z9221 Personal history of antineoplastic chemotherapy: Secondary | ICD-10-CM | POA: Insufficient documentation

## 2012-12-27 DIAGNOSIS — I10 Essential (primary) hypertension: Secondary | ICD-10-CM | POA: Insufficient documentation

## 2012-12-27 DIAGNOSIS — C569 Malignant neoplasm of unspecified ovary: Secondary | ICD-10-CM | POA: Insufficient documentation

## 2012-12-27 DIAGNOSIS — Z90710 Acquired absence of both cervix and uterus: Secondary | ICD-10-CM | POA: Insufficient documentation

## 2012-12-27 DIAGNOSIS — Z9079 Acquired absence of other genital organ(s): Secondary | ICD-10-CM | POA: Insufficient documentation

## 2012-12-27 DIAGNOSIS — C182 Malignant neoplasm of ascending colon: Secondary | ICD-10-CM

## 2012-12-27 DIAGNOSIS — E785 Hyperlipidemia, unspecified: Secondary | ICD-10-CM | POA: Insufficient documentation

## 2012-12-27 DIAGNOSIS — E119 Type 2 diabetes mellitus without complications: Secondary | ICD-10-CM | POA: Insufficient documentation

## 2012-12-27 DIAGNOSIS — C562 Malignant neoplasm of left ovary: Secondary | ICD-10-CM

## 2012-12-27 DIAGNOSIS — C189 Malignant neoplasm of colon, unspecified: Secondary | ICD-10-CM | POA: Insufficient documentation

## 2012-12-27 DIAGNOSIS — Z452 Encounter for adjustment and management of vascular access device: Secondary | ICD-10-CM

## 2012-12-27 LAB — COMPREHENSIVE METABOLIC PANEL (CC13)
ALT: 12 U/L (ref 0–55)
AST: 10 U/L (ref 5–34)
Albumin: 3.5 g/dL (ref 3.5–5.0)
Calcium: 10.1 mg/dL (ref 8.4–10.4)
Chloride: 106 mEq/L (ref 98–107)
Potassium: 4.2 mEq/L (ref 3.5–5.1)
Total Protein: 6.8 g/dL (ref 6.4–8.3)

## 2012-12-27 LAB — CBC WITH DIFFERENTIAL/PLATELET
Basophils Absolute: 0 10*3/uL (ref 0.0–0.1)
Eosinophils Absolute: 0.1 10*3/uL (ref 0.0–0.5)
HGB: 12.5 g/dL (ref 11.6–15.9)
MCV: 96.7 fL (ref 79.5–101.0)
MONO#: 0.6 10*3/uL (ref 0.1–0.9)
MONO%: 8.3 % (ref 0.0–14.0)
NEUT#: 5 10*3/uL (ref 1.5–6.5)
Platelets: 248 10*3/uL (ref 145–400)
RBC: 3.74 10*6/uL (ref 3.70–5.45)
RDW: 14.6 % — ABNORMAL HIGH (ref 11.2–14.5)
WBC: 6.9 10*3/uL (ref 3.9–10.3)

## 2012-12-27 MED ORDER — SODIUM CHLORIDE 0.9 % IJ SOLN
10.0000 mL | INTRAMUSCULAR | Status: DC | PRN
  Administered 2012-12-27: 10 mL via INTRAVENOUS
  Filled 2012-12-27: qty 10

## 2012-12-27 MED ORDER — HEPARIN SOD (PORK) LOCK FLUSH 100 UNIT/ML IV SOLN
500.0000 [IU] | Freq: Once | INTRAVENOUS | Status: AC
  Administered 2012-12-27: 500 [IU] via INTRAVENOUS
  Filled 2012-12-27: qty 5

## 2012-12-27 NOTE — Patient Instructions (Addendum)
Call MD for problems 

## 2012-12-27 NOTE — Progress Notes (Signed)
Office Visit Note: Gyn-Onc    Ann Baldwin 68 y.o. female  CC:  Synchronous ovarian/colon cancer.  Assessment/Plan:  [name]  is a 67 y.o.  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  She underwent interval debulking 07/26/2012  and findings were consistent with a synchronous ovarian and ascending colon malignancy. Postoperative course was complicated by either bowel obstruction caused by an internal hernia or ischemia that spontaneously resolved.  The patient completed chemotherapy last week.  Patient was counseled that she is currently in remission without any evidence of disease. The signs and symptoms suggestive of recurrent disease were discussed with the patient and her niece. She is advised that if recurrent ovarian cancer is identified strategies may be inclusive of debulking versus chemotherapy based on the time to progression.   Referral to Genetic counseling  Advised to take Pepcid twice daily    HPI: 67 y.o.  y/o G2P2 LNMP 40's  presented in April 2013 with abdominal discomfort, diarrhea, nausea, no constipation  that was thought to be IBD. The symptoms persisted and she was referred for colonoscopy 01/26/2012.  Findings were notable for  A large ulcer with heaped up margins in the ascending colon.  It was difficult to see and to obtain biopsies.    Pathology was c/w adenocarcinoma and the concern that it was not a primary colonic adenocarcinoma.    CT abdomen and pelvis 02/01/2012 with a 5x6.2 cmcomplex solid and cystic left adnexal mass, 3.9x2.9 right ovarian soft tissue mass, omental caking and peritoneal carcinomatosis.    The initial CA 125 returned > 22,000 CEA wnl, immuno stains of the biopsy were not c/w colon cancer.  Cr was > 8 and the patient was admitted with acute renal failure. Cytology from paracentesis was c/w Gyn primary.  She has received 6 cycles of dose dense Taxol/carboplatin. Cycle 6was administered on 06/23/2012. CA 125 34.7 06/2012  A CT scan of the abdomen and pelvis collected on 05/12/2012 IMPRESSION:  1. Interval response to therapy. There has been near complete resolution of abdominal ascites and peritoneal nodularity. 2. Interval decrease in the size of bilateral adnexal cystic lesions. 3. Small soft tissue attenuating nodule arising from the uterine fundus is unchanged in size from previous exam and is favored to represent a subserosal (exophytic) fibroid. 4. No new or progressive disease identified. 5. Small subpleural nodule in the right middle lobe is stable from previous exam.   In July 26 2012 she underwent total abdominal hysterectomy bilateral salpingo-oophorectomy omentectomy resection of the midportion of the ascending colon with ileotransverse enterocolostomy. 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  Postoperatively she was diagnosed with pneumonia and a UTI.   Earlier in March she was admitted with abdominal pain. The differential diagnosis as well as ischemia versus obstruction. This process resolved with conservative management. Patient states that she feels well.  She reports residual fatigue.  Molecular evaluation of the colon cancer IHC: No loss of expression of MSH2 and MSH6, loss expression of PMS2 and MLH1. ([id number]) MSI PCR: Microsatellite instability-high (BAT-26, NR-21, BAT-25, MONO-27 and NR-24: unstable; [id number]).  Past Surgical Hx:  Past Surgical History  Procedure Laterality Date  . No past  surgeries    . Laparotomy  07/26/2012    Procedure: EXPLORATORY LAPAROTOMY;  Surgeon: Laurette Schimke, MD PHD;  Location: [facility];  Service:  Gynecology;  Laterality: N/A;  Exploratory Laporatomy/Total Abdominal Hysterectomy Bilatderal Salpingo Oopherectomy/Tumor Debulking  . Abdominal hysterectomy  07/26/2012    Procedure: HYSTERECTOMY ABDOMINAL;  Surgeon: Laurette Schimke, MD PHD;  Location: [facility];  Service: Gynecology;;  . Salpingoophorectomy  07/26/2012    Procedure: SALPINGO OOPHORECTOMY;  Surgeon: Laurette Schimke, MD PHD;  Location: [facility];  Service: Gynecology;  Laterality: Bilateral;  . Omentectomy  07/26/2012    Procedure: OMENTECTOMY;  Surgeon: Laurette Schimke, MD PHD;  Location: [facility];  Service: Gynecology;;  . Colostomy revision  07/26/2012    Procedure: COLON RESECTION RIGHT;  Surgeon: Laurette Schimke, MD PHD;  Location: [facility];  Service: Gynecology;;  with reanastomsis    Past Medical Hx:  Past Medical History  Diagnosis Date  . Diabetes mellitus   . Hyperlipidemia   . Ovarian cancer   . Hypertension     no current meds for since aug 2013  . Anemia   . Colon cancer   Last mammogram 2 years ago wnl.  Past Gynecological History: G2 P2 LNMP in 40's.  Menarche 12 regular menses until menopause.  No contraceptives used.  No h/o abnormal pap test, Last pap 10 years ago.  NSVD x 2.  No LMP recorded. Patient has had a hysterectomy.  Family Hx:  Family History  Problem Relation Age of Onset  . Colon cancer Neg Hx   . Esophageal cancer Neg Hx   . Stomach cancer Neg Hx   . Rectal cancer Neg Hx     Review of Systems:  Constitutional  Feels very tired but much improved last visit.  Appetite has improved Cardiovascular  No chest pain, shortness of breath,  Pulmonary  No cough or wheeze.  Gastro Intestinal  Reports fairly persistent nausea,  No bright red blood per rectum.  No constipation, no diarrhea,  increased appetite and 6 pound weight gain Genito Urinary  No vaginal bleeding or discharge ID  no fever or chills  Vitals:  Blood pressure 140/78, pulse 64, temperature 98.6 F (37 C), temperature source Oral,  resp. rate 16, height 5\' 6"  (1.676 m), weight 153 lb 3.2 oz (69.491 kg).  PHYSICAL EXAMINATION  Neck  Supple without any enlargements.  Cardiovascular  Pulse normal rate, regularity and rhythm. S1 and S2 normal,   Lungs  Clear to auscultation bilateraly, without wheezes/crackles/rhonchi. Good air movement.  Skin  No rash/lesions/breakdown  Psychiatry  Alert and oriented to person, place, and time  Abdomen  Normoactive bowel sounds, abdomen soft, non-tender and obese. Surgical  sites intact without evidence of hernia. No ascites or palpable masses Genitala:  Normal external genitalia Bartholin's urethra and Skene's grade 1 cystocele and grade 1 rectocele evident no palpable pelvic masses or nodularity Rectal Good tone no masses no rectovaginal septum nodularity Extremities  No bilateral cyanosis, clubbing or edema. No rash, lesions or petiche.    Laurette Schimke, MD, PhD 12/27/2012, 3:29 PM

## 2012-12-28 LAB — CEA: CEA: 1.5 ng/mL (ref 0.0–5.0)

## 2012-12-28 LAB — CA 125: CA 125: 8.5 U/mL (ref 0.0–30.2)

## 2013-01-03 ENCOUNTER — Telehealth: Payer: Self-pay | Admitting: Genetic Counselor

## 2013-01-03 NOTE — Telephone Encounter (Signed)
S/W PT IN RE TO GENETIC APPT 08/11 @ 10 W/KAREN POWELL.  CALENDAR MAILED.

## 2013-01-09 ENCOUNTER — Telehealth: Payer: Self-pay | Admitting: *Deleted

## 2013-01-09 NOTE — Telephone Encounter (Signed)
Called pt to discuss pain medication refill request. States she is having "some pain in her stomach, occasionally legs ache" and occasionally she "feels the port, doesn't hurt, but she feels it". Is taking 1 pain pill each morning when she gets up and then 1 in the late afternoon. Pills do relieve pain.

## 2013-01-10 ENCOUNTER — Other Ambulatory Visit: Payer: Self-pay

## 2013-01-10 ENCOUNTER — Other Ambulatory Visit: Payer: Self-pay | Admitting: *Deleted

## 2013-01-10 DIAGNOSIS — C569 Malignant neoplasm of unspecified ovary: Secondary | ICD-10-CM

## 2013-01-10 MED ORDER — HYDROCODONE-ACETAMINOPHEN 5-325 MG PO TABS: 1.0000 | ORAL_TABLET | Freq: Two times a day (BID) | ORAL | Status: DC | PRN

## 2013-01-10 MED ORDER — POLYSACCHARIDE IRON COMPLEX 150 MG PO CAPS: 150.0000 mg | ORAL_CAPSULE | Freq: Every evening | ORAL | Status: DC

## 2013-01-10 NOTE — Telephone Encounter (Signed)
Last seen 01/18/12  DFS

## 2013-01-16 ENCOUNTER — Telehealth: Payer: Self-pay | Admitting: Family Medicine

## 2013-01-16 MED ORDER — HEMOCYTE PLUS 106-1 MG PO CAPS: 1.0000 | ORAL_CAPSULE | Freq: Every day | ORAL | Status: DC

## 2013-01-16 NOTE — Telephone Encounter (Signed)
Sent in different rx for iron to see if insurance would cover

## 2013-01-31 ENCOUNTER — Other Ambulatory Visit: Payer: Self-pay | Admitting: Oncology

## 2013-02-01 ENCOUNTER — Other Ambulatory Visit: Payer: Self-pay | Admitting: *Deleted

## 2013-02-01 DIAGNOSIS — C569 Malignant neoplasm of unspecified ovary: Secondary | ICD-10-CM

## 2013-02-01 MED ORDER — LORAZEPAM 0.5 MG PO TABS: 0.5000 mg | ORAL_TABLET | Freq: Four times a day (QID) | ORAL | Status: DC | PRN

## 2013-02-01 NOTE — Telephone Encounter (Signed)
Refill on Ativan OK per Warner Mccreedy, NP

## 2013-02-10 ENCOUNTER — Telehealth: Payer: Self-pay | Admitting: Hematology and Oncology

## 2013-02-10 NOTE — Telephone Encounter (Signed)
lmonvm for pt confirming f/u appt for 8/22 @ 9:30am. Also confirmed appts for 8/7 and 8/11. Pt to get new schedule 8/7.

## 2013-02-16 ENCOUNTER — Other Ambulatory Visit (HOSPITAL_BASED_OUTPATIENT_CLINIC_OR_DEPARTMENT_OTHER): Payer: Medicare Other | Admitting: Lab

## 2013-02-16 ENCOUNTER — Ambulatory Visit: Payer: Medicare Other

## 2013-02-16 ENCOUNTER — Ambulatory Visit: Payer: Medicare Other | Admitting: Gynecologic Oncology

## 2013-02-16 VITALS — BP 148/82 | HR 88 | Temp 97.6°F

## 2013-02-16 DIAGNOSIS — C569 Malignant neoplasm of unspecified ovary: Secondary | ICD-10-CM

## 2013-02-16 DIAGNOSIS — C182 Malignant neoplasm of ascending colon: Secondary | ICD-10-CM

## 2013-02-16 DIAGNOSIS — C562 Malignant neoplasm of left ovary: Secondary | ICD-10-CM

## 2013-02-16 LAB — CBC WITH DIFFERENTIAL/PLATELET
Eosinophils Absolute: 0.1 10*3/uL (ref 0.0–0.5)
HGB: 12.9 g/dL (ref 11.6–15.9)
MCV: 97.4 fL (ref 79.5–101.0)
MONO%: 6.1 % (ref 0.0–14.0)
NEUT#: 3.9 10*3/uL (ref 1.5–6.5)
RBC: 3.9 10*6/uL (ref 3.70–5.45)
RDW: 12.8 % (ref 11.2–14.5)
WBC: 5.8 10*3/uL (ref 3.9–10.3)
lymph#: 1.4 10*3/uL (ref 0.9–3.3)

## 2013-02-16 LAB — COMPREHENSIVE METABOLIC PANEL (CC13)
ALT: 18 U/L (ref 0–55)
AST: 14 U/L (ref 5–34)
Calcium: 9.7 mg/dL (ref 8.4–10.4)
Chloride: 105 mEq/L (ref 98–109)
Creatinine: 0.9 mg/dL (ref 0.6–1.1)
Sodium: 140 mEq/L (ref 136–145)
Total Bilirubin: 0.24 mg/dL (ref 0.20–1.20)
Total Protein: 6.6 g/dL (ref 6.4–8.3)

## 2013-02-16 MED ORDER — SODIUM CHLORIDE 0.9 % IJ SOLN
10.0000 mL | INTRAMUSCULAR | Status: DC | PRN
  Administered 2013-02-16: 10 mL via INTRAVENOUS
  Filled 2013-02-16: qty 10

## 2013-02-16 MED ORDER — HEPARIN SOD (PORK) LOCK FLUSH 100 UNIT/ML IV SOLN
500.0000 [IU] | Freq: Once | INTRAVENOUS | Status: AC
  Administered 2013-02-16: 500 [IU] via INTRAVENOUS
  Filled 2013-02-16: qty 5

## 2013-02-17 LAB — CA 125: CA 125: 10.1 U/mL (ref 0.0–30.2)

## 2013-02-17 LAB — CEA: CEA: 1.2 ng/mL (ref 0.0–5.0)

## 2013-02-20 ENCOUNTER — Ambulatory Visit (HOSPITAL_BASED_OUTPATIENT_CLINIC_OR_DEPARTMENT_OTHER): Payer: Medicare Other | Admitting: Genetic Counselor

## 2013-02-20 ENCOUNTER — Encounter: Payer: Self-pay | Admitting: Genetic Counselor

## 2013-02-20 ENCOUNTER — Other Ambulatory Visit: Payer: Medicare Other | Admitting: Lab

## 2013-02-20 DIAGNOSIS — Z8051 Family history of malignant neoplasm of kidney: Secondary | ICD-10-CM

## 2013-02-20 DIAGNOSIS — C182 Malignant neoplasm of ascending colon: Secondary | ICD-10-CM

## 2013-02-20 DIAGNOSIS — Z802 Family history of malignant neoplasm of other respiratory and intrathoracic organs: Secondary | ICD-10-CM

## 2013-02-20 DIAGNOSIS — Z8049 Family history of malignant neoplasm of other genital organs: Secondary | ICD-10-CM

## 2013-02-20 DIAGNOSIS — Z807 Family history of other malignant neoplasms of lymphoid, hematopoietic and related tissues: Secondary | ICD-10-CM

## 2013-02-20 DIAGNOSIS — C569 Malignant neoplasm of unspecified ovary: Secondary | ICD-10-CM

## 2013-02-20 NOTE — Progress Notes (Signed)
Dr.  Laurette Schimke requested a consultation for genetic counseling and risk assessment for Ann Baldwin, a 67 y.o. female, for discussion of her ovarian cancer and family history of prostate, lung, uterine, and kidney cancer and Hodgkin's lymphoma.  She presents to clinic today to discuss the possibility of a genetic predisposition to cancer, and to further clarify her risks, as well as her family members' risks for cancer.   HISTORY OF PRESENT ILLNESS: In 2013, at the age of 15, Ann Baldwin was diagnosed with both ovarian cancer and colon cancer.  It is thought that the colon cancer is the result of her ovarian cancer.  She has had a colonoscopy in the past that was negative.   Ms. [name] was seen with five family members including a sister, granddaughter, niece and two great nieces.   Past Medical History  Diagnosis Date  . Diabetes mellitus   . Hyperlipidemia   . Hypertension     no current meds for since aug 2013  . Anemia   . Ovarian cancer 2013  . Colon cancer 2013    spread from her ovarian cancer    Past Surgical History  Procedure Laterality Date  . No past surgeries    . Laparotomy  07/26/2012    Procedure: EXPLORATORY LAPAROTOMY;  Surgeon: Laurette Schimke, MD PHD;  Location: [facility];  Service: Gynecology;  Laterality: N/A;  Exploratory Laporatomy/Total Abdominal Hysterectomy Bilatderal Salpingo Oopherectomy/Tumor Debulking  . Abdominal hysterectomy  07/26/2012    Procedure: HYSTERECTOMY ABDOMINAL;  Surgeon: Laurette Schimke, MD PHD;  Location: [facility];  Service: Gynecology;;  . Salpingoophorectomy  07/26/2012    Procedure: SALPINGO OOPHORECTOMY;  Surgeon: Laurette Schimke, MD PHD;  Location: [facility];  Service: Gynecology;  Laterality: Bilateral;  . Omentectomy  07/26/2012    Procedure: OMENTECTOMY;  Surgeon: Laurette Schimke, MD PHD;  Location: [facility];  Service: Gynecology;;  . Colostomy revision  07/26/2012    Procedure: COLON RESECTION RIGHT;  Surgeon: Laurette Schimke, MD  PHD;  Location: [facility];  Service: Gynecology;;  with reanastomsis    History   Social History  . Marital Status: Single    Spouse Name: N/A    Number of Children: 2  . Years of Education: N/A   Occupational History  . furniture [employer]    Social History Main Topics  . Smoking status: Never Smoker   . Smokeless tobacco: Never Used  . Alcohol Use: No  . Drug Use: No  . Sexually Active: No   Other Topics Concern  . None   Social History Narrative  . None    REPRODUCTIVE HISTORY AND PERSONAL RISK ASSESSMENT FACTORS: Menarche was at age 44.   postmenopausal Uterus Intact: no Ovaries Intact: no G2P2A0, first live birth at age 26  She has not previously undergone treatment for infertility.   Oral Contraceptive use: 0 years   She has not used HRT in the past.    FAMILY HISTORY:  We obtained a detailed, 4-generation family history.  Significant diagnoses are listed below: Family History  Problem Relation Age of Onset  . Colon cancer Neg Hx   . Esophageal cancer Neg Hx   . Stomach cancer Neg Hx   . Rectal cancer Neg Hx   . Kidney cancer Maternal Uncle   . Uterine cancer Sister 88  . Prostate cancer Cousin     early 39s  . Lymphoma Cousin     Hodgkin's Lymphoma  . Lung cancer Cousin   The patient's  great niece was also diagnosed with hodgkin's lymphoma at age 69.  Patient's ancestors are of [place] descent. There is no reported Ashkenazi Jewish ancestry. There is known consanguinity.  The patient's parents were third cousins.  GENETIC COUNSELING ASSESSMENT: Ann Baldwin is a 67 y.o. female with a personal history of ovarian cancer and family history of Hodgkins Lymphoma and prostate, uterine, lung and kidney cancer which somewhat suggestive of a hereditary cancer syndrome and predisposition to cancer. We, therefore, discussed and recommended the following at today's visit.   DISCUSSION: We reviewed the characteristics, features and inheritance patterns of  hereditary cancer syndromes. We also discussed genetic testing, including the appropriate family members to test, the process of testing, insurance coverage and turn-around-time for results. Approximately 12-15% of ovarian cancer is the result of a hereditary cancer syndrome.  Most commonly it is associated with BRCA mutations, but can also be seen in Lynch syndrome as a result of a MMR gene.  There is not a significant history on either side of her family to lean toward one syndrome or another, other than a sister with cancer of the uterine lining in her 8s.  We recommended Ann Baldwin pursue genetic testing for Breast/ovarian cancer at [facility].   PLAN: After considering the risks, benefits, and limitations, Ann CHIM provided informed consent to pursue genetic testing and the blood sample will be sent to [facility] for analysis of the hereditary genes associated with ovarian cancer. We discussed the implications of a positive, negative and/ or variant of uncertain significance genetic test result. Results should be available within approximately 3-4 weeks' time, at which point they will be disclosed by telephone to Ann Baldwin, as will any additional her recommendations warranted by these results. [name] signed a consent form to release her test results to other family members should she not be available.  Ann Baldwin will receive a summary of her genetic counseling visit and a copy of her results once available. This information will also be available in Epic. We encouraged Ann Baldwin to remain in contact with cancer genetics annually so that we can continuously update the family history and inform her of any changes in cancer genetics and testing that may be of benefit for her family. Ann Baldwin's questions were answered to her satisfaction today. Our contact information was provided should additional questions or concerns arise.   Per the patient's  request, we will contact her by telephone to discuss these results. A follow up genetic counseling visit will be scheduled if indicated.  The patient was seen for a total of 60 minutes, greater than 50% of which was spent face-to-face counseling.  This plan is being carried out per Dr. Norberta Keens recommendations.  This note will also be sent to the referring provider via the electronic medical record. The patient will be supplied with a summary of this genetic counseling discussion as well as educational information on the discussed hereditary cancer syndromes following the conclusion of their visit.   Patient was discussed with Dr. Drue Second.   _______________________________________________________________________ For Office Staff:  Number of people involved in session: 6 Was an Intern/ student involved with case: no

## 2013-03-03 ENCOUNTER — Ambulatory Visit: Payer: Medicare Other

## 2013-03-03 ENCOUNTER — Encounter: Payer: Self-pay | Admitting: Genetic Counselor

## 2013-03-03 ENCOUNTER — Telehealth: Payer: Self-pay | Admitting: Genetic Counselor

## 2013-03-03 NOTE — Telephone Encounter (Signed)
Left message on VM with good news.

## 2013-03-03 NOTE — Telephone Encounter (Signed)
Revealed negative genetic test reusults.

## 2013-03-07 ENCOUNTER — Ambulatory Visit (HOSPITAL_BASED_OUTPATIENT_CLINIC_OR_DEPARTMENT_OTHER): Payer: Medicare Other | Admitting: Hematology and Oncology

## 2013-03-07 VITALS — BP 139/77 | HR 92 | Temp 98.0°F | Resp 20 | Ht 66.0 in | Wt 162.3 lb

## 2013-03-07 DIAGNOSIS — C182 Malignant neoplasm of ascending colon: Secondary | ICD-10-CM

## 2013-03-07 DIAGNOSIS — C569 Malignant neoplasm of unspecified ovary: Secondary | ICD-10-CM

## 2013-03-09 ENCOUNTER — Telehealth: Payer: Self-pay | Admitting: Hematology and Oncology

## 2013-03-09 NOTE — Telephone Encounter (Signed)
s.w. pt and advised on Sept and Dec appts...pt ok and aware

## 2013-03-10 ENCOUNTER — Telehealth: Payer: Self-pay | Admitting: Hematology and Oncology

## 2013-03-10 NOTE — Progress Notes (Signed)
ID: [name] OB: 1946-01-09  MR#: [id number]  WUJ#:[id number]  [facility]  Telephone:(336) [phone] Fax:(336) [phone]   OFFICE PROGRESS NOTE  PCP: LONG, Eustace Moore, MD GYN:   SU:  OTHER MD: DIAGNOSIS:   PAST THERAPY:   CURRENT THERAPY:   HISTORY OF PRESENT ILLNESS: Patient is a 67 y.o. Female who initially presented with GI symptoms in April 2013. She had colonoscopy 01/26/12 when adenocarcinoma consistent with noncolonic primary was found . CT AP on 02/02/12 showed complex solid and cystic left adnexal mass, right ovarian mass, omental caking and peritoneal carcinomatosis.The patient received  has received 6 cycles of dose dense Taxol/carboplatin neoadjuvant chemotherapy which was finished on 06/23/2012.In July 26 2012 she underwent total abdominal hysterectomy bilateral salpingo-oophorectomy omentectomy resection of the midportion of the ascending colon with ileotransverse enterocolostomy. 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 She was treated with 2 additional cycles of weekly taxol completed 11/02/12. For her T4aN1a (1 of 2 nodes was positive) adenocarcinoma of ascending colon, with no microsatellite instability in this specimen no additional treatment was done.  INTERVAL HISTORY: The patient presented today for her follow up visit.Her appetite is good and weight is stable. Occasionally patient has dyspnea. Sometimes she has diarrhea. The patient denied fever, chills, night sweats, change in appetite or weight. She denied headaches, double vision, blurry vision, nasal congestion, nasal discharge, hearing problems, odynophagia or dysphagia. No chest pain, palpitations,  cough, abdominal pain, nausea, vomiting, constipation,  hematochezia. The patient denied dysuria, nocturia, polyuria, hematuria, myalgia, numbness, tingling, psychiatric problems.  PAST MEDICAL HISTORY: Past Medical History  Diagnosis Date  . Diabetes mellitus   . Hyperlipidemia   . Hypertension     no current meds for since aug 2013  . Anemia   . Ovarian cancer 2013  . Colon cancer 2013    spread from her ovarian cancer    PAST SURGICAL HISTORY: Past Surgical History  Procedure Laterality Date  . No past surgeries    . Laparotomy  07/26/2012    Procedure: EXPLORATORY LAPAROTOMY;  Surgeon: Laurette Schimke, MD PHD;  Location: [facility];  Service: Gynecology;  Laterality: N/A;  Exploratory Laporatomy/Total Abdominal Hysterectomy Bilatderal Salpingo Oopherectomy/Tumor Debulking  . Abdominal hysterectomy  07/26/2012    Procedure: HYSTERECTOMY ABDOMINAL;  Surgeon: Laurette Schimke, MD PHD;  Location: [facility];  Service: Gynecology;;  . Salpingoophorectomy  07/26/2012    Procedure: SALPINGO OOPHORECTOMY;  Surgeon: Laurette Schimke, MD PHD;  Location: [facility];  Service: Gynecology;  Laterality: Bilateral;  . Omentectomy  07/26/2012    Procedure: OMENTECTOMY;  Surgeon: Laurette Schimke, MD PHD;  Location: [facility];  Service: Gynecology;;  . Colostomy revision  07/26/2012    Procedure: COLON RESECTION RIGHT;  Surgeon: Laurette Schimke, MD PHD;  Location: [facility];  Service: Gynecology;;  with reanastomsis    FAMILY HISTORY Family History  Problem Relation Age of Onset  . Colon cancer Neg Hx   . Esophageal cancer Neg Hx   . Stomach cancer Neg Hx   . Rectal cancer Neg Hx   . Kidney cancer Maternal Uncle   . Uterine cancer Sister 40  . Prostate cancer Cousin     early 77s  . Lymphoma Cousin     Hodgkin's Lymphoma  . Lung cancer [employer]  MAINTENANCE: History  Substance Use Topics  . Smoking status: Never Smoker   . Smokeless tobacco: Never Used  . Alcohol Use: No   No Known Allergies  Current Outpatient Prescriptions  Medication Sig Dispense  Refill  . aspirin 81 MG tablet Take 81 mg by mouth every morning.       Marland Kitchen atorvastatin (LIPITOR) 40 MG tablet Take 40 mg by mouth at bedtime.       . ciprofloxacin (CIPRO) 500 MG tablet Take 1 tablet by mouth 2 (two) times daily.      Marland Kitchen docusate sodium (COLACE) 100 MG capsule Take 100 mg by mouth 2 (two) times daily.       Marland Kitchen ezetimibe (ZETIA) 10 MG tablet Take 10 mg by mouth every morning.       . Ferrous Fumarate 150 MG TABS Take 150 mg by mouth daily.      Marland Kitchen HYDROcodone-acetaminophen (NORCO/VICODIN) 5-325 MG per tablet Take 1 tablet by mouth 2 (two) times daily as needed for pain (pain).  60 tablet  0  . insulin regular (NOVOLIN R,HUMULIN R) 100 units/mL injection Inject into the skin 3 (three) times daily before meals. Sliding scale only as needed for BS>200      . lidocaine-prilocaine (EMLA) cream Apply to [name] Cath 1- 2 hours prior to access  30 g  1  . LORazepam (ATIVAN) 0.5 MG tablet Take 1 tablet (0.5 mg total) by mouth every 6 (six) hours as needed (nausea or sleep).  30 tablet  1  . ondansetron (ZOFRAN) 8 MG tablet TAKE ONE TABLET BY MOUTH EVERY EIGHT HOURS AS NEEDED FOR NAUSEA  30 tablet  1  . polyethylene glycol (MIRALAX / GLYCOLAX) packet Take 17 g by mouth 2 (two) times daily as needed. constipation       No current facility-administered medications for this visit.   Facility-Administered Medications Ordered in Other Visits  Medication Dose Route Frequency Provider Last Rate Last Dose  . sodium chloride 0.9 % injection 10 mL  10 mL Intracatheter PRN Lennis Buzzy Han, MD        OBJECTIVE: Filed Vitals:   03/07/13 1324  BP: 139/77  Pulse: 92  Temp: 98 F (36.7 C)  Resp: 20     Body mass index is 26.21 kg/(m^2).    ECOG FS:  PHYSICAL EXAMINATION:  HEENT: Sclerae anicteric.  Conjunctivae were pink. Pupils round and reactive bilaterally. Oral mucosa is moist without ulceration or thrush. No occipital, submandibular, cervical, supraclavicular or axillar adenopathy. Lungs:  clear to auscultation without wheezes. No rales or rhonchi. Heart: regular rate and rhythm. No murmur, gallop or rubs. Abdomen: soft, non tender. No guarding or rebound tenderness. Bowel sounds are present. No palpable hepatosplenomegaly. MSK: no focal spinal tenderness. Extremities: No clubbing or cyanosis.No calf tenderness to palpitation, no peripheral edema. The patient had grossly intact strength in upper and lower extremities. Skin exam was without ecchymosis, petechiae. Neuro: non-focal, alert and oriented to time, person and place, appropriate affect  LAB RESULTS:  CMP     Component Value Date/Time   NA 140 02/16/2013 1338   NA 132* 11/02/2012 1413   K 4.3 02/16/2013 1338   K 4.1 11/02/2012 1413   CL 106 12/27/2012 1305   CL 94* 11/02/2012 1413   CO2 25 02/16/2013 1338   CO2 22 11/02/2012 1413   GLUCOSE 246* 02/16/2013 1338   GLUCOSE 159* 12/27/2012 1305   GLUCOSE 486* 11/02/2012 1413   BUN 14.0 02/16/2013 1338   BUN  19 11/02/2012 1413   CREATININE 0.9 02/16/2013 1338   CREATININE 0.76 11/02/2012 1413   CALCIUM 9.7 02/16/2013 1338   CALCIUM 9.5 11/02/2012 1413   PROT 6.6 02/16/2013 1338   PROT 6.4 11/02/2012 1413   ALBUMIN 3.6 02/16/2013 1338   ALBUMIN 2.8* 11/02/2012 1413   AST 14 02/16/2013 1338   AST 8 11/02/2012 1413   ALT 18 02/16/2013 1338   ALT 12 11/02/2012 1413   ALKPHOS 86 02/16/2013 1338   ALKPHOS 84 11/02/2012 1413   BILITOT 0.24 02/16/2013 1338   BILITOT 0.1* 11/02/2012 1413   GFRNONAA >90 09/29/2012 0412   GFRAA >90 09/29/2012 0412   Lab Results  Component Value Date   WBC 5.8 02/16/2013   NEUTROABS 3.9 02/16/2013   HGB 12.9 02/16/2013   HCT 38.0 02/16/2013   MCV 97.4 02/16/2013   PLT 206 02/16/2013      Chemistry      Component Value Date/Time   NA 140 02/16/2013 1338   NA 132* 11/02/2012 1413   K 4.3 02/16/2013 1338   K 4.1 11/02/2012 1413   CL 106 12/27/2012 1305   CL 94* 11/02/2012 1413   CO2 25 02/16/2013 1338   CO2 22 11/02/2012 1413   BUN 14.0 02/16/2013 1338   BUN 19 11/02/2012 1413    CREATININE 0.9 02/16/2013 1338   CREATININE 0.76 11/02/2012 1413   GLU 374* 11/02/2012 1625      Component Value Date/Time   CALCIUM 9.7 02/16/2013 1338   CALCIUM 9.5 11/02/2012 1413   ALKPHOS 86 02/16/2013 1338   ALKPHOS 84 11/02/2012 1413   AST 14 02/16/2013 1338   AST 8 11/02/2012 1413   ALT 18 02/16/2013 1338   ALT 12 11/02/2012 1413   BILITOT 0.24 02/16/2013 1338   BILITOT 0.1* 11/02/2012 1413      STUDIES: No results found.  ASSESSMENT AND PLAN: 1. 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 Last CT AP was 09/23/12. Last CA 125 on 02/16/13 was  10.1 2. History of T4aN1a adenocarcinoma of ascending colon: post resection 07/26/2012 with involvement of 1 of 2 regional nodes, no microsatellite instability in this specimen. To follow with close observation rather than try adjuvant chemotherapy. She needs to be seen by medical oncology for this follow up every 3 - 6  months, with CEA's. Last CEA 1.2 on 02/16/13.  CT of chest/abdominal/pelvic in 1 year.. Colonoscopy in 1 year annually 3.PAC needs flushes every 6-8 weeks  4.Anemia: Resolved.  5. Follow up in 3-4 months with CEA and CA 125. Continue follow up with Dr. Nelly Rout.   Myra Rude, MD   03/07/2013 11:14 PM

## 2013-03-10 NOTE — Telephone Encounter (Signed)
Returned pt's call re flush appt. Per pt request added flush appt to 9/25 lb appt. Pt aware new time for lb/flush appt 9/25 is 1:30pm.

## 2013-03-14 ENCOUNTER — Telehealth: Payer: Self-pay | Admitting: Hematology and Oncology

## 2013-03-16 ENCOUNTER — Other Ambulatory Visit: Payer: Self-pay | Admitting: *Deleted

## 2013-03-16 ENCOUNTER — Ambulatory Visit (HOSPITAL_COMMUNITY)
Admission: RE | Admit: 2013-03-16 | Discharge: 2013-03-16 | Disposition: A | Discharge status: Home / Self Care | Payer: Medicare Other | Source: Ambulatory Visit | Attending: Hematology and Oncology | Admitting: Hematology and Oncology

## 2013-03-16 ENCOUNTER — Ambulatory Visit (HOSPITAL_BASED_OUTPATIENT_CLINIC_OR_DEPARTMENT_OTHER): Payer: Medicare Other | Admitting: Lab

## 2013-03-16 DIAGNOSIS — C182 Malignant neoplasm of ascending colon: Secondary | ICD-10-CM

## 2013-03-16 DIAGNOSIS — C569 Malignant neoplasm of unspecified ovary: Secondary | ICD-10-CM | POA: Insufficient documentation

## 2013-03-16 DIAGNOSIS — Z9049 Acquired absence of other specified parts of digestive tract: Secondary | ICD-10-CM | POA: Insufficient documentation

## 2013-03-16 DIAGNOSIS — I2510 Atherosclerotic heart disease of native coronary artery without angina pectoris: Secondary | ICD-10-CM | POA: Insufficient documentation

## 2013-03-16 DIAGNOSIS — K439 Ventral hernia without obstruction or gangrene: Secondary | ICD-10-CM | POA: Insufficient documentation

## 2013-03-16 DIAGNOSIS — C189 Malignant neoplasm of colon, unspecified: Secondary | ICD-10-CM | POA: Insufficient documentation

## 2013-03-16 LAB — CBC WITH DIFFERENTIAL/PLATELET
BASO%: 0.5 % (ref 0.0–2.0)
HCT: 40 % (ref 34.8–46.6)
LYMPH%: 25.7 % (ref 14.0–49.7)
MCHC: 34.3 g/dL (ref 31.5–36.0)
MCV: 96.2 fL (ref 79.5–101.0)
MONO#: 0.5 10*3/uL (ref 0.1–0.9)
MONO%: 8.5 % (ref 0.0–14.0)
NEUT%: 64 % (ref 38.4–76.8)
Platelets: 220 10*3/uL (ref 145–400)
RBC: 4.16 10*6/uL (ref 3.70–5.45)
WBC: 5.6 10*3/uL (ref 3.9–10.3)

## 2013-03-16 MED ORDER — IOHEXOL 300 MG/ML  SOLN
50.0000 mL | Freq: Once | INTRAMUSCULAR | Status: AC | PRN
  Administered 2013-03-16: 50 mL via ORAL

## 2013-03-16 MED ORDER — IOHEXOL 300 MG/ML  SOLN
100.0000 mL | Freq: Once | INTRAMUSCULAR | Status: AC | PRN
  Administered 2013-03-16: 100 mL via INTRAVENOUS

## 2013-03-21 ENCOUNTER — Other Ambulatory Visit: Payer: Self-pay | Admitting: Oncology

## 2013-03-27 ENCOUNTER — Telehealth: Payer: Self-pay | Admitting: Gynecologic Oncology

## 2013-03-27 NOTE — Telephone Encounter (Signed)
Spoke with patient regarding CT findings and suggested admission at [facility] to further evaluate. Patiet feels well, no fever, chills, nausea, vominting, abdominal pain or discomfort.  Declines admission unless she is symptomatic.  Will see Ms Ekstein in clinic 03/30/2013.

## 2013-03-28 ENCOUNTER — Other Ambulatory Visit: Payer: Self-pay | Admitting: Gynecologic Oncology

## 2013-03-28 DIAGNOSIS — C569 Malignant neoplasm of unspecified ovary: Secondary | ICD-10-CM

## 2013-03-30 ENCOUNTER — Ambulatory Visit: Payer: Medicare Other | Attending: Gynecologic Oncology | Admitting: Gynecologic Oncology

## 2013-03-30 ENCOUNTER — Encounter: Payer: Self-pay | Admitting: Gynecologic Oncology

## 2013-03-30 ENCOUNTER — Other Ambulatory Visit (HOSPITAL_BASED_OUTPATIENT_CLINIC_OR_DEPARTMENT_OTHER): Payer: Medicare Other | Admitting: Lab

## 2013-03-30 VITALS — BP 144/77 | HR 92 | Temp 98.0°F | Resp 16 | Ht 66.0 in | Wt 162.6 lb

## 2013-03-30 DIAGNOSIS — C182 Malignant neoplasm of ascending colon: Secondary | ICD-10-CM | POA: Insufficient documentation

## 2013-03-30 DIAGNOSIS — Z90710 Acquired absence of both cervix and uterus: Secondary | ICD-10-CM | POA: Insufficient documentation

## 2013-03-30 DIAGNOSIS — C189 Malignant neoplasm of colon, unspecified: Secondary | ICD-10-CM

## 2013-03-30 DIAGNOSIS — N8111 Cystocele, midline: Secondary | ICD-10-CM | POA: Insufficient documentation

## 2013-03-30 DIAGNOSIS — C569 Malignant neoplasm of unspecified ovary: Secondary | ICD-10-CM

## 2013-03-30 DIAGNOSIS — Z9221 Personal history of antineoplastic chemotherapy: Secondary | ICD-10-CM | POA: Insufficient documentation

## 2013-03-30 DIAGNOSIS — E119 Type 2 diabetes mellitus without complications: Secondary | ICD-10-CM | POA: Insufficient documentation

## 2013-03-30 DIAGNOSIS — N816 Rectocele: Secondary | ICD-10-CM | POA: Insufficient documentation

## 2013-03-30 LAB — CBC WITH DIFFERENTIAL/PLATELET
BASO%: 0.3 % (ref 0.0–2.0)
EOS%: 1.1 % (ref 0.0–7.0)
HCT: 39.7 % (ref 34.8–46.6)
LYMPH%: 20.8 % (ref 14.0–49.7)
MCH: 33.1 pg (ref 25.1–34.0)
MCHC: 34.4 g/dL (ref 31.5–36.0)
NEUT%: 70.4 % (ref 38.4–76.8)
Platelets: 263 10*3/uL (ref 145–400)
RBC: 4.13 10*6/uL (ref 3.70–5.45)
WBC: 6.4 10*3/uL (ref 3.9–10.3)

## 2013-03-30 MED ORDER — POLYSACCHARIDE IRON COMPLEX 150 MG PO CAPS: 150.0000 mg | ORAL_CAPSULE | Freq: Every evening | ORAL | Status: DC

## 2013-03-30 NOTE — Patient Instructions (Signed)
F/U in 3 months   Thank you very much [name] for allowing me to provide care for you today.  I appreciate your confidence in choosing our Gynecologic Oncology team.  If you have any questions about your visit today please call our office and we will get back to you as soon as possible.  Maryclare Labrador. Rivky Clendenning MD., PhD Gynecologic Oncology  Intestinal Obstruction An intestinal obstruction comes from a physical blockage in the bowel. Different problems in the bowel may cause these blockages.  CAUSES   Adhesions from previous surgeries  Cancer or tumor  A hernia, which is a condition in which a portion of the bowel bulges out through an opening or weakness in the abdomen (belly). This sometimes squeezes the bowel.  A swallowed object.  Blockage (impaction) with worms is common in third world countries.  A twisting of the bowel or telescoping of a portion of the bowel into another portion (intussusceptions)  Anything that stops food from going through from the stomach to the anus. SYMPTOMS  Symptoms of bowel obstruction may result in your belly getting bigger (bloating), nausea, vomiting, explosive diarrhea, explosive stool. You may not be able to hear your normal bowel sounds (such as "growling in your stomach"). You may also stop having bowel movements or passing gas. DIAGNOSIS  Usually this condition is diagnosed with a history and an examination. Often, lab studies (blood work) and x-rays may be used to find the cause. TREATMENT  The main treatment of this condition is to rest the intestine (gut). Often times the obstruction may relieve itself and allow the gut to start working again. Think of the gut like a balloon that is blown up (filled with trapped food and water) which has squeezed into a hole or area which it cannot get through.   If the obstruction is complete, an NG tube (nasogastric) is passed through the nose and into the stomach. It is then connected to suction to  keep the stomach emptied out. This also helps treat the nausea and vomiting.  If there is an imbalance in the electrolytes, they are corrected with intravenous fluids. These have the proper chemicals in them to correct the problem.  If the reason for the obstruction (blockage) does not get better with conservative (non-surgical) treatment, surgery may be necessary. Sometimes, surgery is done immediately if your surgeon knows that the problem is not going to get better with conservative treatment. PROGNOSIS  Depending on what the problem is, most of these problems can be treated by your caregivers with good results. Your surgeon will discuss the best course of action to take, with you or your loved ones. FOLLOWING SURGERY Seek immediate medical attention if you have:  Increasing abdominal pain, repeated vomiting, dehydration or fainting.  Severe weakness, chest pain or back pain.  Blood in your vomit, stool or you have tarry stool. Document Released: 09/19/2003 Document Revised: 09/21/2011 Document Reviewed: 02/17/2008 [facility] Patient Information 2014 [place], [place].

## 2013-03-30 NOTE — Progress Notes (Signed)
Office Visit Note: Gyn-Onc    Ann Baldwin 67 y.o. female  CC:  Synchronous ovarian/colon cancer.  Assessment/Plan:  [name]  is a 67 y.o.  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  She underwent interval debulking 07/26/2012  and findings were consistent with a synchronous ovarian and ascending colon malignancy. Postoperative course was complicated by either bowel obstruction caused by an internal hernia or ischemia that spontaneously resolved.  The patient completed chemotherapy 12/2011.  Imaging study for surveillance??? Notable for possible abscess, anastomotic leak, recurrence.   Patient is well and asymptomattic.  Afebrile normal WBC. Will check Ca125 and CEA. Will assess if the area of suspicion can be biopsied.  Counseled on the signs and symptoms of intestinal obstruction.    F/U in 3 months.  Will consider repeat CT scan.  HPI: 67 y.o.  y/o G2P2 LNMP 17's  presented in April 2013 with abdominal discomfort, diarrhea, nausea, no constipation  that was thought to be IBD. The symptoms persisted and she was referred for colonoscopy 01/26/2012.  Findings were notable for  A large ulcer with heaped up margins in the ascending colon.  It was difficult to see and to obtain biopsies.    Pathology was c/w adenocarcinoma and the concern that it was not a primary colonic adenocarcinoma.    CT abdomen and pelvis 02/01/2012 with a 5x6.2 cmcomplex solid and cystic left adnexal mass, 3.9x2.9 right ovarian soft tissue mass, omental caking and peritoneal carcinomatosis.    The initial CA 125 returned > 22,000 CEA wnl, immuno stains of the biopsy were not c/w colon cancer.  Cr was > 8 and the patient was admitted with acute renal failure. Cytology from paracentesis was c/w Gyn primary. She has received 6 cycles of dose dense Taxol/carboplatin. Cycle 6was  administered on 06/23/2012. CA 125 34.7 06/2012  A CT scan of the abdomen and pelvis collected on 05/12/2012 IMPRESSION:  1. Interval response to therapy. There has been near complete resolution of abdominal ascites and peritoneal nodularity. 2. Interval decrease in the size of bilateral adnexal cystic lesions. 3. Small soft tissue attenuating nodule arising from the uterine fundus is unchanged in size from previous exam and is favored to represent a subserosal (exophytic) fibroid. 4. No new or progressive disease identified. 5. Small subpleural nodule in the right middle lobe is stable from previous exam.   In July 26 2012 she underwent total abdominal hysterectomy bilateral salpingo-oophorectomy omentectomy resection of the midportion of the ascending colon with ileotransverse enterocolostomy. 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  Postoperatively she was diagnosed with pneumonia and a UTI.   Earlier in March she was admitted with abdominal pain. The differential diagnosis as well as ischemia versus obstruction. This process resolved with conservative management. Patient states that she feels well.  She reports residual fatigue.  Molecular evaluation of the colon cancer IHC: No loss of expression of MSH2 and MSH6, loss expression of PMS2 and MLH1. ([id number]) MSI PCR: Microsatellite instability-high (BAT-26, NR-21, BAT-25, MONO-27 and NR-24: unstable; [id number]).  Patient has been doing well.  A CT was ordered by a physician covering for Dr. Darrold Span and was notable: " At the level of  the small bowel/right colon anastomoses, there is thickening of the walls and mild infiltration surrounding fat planes with a 3.2 x 2.5 x 2.2 cm loculated fluid collection. This  fluid is in a different position than fluid noted on prior examination. Etiology indeterminate. As this is immediately adjacent to the anastomoses, this may represent a leak from the anastomoses and small contained abscess. Ovarian metastatic disease felt to be less likely consideration. Small adjacent lymph nodes  Ann Baldwin feels better than ever.  Great appetite, no bloating no n/v no fever, chills, no abdominal pain. Lab Results  Component Value Date   WBC 6.4 03/30/2013   HGB 13.7 03/30/2013   HCT 39.7 03/30/2013   MCV 96.1 03/30/2013   PLT 263 03/30/2013     Past Surgical Hx:  Past Surgical History  Procedure Laterality Date  . No past surgeries    . Laparotomy  07/26/2012    Procedure: EXPLORATORY LAPAROTOMY;  Surgeon: Laurette Schimke, MD PHD;  Location: [facility];  Service: Gynecology;  Laterality: N/A;  Exploratory Laporatomy/Total Abdominal Hysterectomy Bilatderal Salpingo Oopherectomy/Tumor Debulking  . Abdominal hysterectomy  07/26/2012    Procedure: HYSTERECTOMY ABDOMINAL;  Surgeon: Laurette Schimke, MD PHD;  Location: [facility];  Service: Gynecology;;  . Salpingoophorectomy  07/26/2012    Procedure: SALPINGO OOPHORECTOMY;  Surgeon: Laurette Schimke, MD PHD;  Location: [facility];  Service: Gynecology;  Laterality: Bilateral;  . Omentectomy  07/26/2012    Procedure: OMENTECTOMY;  Surgeon: Laurette Schimke, MD PHD;  Location: [facility];  Service: Gynecology;;  . Colostomy revision  07/26/2012    Procedure: COLON RESECTION RIGHT;  Surgeon: Laurette Schimke, MD PHD;  Location: [facility];  Service: Gynecology;;  with reanastomsis    Past Medical Hx:  Past Medical History  Diagnosis Date  . Diabetes mellitus   . Hyperlipidemia   . Hypertension     no current meds for since aug 2013  . Anemia   . Ovarian cancer 2013  . Colon cancer 2013    spread from her ovarian cancer  Last mammogram 2 years ago wnl.  Past Gynecological History: G2 P2 LNMP in 40's.  Menarche 12 regular menses until menopause.  No  contraceptives used.  No h/o abnormal pap test, Last pap 10 years ago.  NSVD x 2.  No LMP recorded. Patient has had a hysterectomy.  Family Hx:  Family History  Problem Relation Age of Onset  . Colon cancer Neg Hx   . Esophageal cancer Neg Hx   . Stomach cancer Neg Hx   . Rectal cancer Neg Hx   . Kidney cancer Maternal Uncle   . Uterine cancer Sister 32  . Prostate cancer Cousin     early 47s  . Lymphoma Cousin     Hodgkin's Lymphoma  . Lung cancer Cousin     Review of Systems:  Constitutional  Feels better than she has in years. Cardiovascular  No chest pain, shortness of breath,  Pulmonary  No cough or wheeze.  Gastro Intestinal  No nausea or vomiting, no bloating, no bright red blood per rectum.  No constipation, no diarrhea,  No abdominal pain Genito Urinary  No vaginal bleeding or discharge ID  no fever or chills  Vitals:  Blood pressure 144/77, pulse 92, temperature 98 F (36.7 C), temperature source Oral, resp. rate 16, height 5\' 6"  (1.676 m), weight 162 lb 9.6 oz (73.755 kg).  PHYSICAL EXAMINATION  Neck  Supple without any enlargements.  Cardiovascular  Pulse normal rate, regularity and rhythm.  S1 and S2 normal,   Lungs  Clear to auscultation bilateraly, without wheezes/crackles/rhonchi. Good air movement.  Skin  No rash/lesions/breakdown  Psychiatry  Alert and oriented to person, place, and time  Abdomen  Normoactive bowel sounds, abdomen soft, non-tender and obese. Surgical  sites intact without evidence of hernia. No ascites or palpable masses.  No guarding, no tenderness, no rebound Genitala:  Normal external genitalia Bartholin's urethra and Skene's grade 1 cystocele and grade 1 rectocele evident no palpable pelvic masses or nodularity Rectal Good tone no masses no rectovaginal septum nodularity Extremities  No bilateral cyanosis, clubbing or edema. No rash, lesions or petiche.    Laurette Schimke, MD, PhD 03/30/2013, 1:58 PM

## 2013-04-06 ENCOUNTER — Other Ambulatory Visit (HOSPITAL_BASED_OUTPATIENT_CLINIC_OR_DEPARTMENT_OTHER): Payer: Medicare Other

## 2013-04-06 ENCOUNTER — Ambulatory Visit (HOSPITAL_BASED_OUTPATIENT_CLINIC_OR_DEPARTMENT_OTHER): Payer: Medicare Other

## 2013-04-06 VITALS — BP 153/70 | HR 92 | Temp 98.5°F | Resp 18

## 2013-04-06 DIAGNOSIS — C569 Malignant neoplasm of unspecified ovary: Secondary | ICD-10-CM

## 2013-04-06 DIAGNOSIS — C189 Malignant neoplasm of colon, unspecified: Secondary | ICD-10-CM

## 2013-04-06 DIAGNOSIS — Z452 Encounter for adjustment and management of vascular access device: Secondary | ICD-10-CM

## 2013-04-06 LAB — CBC WITH DIFFERENTIAL/PLATELET
BASO%: 0.7 % (ref 0.0–2.0)
EOS%: 1.4 % (ref 0.0–7.0)
LYMPH%: 19.5 % (ref 14.0–49.7)
MCH: 32.7 pg (ref 25.1–34.0)
MCHC: 34.2 g/dL (ref 31.5–36.0)
MCV: 95.7 fL (ref 79.5–101.0)
MONO%: 7.5 % (ref 0.0–14.0)
Platelets: 248 10*3/uL (ref 145–400)
RBC: 3.9 10*6/uL (ref 3.70–5.45)
RDW: 12.7 % (ref 11.2–14.5)

## 2013-04-06 MED ORDER — SODIUM CHLORIDE 0.9 % IJ SOLN
10.0000 mL | INTRAMUSCULAR | Status: DC | PRN
  Administered 2013-04-06: 10 mL via INTRAVENOUS
  Filled 2013-04-06: qty 10

## 2013-04-06 MED ORDER — HEPARIN SOD (PORK) LOCK FLUSH 100 UNIT/ML IV SOLN
500.0000 [IU] | Freq: Once | INTRAVENOUS | Status: AC
  Administered 2013-04-06: 500 [IU] via INTRAVENOUS
  Filled 2013-04-06: qty 5

## 2013-04-07 LAB — CEA: CEA: 1.7 ng/mL (ref 0.0–5.0)

## 2013-04-10 ENCOUNTER — Other Ambulatory Visit: Payer: Self-pay | Admitting: Physician Assistant

## 2013-04-24 ENCOUNTER — Telehealth: Payer: Self-pay | Admitting: Gynecologic Oncology

## 2013-04-24 NOTE — Telephone Encounter (Signed)
Patient informed of lab results.  No concerns voiced.  Reporting that she is doing well.

## 2013-05-13 ENCOUNTER — Other Ambulatory Visit: Payer: Self-pay | Admitting: Physician Assistant

## 2013-05-16 ENCOUNTER — Other Ambulatory Visit: Payer: Self-pay | Admitting: *Deleted

## 2013-05-16 DIAGNOSIS — C569 Malignant neoplasm of unspecified ovary: Secondary | ICD-10-CM

## 2013-05-16 MED ORDER — LORAZEPAM 0.5 MG PO TABS: 0.5000 mg | ORAL_TABLET | Freq: Four times a day (QID) | ORAL | Status: DC | PRN

## 2013-05-18 ENCOUNTER — Other Ambulatory Visit: Payer: Self-pay | Admitting: Gynecologic Oncology

## 2013-05-18 ENCOUNTER — Encounter (INDEPENDENT_AMBULATORY_CARE_PROVIDER_SITE_OTHER): Payer: Self-pay

## 2013-05-18 ENCOUNTER — Ambulatory Visit (HOSPITAL_BASED_OUTPATIENT_CLINIC_OR_DEPARTMENT_OTHER): Payer: Medicare Other

## 2013-05-18 VITALS — BP 147/78 | HR 98 | Temp 98.5°F | Resp 18

## 2013-05-18 DIAGNOSIS — C569 Malignant neoplasm of unspecified ovary: Secondary | ICD-10-CM

## 2013-05-18 DIAGNOSIS — Z5111 Encounter for antineoplastic chemotherapy: Secondary | ICD-10-CM

## 2013-05-18 DIAGNOSIS — Z95828 Presence of other vascular implants and grafts: Secondary | ICD-10-CM

## 2013-05-18 MED ORDER — SODIUM CHLORIDE 0.9 % IJ SOLN
10.0000 mL | INTRAMUSCULAR | Status: DC | PRN
  Administered 2013-05-18: 10 mL via INTRAVENOUS
  Filled 2013-05-18: qty 10

## 2013-05-18 MED ORDER — HYDROCODONE-ACETAMINOPHEN 5-325 MG PO TABS: 1.0000 | ORAL_TABLET | Freq: Two times a day (BID) | ORAL | Status: DC | PRN

## 2013-05-18 MED ORDER — HEPARIN SOD (PORK) LOCK FLUSH 100 UNIT/ML IV SOLN
500.0000 [IU] | Freq: Once | INTRAVENOUS | Status: AC
  Administered 2013-05-18: 500 [IU] via INTRAVENOUS
  Filled 2013-05-18: qty 5

## 2013-05-18 NOTE — Patient Instructions (Signed)
Implanted Port Instructions  An implanted port is a central line that has a round shape and is placed under the skin. It is used for long-term IV (intravenous) access for:  · Medicine.  · Fluids.  · Liquid nutrition, such as TPN (total parenteral nutrition).  · Blood samples.  Ports can be placed:  · In the chest area just below the collarbone (this is the most common place.)  · In the arms.  · In the belly (abdomen) area.  · In the legs.  PARTS OF THE PORT  A port has 2 main parts:  · The reservoir. The reservoir is round, disc-shaped, and will be a small, raised area under your skin.  · The reservoir is the part where a needle is inserted (accessed) to either give medicines or to draw blood.  · The catheter. The catheter is a long, slender tube that extends from the reservoir. The catheter is placed into a large vein.  · Medicine that is inserted into the reservoir goes into the catheter and then into the vein.  INSERTION OF THE PORT  · The port is surgically placed in either an operating room or in a procedural area (interventional radiology).  · Medicine may be given to help you relax during the procedure.  · The skin where the port will be inserted is numbed (local anesthetic).  · 1 or 2 small cuts (incisions) will be made in the skin to insert the port.  · The port can be used after it has been inserted.  INCISION SITE CARE  · The incision site may have small adhesive strips on it. This helps keep the incision site closed. Sometimes, no adhesive strips are placed. Instead of adhesive strips, a special kind of surgical glue is used to keep the incision closed.  · If adhesive strips were placed on the incision sites, do not take them off. They will fall off on their own.  · The incision site may be sore for 1 to 2 days. Pain medicine can help.  · Do not get the incision site wet. Bathe or shower as directed by your caregiver.  · The incision site should heal in 5 to 7 days. A small scar may form after the  incision has healed.  ACCESSING THE PORT  Special steps must be taken to access the port:  · Before the port is accessed, a numbing cream can be placed on the skin. This helps numb the skin over the port site.  · A sterile technique is used to access the port.  · The port is accessed with a needle. Only "non-coring" port needles should be used to access the port. Once the port is accessed, a blood return should be checked. This helps ensure the port is in the vein and is not clogged (clotted).  · If your caregiver believes your port should remain accessed, a clear (transparent) bandage will be placed over the needle site. The bandage and needle will need to be changed every week or as directed by your caregiver.  · Keep the bandage covering the needle clean and dry. Do not get it wet. Follow your caregiver's instructions on how to take a shower or bath when the port is accessed.  · If your port does not need to stay accessed, no bandage is needed over the port.  FLUSHING THE PORT  Flushing the port keeps it from getting clogged. How often the port is flushed depends on:  · If a   constant infusion is running. If a constant infusion is running, the port may not need to be flushed.  · If intermittent medicines are given.  · If the port is not being used.  For intermittent medicines:  · The port will need to be flushed:  · After medicines have been given.  · After blood has been drawn.  · As part of routine maintenance.  · A port is normally flushed with:  · Normal saline.  · Heparin.  · Follow your caregiver's advice on how often, how much, and the type of flush to use on your port.  IMPORTANT PORT INFORMATION  · Tell your caregiver if you are allergic to heparin.  · After your port is placed, you will get a manufacturer's information card. The card has information about your port. Keep this card with you at all times.  · There are many types of ports available. Know what kind of port you have.  · In case of an  emergency, it may be helpful to wear a medical alert bracelet. This can help alert health care workers that you have a port.  · The port can stay in for as long as your caregiver believes it is necessary.  · When it is time for the port to come out, surgery will be done to remove it. The surgery will be similar to how the port was put in.  · If you are in the hospital or clinic:  · Your port will be taken care of and flushed by a nurse.  · If you are at home:  · A home health care nurse may give medicines and take care of the port.  · You or a family member can get special training and directions for giving medicine and taking care of the port at home.  SEEK IMMEDIATE MEDICAL CARE IF:   · Your port does not flush or you are unable to get a blood return.  · New drainage or pus is coming from the incision.  · A bad smell is coming from the incision site.  · You develop swelling or increased redness at the incision site.  · You develop increased swelling or pain at the port site.  · You develop swelling or pain in the surrounding skin near the port.  · You have an oral temperature above 102° F (38.9° C), not controlled by medicine.  MAKE SURE YOU:   · Understand these instructions.  · Will watch your condition.  · Will get help right away if you are not doing well or get worse.  Document Released: 06/29/2005 Document Revised: 09/21/2011 Document Reviewed: 09/20/2008  [employer]® Patient Information ©2014 [employer].

## 2013-05-18 NOTE — Progress Notes (Signed)
Patient stopped by the office requesting a refill on hydrocodone/APAP 5/325 taking one tablet twice daily as needed for abdominal pain.  She reports the pain as intermittent and sharp.  Appetite good with no nausea or emesis reported.  Bowels and bladder functioning without difficulty.  Given a refill for hydrocodone/APAP 5/325 to be taken one tablet twice daily as needed for pain.  She is advised to take medication only as needed and to take tylenol or ibuprofen first.  She has a follow up appointment in December.  She is advised to call for any questions or concerns.

## 2013-06-14 ENCOUNTER — Other Ambulatory Visit: Payer: Self-pay | Admitting: Physician Assistant

## 2013-06-14 ENCOUNTER — Other Ambulatory Visit: Payer: Self-pay | Admitting: Oncology

## 2013-06-14 DIAGNOSIS — C182 Malignant neoplasm of ascending colon: Secondary | ICD-10-CM

## 2013-06-14 DIAGNOSIS — C569 Malignant neoplasm of unspecified ovary: Secondary | ICD-10-CM

## 2013-06-15 ENCOUNTER — Other Ambulatory Visit: Payer: Self-pay | Admitting: *Deleted

## 2013-06-15 DIAGNOSIS — C569 Malignant neoplasm of unspecified ovary: Secondary | ICD-10-CM

## 2013-06-15 MED ORDER — LORAZEPAM 0.5 MG PO TABS: ORAL_TABLET | ORAL | Status: DC

## 2013-06-27 ENCOUNTER — Encounter (INDEPENDENT_AMBULATORY_CARE_PROVIDER_SITE_OTHER): Payer: Self-pay

## 2013-06-27 ENCOUNTER — Ambulatory Visit: Payer: Medicare Other | Attending: Gynecologic Oncology | Admitting: Gynecologic Oncology

## 2013-06-27 ENCOUNTER — Other Ambulatory Visit: Payer: Self-pay | Admitting: Oncology

## 2013-06-27 ENCOUNTER — Encounter: Payer: Self-pay | Admitting: Gynecologic Oncology

## 2013-06-27 VITALS — BP 126/73 | HR 84 | Temp 98.4°F | Resp 16 | Ht 66.0 in | Wt 162.1 lb

## 2013-06-27 DIAGNOSIS — R52 Pain, unspecified: Secondary | ICD-10-CM | POA: Insufficient documentation

## 2013-06-27 DIAGNOSIS — R5381 Other malaise: Secondary | ICD-10-CM | POA: Insufficient documentation

## 2013-06-27 DIAGNOSIS — C182 Malignant neoplasm of ascending colon: Secondary | ICD-10-CM | POA: Insufficient documentation

## 2013-06-27 DIAGNOSIS — C569 Malignant neoplasm of unspecified ovary: Secondary | ICD-10-CM | POA: Insufficient documentation

## 2013-06-27 DIAGNOSIS — E785 Hyperlipidemia, unspecified: Secondary | ICD-10-CM | POA: Insufficient documentation

## 2013-06-27 DIAGNOSIS — Z9221 Personal history of antineoplastic chemotherapy: Secondary | ICD-10-CM | POA: Insufficient documentation

## 2013-06-27 DIAGNOSIS — E119 Type 2 diabetes mellitus without complications: Secondary | ICD-10-CM | POA: Insufficient documentation

## 2013-06-27 DIAGNOSIS — N816 Rectocele: Secondary | ICD-10-CM | POA: Insufficient documentation

## 2013-06-27 DIAGNOSIS — Z79899 Other long term (current) drug therapy: Secondary | ICD-10-CM | POA: Insufficient documentation

## 2013-06-27 DIAGNOSIS — I10 Essential (primary) hypertension: Secondary | ICD-10-CM | POA: Insufficient documentation

## 2013-06-27 DIAGNOSIS — N8111 Cystocele, midline: Secondary | ICD-10-CM | POA: Insufficient documentation

## 2013-06-27 DIAGNOSIS — Z90710 Acquired absence of both cervix and uterus: Secondary | ICD-10-CM | POA: Insufficient documentation

## 2013-06-27 DIAGNOSIS — Z9079 Acquired absence of other genital organ(s): Secondary | ICD-10-CM | POA: Insufficient documentation

## 2013-06-27 MED ORDER — TRAMADOL HCL 50 MG PO TABS: 50.0000 mg | ORAL_TABLET | Freq: Four times a day (QID) | ORAL | Status: AC | PRN

## 2013-06-27 NOTE — Progress Notes (Signed)
Office Visit Note: Gyn-Onc    Ann Baldwin 67 y.o. female  CC:  Synchronous ovarian/colon cancer.  Assessment/Plan:  [name]  is a 67 y.o.  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  She underwent interval debulking 07/26/2012  and findings were consistent with a synchronous ovarian and ascending colon malignancy. Postoperative course was complicated by either bowel obstruction caused by an internal hernia or ischemia that spontaneously resolved.  The patient completed chemotherapy 12/2011.  Imaging study for surveillance??? Notable for possible abscess, anastomotic leak, recurrence.   Patient is well reports diagnosis of bronchitis yesterday.  Currently on doxycyclin CA 125 not evaluated today because of concurrent bronchitis.  Counseled on the signs and symptoms of intestinal obstruction.    F/U in 3 months.  Will consider repeat CT scan.  Pain requested medication for non specific pain.  Rx. Voltaren instead of hydrocodone  HPI: 66 y.o.  y/o G2P2 LNMP 40's  presented in April 2013 with abdominal discomfort, diarrhea, nausea, no constipation  that was thought to be IBD. The symptoms persisted and she was referred for colonoscopy 01/26/2012.  Findings were notable for  A large ulcer with heaped up margins in the ascending colon.  It was difficult to see and to obtain biopsies.    Pathology was c/w adenocarcinoma and the concern that it was not a primary colonic adenocarcinoma.    CT abdomen and pelvis 02/01/2012 with a 5x6.2 cmcomplex solid and cystic left adnexal mass, 3.9x2.9 right ovarian soft tissue mass, omental caking and peritoneal carcinomatosis.    The initial CA 125 returned > 22,000 CEA wnl, immuno stains of the biopsy were not c/w colon cancer.  Cr was > 8 and the patient was admitted with acute renal failure. Cytology from paracentesis  was c/w Gyn primary. She has received 6 cycles of dose dense Taxol/carboplatin. Cycle 6was administered on 06/23/2012. CA 125 34.7 06/2012  A CT scan of the abdomen and pelvis collected on 05/12/2012 IMPRESSION:  1. Interval response to therapy. There has been near complete resolution of abdominal ascites and peritoneal nodularity. 2. Interval decrease in the size of bilateral adnexal cystic lesions. 3. Small soft tissue attenuating nodule arising from the uterine fundus is unchanged in size from previous exam and is favored to represent a subserosal (exophytic) fibroid. 4. No new or progressive disease identified. 5. Small subpleural nodule in the right middle lobe is stable from previous exam.   In July 26 2012 she underwent total abdominal hysterectomy bilateral salpingo-oophorectomy omentectomy resection of the midportion of the ascending colon with ileotransverse enterocolostomy. 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  Postoperatively she was diagnosed with pneumonia and a UTI.   Earlier in March she was admitted with abdominal pain. The differential diagnosis as well as ischemia versus obstruction. This process resolved with conservative management. Patient states that she feels well.  She reports residual fatigue.  Molecular evaluation of the colon cancer IHC: No loss of expression of MSH2 and MSH6, loss expression of PMS2 and MLH1. ([id number]) MSI PCR: Microsatellite instability-high (BAT-26, NR-21, BAT-25, MONO-27 and NR-24: unstable; [id number]).  Patient has been doing well.  A CT was ordered by a physician covering  for Dr. Darrold Span and was notable: " At the level of the small bowel/right colon anastomoses, there is thickening of the walls and mild  infiltration surrounding fat planes with a 3.2 x 2.5 x 2.2 cm loculated fluid collection. This fluid is in a different position than fluid noted on prior examination. Etiology indeterminate. As this is immediately adjacent to the anastomoses, this may represent a leak from the anastomoses and small contained abscess. Ovarian metastatic disease felt to be less likely consideration. Small adjacent lymph nodes  Ann Baldwin feels better than ever.  Great appetite, no bloating no n/v no fever, chills, no abdominal pain. Lab Results  Component Value Date   WBC 6.6 04/06/2013   HGB 12.7 04/06/2013   HCT 37.3 04/06/2013   MCV 95.7 04/06/2013   PLT 248 04/06/2013     Past Surgical Hx:  Past Surgical History  Procedure Laterality Date  . No past surgeries    . Laparotomy  07/26/2012    Procedure: EXPLORATORY LAPAROTOMY;  Surgeon: Laurette Schimke, MD PHD;  Location: [facility];  Service: Gynecology;  Laterality: N/A;  Exploratory Laporatomy/Total Abdominal Hysterectomy Bilatderal Salpingo Oopherectomy/Tumor Debulking  . Abdominal hysterectomy  07/26/2012    Procedure: HYSTERECTOMY ABDOMINAL;  Surgeon: Laurette Schimke, MD PHD;  Location: [facility];  Service: Gynecology;;  . Salpingoophorectomy  07/26/2012    Procedure: SALPINGO OOPHORECTOMY;  Surgeon: Laurette Schimke, MD PHD;  Location: [facility];  Service: Gynecology;  Laterality: Bilateral;  . Omentectomy  07/26/2012    Procedure: OMENTECTOMY;  Surgeon: Laurette Schimke, MD PHD;  Location: [facility];  Service: Gynecology;;  . Colostomy revision  07/26/2012    Procedure: COLON RESECTION RIGHT;  Surgeon: Laurette Schimke, MD PHD;  Location: [facility];  Service: Gynecology;;  with reanastomsis    Past Medical Hx:  Past Medical History  Diagnosis Date  . Diabetes mellitus   . Hyperlipidemia   . Hypertension     no current meds for since aug 2013  . Anemia   . Ovarian cancer 2013  . Colon cancer 2013    spread from her ovarian cancer  Last mammogram 2 years ago wnl.  Past  Gynecological History: G2 P2 LNMP in 40's.  Menarche 12 regular menses until menopause.  No contraceptives used.  No h/o abnormal pap test, Last pap 10 years ago.  NSVD x 2.  No LMP recorded. Patient has had a hysterectomy.  Family Hx:  Family History  Problem Relation Age of Onset  . Colon cancer Neg Hx   . Esophageal cancer Neg Hx   . Stomach cancer Neg Hx   . Rectal cancer Neg Hx   . Kidney cancer Maternal Uncle   . Uterine cancer Sister 73  . Prostate cancer Cousin     early 56s  . Lymphoma Cousin     Hodgkin's Lymphoma  . Lung cancer Cousin     Review of Systems:  Constitutional  Feels good. Cardiovascular  No chest pain, shortness of breath,  Pulmonary  Cough productive of clear sputum Gastro Intestinal  No nausea or vomiting, no bloating, no bright red blood per rectum.  No constipation, no diarrhea,  No abdominal pain occasional nausea Genito Urinary  No vaginal bleeding or discharge ID  no fever or chills  Vitals:  Blood pressure 126/73, pulse 84, temperature 98.4 F (36.9 C), temperature source Oral, resp. rate 16, height 5\' 6"  (1.676 m), weight 162 lb 1.6 oz (73.528 kg).  PHYSICAL EXAMINATION  Neck  Supple without any enlargements.  Cardiovascular  Pulse normal rate, regularity and rhythm. S1 and S2 normal,   Lungs  Clear to auscultation bilateraly, Good air movement.  Psychiatry  Alert and oriented to person, place, and time  Abdomen  Normoactive bowel sounds, abdomen soft, non-tender and obese. Surgical  sites intact without evidence of hernia. No ascites or palpable masses.  No guarding, no tenderness, no rebound Genitala:  Normal external genitalia Bartholin's urethra and Skene's grade 1 cystocele and grade 1 rectocele evident no palpable pelvic masses or nodularity Rectal Good tone no masses no rectovaginal septum nodularity Extremities  No bilateral cyanosis, clubbing or edema. No rash, lesions or petiche.    Laurette Schimke, MD,  PhD 06/27/2013, 2:54 PM

## 2013-06-27 NOTE — Patient Instructions (Signed)
F/U in 3 months Merry Christmas!    Thank you very much Ms. [name] for allowing me to provide care for you today.  I appreciate your confidence in choosing our Gynecologic Oncology team.  If you have any questions about your visit today please call our office and we will get back to you as soon as possible.  Maryclare Labrador. Austynn Pridmore MD., PhD Gynecologic Oncology

## 2013-06-28 ENCOUNTER — Telehealth: Payer: Self-pay | Admitting: Oncology

## 2013-06-28 ENCOUNTER — Ambulatory Visit (HOSPITAL_BASED_OUTPATIENT_CLINIC_OR_DEPARTMENT_OTHER): Payer: Medicare Other

## 2013-06-28 ENCOUNTER — Other Ambulatory Visit (HOSPITAL_BASED_OUTPATIENT_CLINIC_OR_DEPARTMENT_OTHER): Payer: Medicare Other

## 2013-06-28 ENCOUNTER — Other Ambulatory Visit: Payer: Self-pay

## 2013-06-28 ENCOUNTER — Telehealth: Payer: Self-pay | Admitting: Internal Medicine

## 2013-06-28 ENCOUNTER — Ambulatory Visit (HOSPITAL_BASED_OUTPATIENT_CLINIC_OR_DEPARTMENT_OTHER): Payer: Medicare Other | Admitting: Oncology

## 2013-06-28 VITALS — BP 146/71 | HR 86 | Temp 98.4°F | Resp 17 | Ht 66.0 in | Wt 161.9 lb

## 2013-06-28 DIAGNOSIS — C182 Malignant neoplasm of ascending colon: Secondary | ICD-10-CM

## 2013-06-28 DIAGNOSIS — C569 Malignant neoplasm of unspecified ovary: Secondary | ICD-10-CM

## 2013-06-28 DIAGNOSIS — C779 Secondary and unspecified malignant neoplasm of lymph node, unspecified: Secondary | ICD-10-CM

## 2013-06-28 DIAGNOSIS — D649 Anemia, unspecified: Secondary | ICD-10-CM

## 2013-06-28 DIAGNOSIS — J209 Acute bronchitis, unspecified: Secondary | ICD-10-CM

## 2013-06-28 LAB — COMPREHENSIVE METABOLIC PANEL (CC13)
ALT: 11 U/L (ref 0–55)
AST: 12 U/L (ref 5–34)
Albumin: 3.7 g/dL (ref 3.5–5.0)
Anion Gap: 11 mEq/L (ref 3–11)
BUN: 24.7 mg/dL (ref 7.0–26.0)
CO2: 23 mEq/L (ref 22–29)
Calcium: 9.9 mg/dL (ref 8.4–10.4)
Chloride: 107 mEq/L (ref 98–109)
Creatinine: 0.8 mg/dL (ref 0.6–1.1)
Potassium: 3.9 mEq/L (ref 3.5–5.1)

## 2013-06-28 LAB — CBC WITH DIFFERENTIAL/PLATELET
BASO%: 0.2 % (ref 0.0–2.0)
Basophils Absolute: 0 10*3/uL (ref 0.0–0.1)
Eosinophils Absolute: 0.1 10*3/uL (ref 0.0–0.5)
HCT: 35.4 % (ref 34.8–46.6)
HGB: 11.6 g/dL (ref 11.6–15.9)
LYMPH%: 26.3 % (ref 14.0–49.7)
MCH: 31.3 pg (ref 25.1–34.0)
MCHC: 32.8 g/dL (ref 31.5–36.0)
MCV: 95.4 fL (ref 79.5–101.0)
MONO#: 0.4 10*3/uL (ref 0.1–0.9)
MONO%: 7.4 % (ref 0.0–14.0)
NEUT#: 3.8 10*3/uL (ref 1.5–6.5)
NEUT%: 65.2 % (ref 38.4–76.8)
Platelets: 277 10*3/uL (ref 145–400)
WBC: 5.8 10*3/uL (ref 3.9–10.3)
lymph#: 1.5 10*3/uL (ref 0.9–3.3)
nRBC: 0 % (ref 0–0)

## 2013-06-28 MED ORDER — HEPARIN SOD (PORK) LOCK FLUSH 100 UNIT/ML IV SOLN
500.0000 [IU] | Freq: Once | INTRAVENOUS | Status: AC
  Administered 2013-06-28: 500 [IU] via INTRAVENOUS
  Filled 2013-06-28: qty 5

## 2013-06-28 MED ORDER — SODIUM CHLORIDE 0.9 % IJ SOLN
10.0000 mL | INTRAMUSCULAR | Status: DC | PRN
  Administered 2013-06-28: 10 mL via INTRAVENOUS
  Filled 2013-06-28: qty 10

## 2013-06-28 NOTE — Telephone Encounter (Signed)
She should have one anytime now Hx colon cancer

## 2013-06-28 NOTE — Telephone Encounter (Signed)
Dr. Leone Payor, when is she due for next colon.  Her procedure results from 01/26/2012, indicates no recall.  Please advise

## 2013-06-28 NOTE — Telephone Encounter (Signed)
I have left a message for Melissa and the patient with Dr. Marvell Fuller recommendations.

## 2013-06-28 NOTE — Telephone Encounter (Signed)
gv pt appt schedule for february and april. s/w wendy @  [facility] cl[facility]onoscopy jan 2015. per wendy she could not find anything that says pt is due jan 2015 and last colonoscopy was july 2013. per wendy she sent a message to dr. Marvell Fuller assistance to check into when colonoscopy is actually due and assitant will call me back.

## 2013-06-29 ENCOUNTER — Ambulatory Visit: Payer: Medicare Other

## 2013-06-29 ENCOUNTER — Ambulatory Visit: Payer: Medicare Other | Admitting: Gynecologic Oncology

## 2013-06-29 ENCOUNTER — Other Ambulatory Visit: Payer: Medicare Other | Admitting: Lab

## 2013-06-29 LAB — CEA: CEA: 1.2 ng/mL (ref 0.0–5.0)

## 2013-06-29 NOTE — Telephone Encounter (Signed)
Left message for patient to call back  

## 2013-06-30 ENCOUNTER — Encounter: Payer: Self-pay | Admitting: Oncology

## 2013-06-30 ENCOUNTER — Telehealth: Payer: Self-pay

## 2013-06-30 NOTE — Telephone Encounter (Signed)
Message copied by Lorine Bears on Fri Jun 30, 2013  3:03 PM ------      Message from: Reece Packer      Created: Fri Jun 30, 2013 12:02 PM       Labs seen and need follow up: please let her know chemistries, CEA for colon cancer and ca125 for gyn cancer all look good. I still would like her to have colonoscopy ~ Jan with Dr Leone Payor, which I cannot tell is set up yet. ------

## 2013-06-30 NOTE — Progress Notes (Signed)
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 Right hemicolectomy was done at that surgery by Dr Nelly Rout 07-26-12 and she had additional post operative taxol chemotherapy thru 11-02-12. Due to performance status and overall situation, decision was made then not to attempt additional adjuvant chemotherapy for the colon cancer. Last imaging was CT AP 03-16-13, ordered by another medical oncologist, which showed question of anastomotic leak.  Dr Nelly Rout spoke with patient after that scan, at which time patient was asymptomatic and declined admission to [facility]. She saw Dr Nelly Rout 03-30-13. She saw Dr Nelly Rout again yesterday (06-27-13) and will see her again in ~ 3 mo. She is not presently scheduled back to Dr Leone Payor, but is willing to have repeat colonoscopy, appropriate now at 1 year from colon surgery and additionally with the question of anastomotic leak by Sept CT. Patient has PAC in, flushed today.  Patient has had bronchitis for past week, improving on antibiotic from PCP (doxycycline); cough is now productive of clear sputum, no fever, congested but not frankly short of breath and no chest pain. Prior to the bronchitis she had felt generally well, able to be much more active at home, appetite and energy good, no abdominal or pelvic pain or distension, no vomiting, no bleeding, no LE swelling, no change in bowels.   ONCOLOGIC HISTORY Patient presented with GI symptoms thought IBD in April 2013. She had colonoscopy by Dr Stan Head 01-26-12, with large ulcer in ascending colon, pathology  [phone] found adenocarcinoma consistent with noncolonic primary. CT AP (with contrast) in [facility] 02-02-12 had complex solid and cystic left adnexal mass, right ovarian mass, omental caking and peritoneal carcinomatosis. She was seen by Dr Nelly Rout on 02-09-12, with CA 125 of 22,938 and CEA 0.5; CMET resulted after Dr Forrestine Him visit had BUN 63 and creatinine 8.78. She was admitted to [facility] 7-31 thru 02-21-12 with acute renal failure felt multifactorial including prerenal with N/V/diarrhea/ascites, NSAID and ACE inhibitors, lasix, IV contrast, HTN and DM. Renal function progressively improved, allowing addition of careful carboplatin to taxol beginning cycle 3 chemotherapy. She had paracentesis of 5 liters on 02-11-12 and 2.4 liters on 02-18-12. CA 125, which was 22,938 at presentation, was down to 1835 prior to cycle 4 (lab done 04-04-12), 98 by Nov 2013 and 35 by 06-2012. She had first 2 cycles of weekly taxol as single agent following the renal problems on presentation, first cycle started on 02-24-12, tolerated adequately tho blood sugars elevate significantly with her diabetes and the steroids. She had carboplatin added day 1 beginning cycle 3 (04-07-12), total of 4 cycles with carbo + taxol thru 06-23-12. She did not require gCSF with the neoadjuvant treatment. Surgery by Dr Nelly Rout was 07-26-12, complete debulking of left ovarian primary and also right hemicolectomy with reanastomosis and 2 lymph node evaluation for the unexpected finding of synchronous primary colon cancer. The colon cancer had involvement of 1 of 2 regional nodes without microsatellite instability.  She had additional weekly taxol from 3-12 thru 11-02-12. She had feraheme 09-27-12.  Review of systems as above, also: No abdominal discomfort or fever around time of CT AP in September. No other respiratory  symptoms. No peripheral neuropathy now. No problems with PAC. Remainder of 10 point Review of Systems negative.  Objective:  Vital  signs in last 24 hours:  BP 146/71  Pulse 86  Temp(Src) 98.4 F (36.9 C) (Oral)  Resp 17  Ht 5\' 6"  (1.676 m)  Wt 161 lb 14.4 oz (73.437 kg)  BMI 26.14 kg/m2  SpO2 99% weight is up 9.5 lbs from May.   Alert, oriented and appropriate, very pleasant as always. Ambulatory without difficulty. Slightly hoarse and sounds nasally congested, but does not appear acutely ill. No alopecia Granddaughter very attentive.  HEENT:PERRL, sclerae not icteric. Oral mucosa moist without lesions, posterior pharynx with dull erythema bilaterally otherwise clear. Neck supple. No JVD.  Lymphatics:no cervical,suraclavicular, axillary or inguinal adenopathy Resp: clear to auscultation bilaterally and normal percussion bilaterally Cardio: regular rate and rhythm. No gallop. GI: soft, nontender, not distended, no mass or organomegaly. Normally active bowel sounds. Surgical incision not remarkable. Musculoskeletal/ Extremities: without pitting edema, cords, tenderness Neuro: no peripheral neuropathy. Otherwise nonfocal. Psych as above Skin without rash, ecchymosis, petechiae Portacath-without erythema or tenderness  Lab Results:  Results for orders placed in visit on 06/28/13  CBC WITH DIFFERENTIAL      Result Value Range   WBC 5.8  3.9 - 10.3 10e3/uL   NEUT# 3.8  1.5 - 6.5 10e3/uL   HGB 11.6  11.6 - 15.9 g/dL   HCT 46.9  62.9 - 52.8 %   Platelets 277  145 - 400 10e3/uL   MCV 95.4  79.5 - 101.0 fL   MCH 31.3  25.1 - 34.0 pg   MCHC 32.8  31.5 - 36.0 g/dL   RBC 4.13  2.44 - 0.10 10e6/uL   RDW 12.5  11.2 - 14.5 %   lymph# 1.5  0.9 - 3.3 10e3/uL   MONO# 0.4  0.1 - 0.9 10e3/uL   Eosinophils Absolute 0.1  0.0 - 0.5 10e3/uL   Basophils Absolute 0.0  0.0 - 0.1 10e3/uL   NEUT% 65.2  38.4 - 76.8 %   LYMPH% 26.3  14.0 - 49.7 %   MONO% 7.4  0.0 - 14.0 %   EOS% 0.9  0.0 - 7.0 %   BASO% 0.2  0.0 - 2.0 %   nRBC 0  0 - 0 %  COMPREHENSIVE METABOLIC PANEL (CC13)      Result Value Range   Sodium 141  136 - 145  mEq/L   Potassium 3.9  3.5 - 5.1 mEq/L   Chloride 107  98 - 109 mEq/L   CO2 23  22 - 29 mEq/L   Glucose 126  70 - 140 mg/dl   BUN 27.2  7.0 - 53.6 mg/dL   Creatinine 0.8  0.6 - 1.1 mg/dL   Total Bilirubin 6.44  0.20 - 1.20 mg/dL   Alkaline Phosphatase 103  40 - 150 U/L   AST 12  5 - 34 U/L   ALT 11  0 - 55 U/L   Total Protein 7.0  6.4 - 8.3 g/dL   Albumin 3.7  3.5 - 5.0 g/dL   Calcium 9.9  8.4 - 03.4 mg/dL   Anion Gap 11  3 - 11 mEq/L  CA 125      Result Value Range   CA 125 12.5  0.0 - 30.2 U/mL  CEA      Result Value Range   CEA 1.2  0.0 - 5.0 ng/mL    Hgb had been 12.7 in Sept 2014  Markers and  chemistries available after visit today.  Studies/Results:  CT AP FROM 03-16-13 report and PACS images reviewed by this MD now: IMPRESSION:  At the level of the small bowel/right colon anastomoses, there is  thickening of the walls and mild infiltration surrounding fat  planes with a 3.2 x 2.5 x 2.2 cm loculated fluid collection. This  fluid is in a different position than fluid noted on prior  examination. Etiology indeterminate. As this is immediately  adjacent to the anastomoses, this may represent a leak from the  anastomoses and small contained abscess. Ovarian metastatic  disease felt to be less likely consideration. Small adjacent lymph  nodes.  Anterior abdominal wall hernia contains portion of transverse colon  which does not cause obstruction currently although places the  patient at risk for obstruction.  Minimal nodularity lung bases without significant change.  Attention to this on follow-up.  Coronary artery calcifications.  Atherosclerotic type changes of the aorta without aneurysmal  dilation. Atherosclerotic type changes of the aorta branch vessels  including iliac arteries which are narrowed.     Medications: I have reviewed the patient's current medications. She uses occasional hydrocodone, last script in Nov. Has had flu vaccine elsewhere  DISCUSSION:  clinically patient looks very well from standpoint of gyn and colon cancers, tho CT in Sept had findings as noted. Will request colonoscopy by Dr Leone Payor in ~ Jan. I will follow her with labs including markers q 3-4 months, coordinating with PAC flushes as possible, and she will keep Dr Forrestine Him appointment as scheduled.  Assessment/Plan: 1. 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 Will follow CA 125 with PAC flushes in 2 mo and 4 mo. 2. T4aN1a adenocarcinoma of ascending colon: post resection 07-26-2012 with involvement of 1 of 2 regional nodes, no microsatellite instability in this specimen. Decision to follow with close observation rather than adjuvant chemotherapy due to performance status and patient wishes by completion of adjuvant treatment for the ovarian cancer, as well as risk of recurrence of the gyn cancer felt higher than risk of colon cancer. She needs to be seen by medical oncology for this follow up ~ every 3-4 months, with CEA again from [facility] in 4 mo. Referral back to Dr Leone Payor for year follow up colonoscopy.  3.PAC in, needs flushes every 6-8 weeks. Peripheral IV access is difficult and she is at risk of recurrent disease such that PAC has not been removed 4.acute bronchitis improving on antibiotics in process  5.diabetes on insulin  6.acute renal failure multifactorial just after diagnosis of gyn cancer in August 2013, with creatinine >8 then, resolved but still need to be cautious of renal toxic interventions  7.hx HTN 8.anemia: lower but asymptomatic today, still on oral iron in addition to the feraheme in March 9.flu vaccine done   Patient and granddaughter are in agreement with plan  above.    Reece Packer, MD

## 2013-06-30 NOTE — Telephone Encounter (Signed)
Told Ann Baldwin the results of her lab from visit 06-28-13 as noted below by Dr. Darrold Span. Dr. Marvell Fuller office called her yesterday but she has not returned the call.  She was helping her sister who had surgery.  Encouraged her to call his office to se appointment up in January as Dr. Darrold Span requested.  Pt. Stated that she will contact Dr. Marvell Fuller office.

## 2013-07-03 NOTE — Telephone Encounter (Signed)
Patient is scheduled for pre-visit 07/18/13 and colon 07/24/13

## 2013-07-03 NOTE — Telephone Encounter (Signed)
Left message for patient to call back  

## 2013-07-18 ENCOUNTER — Ambulatory Visit (AMBULATORY_SURGERY_CENTER): Payer: Self-pay

## 2013-07-18 VITALS — Ht 63.0 in | Wt 162.0 lb

## 2013-07-18 DIAGNOSIS — Z85038 Personal history of other malignant neoplasm of large intestine: Secondary | ICD-10-CM

## 2013-07-18 MED ORDER — MOVIPREP 100 G PO SOLR: 1.0000 | Freq: Once | ORAL | Status: DC

## 2013-07-24 ENCOUNTER — Encounter: Payer: Self-pay | Admitting: Internal Medicine

## 2013-07-24 ENCOUNTER — Ambulatory Visit (AMBULATORY_SURGERY_CENTER): Payer: Medicare Other | Admitting: Internal Medicine

## 2013-07-24 VITALS — BP 146/76 | HR 75 | Temp 99.0°F | Resp 21 | Ht 63.0 in | Wt 162.0 lb

## 2013-07-24 DIAGNOSIS — Z85038 Personal history of other malignant neoplasm of large intestine: Secondary | ICD-10-CM | POA: Insufficient documentation

## 2013-07-24 DIAGNOSIS — K5289 Other specified noninfective gastroenteritis and colitis: Secondary | ICD-10-CM

## 2013-07-24 DIAGNOSIS — D126 Benign neoplasm of colon, unspecified: Secondary | ICD-10-CM

## 2013-07-24 DIAGNOSIS — R933 Abnormal findings on diagnostic imaging of other parts of digestive tract: Secondary | ICD-10-CM

## 2013-07-24 LAB — GLUCOSE, CAPILLARY
GLUCOSE-CAPILLARY: 146 mg/dL — AB (ref 70–99)
Glucose-Capillary: 135 mg/dL — ABNORMAL HIGH (ref 70–99)

## 2013-07-24 MED ORDER — SODIUM CHLORIDE 0.9 % IV SOLN: 500.0000 mL | INTRAVENOUS | Status: DC

## 2013-07-24 NOTE — Progress Notes (Signed)
Called to room to assist during endoscopic procedure.  Patient ID and intended procedure confirmed with present staff. Received instructions for my participation in the procedure from the performing physician.  

## 2013-07-24 NOTE — Progress Notes (Signed)
Patient did not experience any of the following events: a burn prior to discharge; a fall within the facility; wrong site/side/patient/procedure/implant event; or a hospital transfer or hospital admission upon discharge from the facility. (G8907)Patient did not have preoperative order for IV antibiotic SSI prophylaxis. (G8918) ewm 

## 2013-07-24 NOTE — Progress Notes (Signed)
PT HAS A PORT A CATH TO RIGHT CHEST. EWM NO EGG OR SOY ALLERGY. EWM NO PROBLEMS WITH PAST SEDATION. EWM

## 2013-07-24 NOTE — Progress Notes (Signed)
Procedure ends, to recovery, report given and VSS. 

## 2013-07-24 NOTE — Patient Instructions (Addendum)

## 2013-07-24 NOTE — Op Note (Addendum)
[facility]  [address] [place], [id number]   COLONOSCOPY PROCEDURE REPORT  PATIENT: Ann Baldwin, Ann Baldwin  MR#: [id number] BIRTHDATE: 1945-11-15 , 30  yrs. old GENDER: Female ENDOSCOPIST: Gatha Mayer, MD, [facility] PROCEDURE DATE:  07/24/2013 PROCEDURE:   Colonoscopy with biopsy ASA CLASS:   Class II INDICATIONS:High risk patient with personal history of colon cancer. and ovarian cancer  S/p resection of both 2014, after neoadjuvant chemoTx MEDICATIONS: Propofol (Diprivan) 240 mg IV, MAC sedation, administered by CRNA, and These medications were titrated to patient response per physician's verbal order  DESCRIPTION OF PROCEDURE:   After the risks benefits and alternatives of the procedure were thoroughly explained, informed consent was obtained.  A digital rectal exam revealed no abnormalities of the rectum.   The LB [id number]  endoscope was introduced through the anus and advanced to the surgical anastomosis. No adverse events experienced.   The quality of the prep was excellent using Suprep  The instrument was then slowly withdrawn as the colon was fully examined.  COLON FINDINGS: There was evidence of a prior end-to-side ileocolonic surgical anastomosis in the distal transverse colon. Abnormal mucosa was found in the distal transverse colon at blind end. The mucosa was erythematous, friable, congested, edematous and had petechiae.  Multiple biopsies were performed using cold forceps.   Abnormal mucosa was found in the descending colon, associated with diverticulum (I think).  The mucosa was erythematous, friable and edematous.  Multiple biopsies were performed using cold forceps.   A semi-pedunculated polyp measuring 8 mm in size was found in the descending colon.  A polypectomy was performed using snare cautery.  The resection was complete and the polyp tissue was completely retrieved.  Retroflexed views revealed no abnormalities. The time to cecum  not recorded Withdrawal time not recorded     .  The scope was withdrawn and the procedure completed. COMPLICATIONS: There were no complications.  ENDOSCOPIC IMPRESSION: 1.   There was evidence of a prior ileocolonic surgical anastomosis in the distal transverse colon 2.   Abnormal mucosa was found in the distal transverse colon; multiple biopsies were performed using cold forceps 3.   Abnormal mucosa was found in the descending colon; multiple biopsies were performed using cold forceps 4.   Semi-pedunculated polyp measuring 8 mm in size was found in the descending colon; polypectomy was performed using snare cautery  RECOMMENDATIONS: 1.  Await biopsy results 2.  Office will call with the results.  eSigned:  Gatha Mayer, MD, [facility] 07/24/2013 11:53 AM  cc: Evlyn Clines, MD, Dagmar Hait, MD [place], [place]) and and The Patient

## 2013-07-25 ENCOUNTER — Telehealth: Payer: Self-pay | Admitting: *Deleted

## 2013-07-25 NOTE — Telephone Encounter (Signed)
  Follow up Call-  Call back number 07/24/2013 01/26/2012  Post procedure Call Back phone  # [phone]  Permission to leave phone message Yes Yes     Patient questions:  Do you have a fever, pain , or abdominal swelling? no Pain Score  0 *  Have you tolerated food without any problems? yes  Have you been able to return to your normal activities? yes  Do you have any questions about your discharge instructions: Diet   no Medications  no Follow up visit  no  Do you have questions or concerns about your Care? no  Actions: * If pain score is 4 or above: No action needed, pain <4.

## 2013-07-31 ENCOUNTER — Encounter: Payer: Self-pay | Admitting: Internal Medicine

## 2013-07-31 NOTE — Progress Notes (Signed)
Quick Note:  Please call her and let her know no cancer - just inflammation where she was sewn together - we see that Plan to repeat a colonoscopy in 3 years Will send a letter also but she is waiting to hear ______

## 2013-08-02 ENCOUNTER — Telehealth: Payer: Self-pay | Admitting: *Deleted

## 2013-08-02 ENCOUNTER — Other Ambulatory Visit: Payer: Self-pay

## 2013-08-02 ENCOUNTER — Encounter: Payer: Self-pay | Admitting: Oncology

## 2013-08-02 DIAGNOSIS — C182 Malignant neoplasm of ascending colon: Secondary | ICD-10-CM

## 2013-08-02 MED ORDER — LORAZEPAM 0.5 MG PO TABS
ORAL_TABLET | ORAL | Status: AC
Start: 2013-08-02 — End: ?

## 2013-08-02 NOTE — Telephone Encounter (Signed)
Prior authorization request received from [id number] in [place], [place]. For ondansetron previously filled at [place] to [place].

## 2013-08-02 NOTE — Progress Notes (Signed)
[place], [id number], approved ondansetron 8mg  from 08/02/12-08/02/13.

## 2013-08-23 ENCOUNTER — Ambulatory Visit (HOSPITAL_BASED_OUTPATIENT_CLINIC_OR_DEPARTMENT_OTHER): Payer: Medicare Other

## 2013-08-23 ENCOUNTER — Other Ambulatory Visit: Payer: Medicare Other

## 2013-08-23 VITALS — BP 128/67 | HR 97 | Temp 98.3°F

## 2013-08-23 DIAGNOSIS — C569 Malignant neoplasm of unspecified ovary: Secondary | ICD-10-CM

## 2013-08-23 DIAGNOSIS — Z452 Encounter for adjustment and management of vascular access device: Secondary | ICD-10-CM

## 2013-08-23 DIAGNOSIS — Z95828 Presence of other vascular implants and grafts: Secondary | ICD-10-CM

## 2013-08-23 MED ORDER — HEPARIN SOD (PORK) LOCK FLUSH 100 UNIT/ML IV SOLN
500.0000 [IU] | Freq: Once | INTRAVENOUS | Status: AC
  Administered 2013-08-23: 500 [IU] via INTRAVENOUS
  Filled 2013-08-23: qty 5

## 2013-08-23 MED ORDER — SODIUM CHLORIDE 0.9 % IJ SOLN
10.0000 mL | INTRAMUSCULAR | Status: DC | PRN
  Administered 2013-08-23: 10 mL via INTRAVENOUS
  Filled 2013-08-23: qty 10

## 2013-08-23 NOTE — Patient Instructions (Signed)

## 2013-08-24 LAB — CA 125: CA 125: 16.6 U/mL (ref 0.0–30.2)

## 2013-08-29 ENCOUNTER — Inpatient Hospital Stay (HOSPITAL_COMMUNITY)
Admission: EM | Admit: 2013-08-29 | Discharge: 2013-08-31 | DRG: 756 | Disposition: A | Discharge status: Other Acute Inpatient Hospital | Payer: Medicare Other | Attending: Internal Medicine | Admitting: Internal Medicine

## 2013-08-29 ENCOUNTER — Encounter (HOSPITAL_COMMUNITY): Payer: Self-pay | Admitting: Emergency Medicine

## 2013-08-29 ENCOUNTER — Emergency Department (HOSPITAL_COMMUNITY): Payer: Medicare Other

## 2013-08-29 DIAGNOSIS — Z85038 Personal history of other malignant neoplasm of large intestine: Secondary | ICD-10-CM | POA: Diagnosis present

## 2013-08-29 DIAGNOSIS — C569 Malignant neoplasm of unspecified ovary: Principal | ICD-10-CM | POA: Diagnosis present

## 2013-08-29 DIAGNOSIS — R109 Unspecified abdominal pain: Secondary | ICD-10-CM | POA: Diagnosis present

## 2013-08-29 DIAGNOSIS — Z6826 Body mass index (BMI) 26.0-26.9, adult: Secondary | ICD-10-CM

## 2013-08-29 DIAGNOSIS — E876 Hypokalemia: Secondary | ICD-10-CM | POA: Diagnosis present

## 2013-08-29 DIAGNOSIS — D638 Anemia in other chronic diseases classified elsewhere: Secondary | ICD-10-CM | POA: Diagnosis present

## 2013-08-29 DIAGNOSIS — R634 Abnormal weight loss: Secondary | ICD-10-CM | POA: Diagnosis present

## 2013-08-29 DIAGNOSIS — R1031 Right lower quadrant pain: Secondary | ICD-10-CM | POA: Diagnosis present

## 2013-08-29 DIAGNOSIS — Z7982 Long term (current) use of aspirin: Secondary | ICD-10-CM

## 2013-08-29 DIAGNOSIS — R1900 Intra-abdominal and pelvic swelling, mass and lump, unspecified site: Secondary | ICD-10-CM

## 2013-08-29 DIAGNOSIS — I10 Essential (primary) hypertension: Secondary | ICD-10-CM | POA: Diagnosis present

## 2013-08-29 DIAGNOSIS — E119 Type 2 diabetes mellitus without complications: Secondary | ICD-10-CM | POA: Diagnosis present

## 2013-08-29 DIAGNOSIS — E785 Hyperlipidemia, unspecified: Secondary | ICD-10-CM | POA: Diagnosis present

## 2013-08-29 DIAGNOSIS — K5900 Constipation, unspecified: Secondary | ICD-10-CM | POA: Diagnosis present

## 2013-08-29 DIAGNOSIS — Z794 Long term (current) use of insulin: Secondary | ICD-10-CM

## 2013-08-29 DIAGNOSIS — R1909 Other intra-abdominal and pelvic swelling, mass and lump: Secondary | ICD-10-CM | POA: Diagnosis present

## 2013-08-29 HISTORY — DX: Shortness of breath: R06.02

## 2013-08-29 LAB — COMPREHENSIVE METABOLIC PANEL
ALBUMIN: 2.4 g/dL — AB (ref 3.5–5.2)
ALT: 32 U/L (ref 0–35)
AST: 40 U/L — ABNORMAL HIGH (ref 0–37)
Alkaline Phosphatase: 143 U/L — ABNORMAL HIGH (ref 39–117)
BILIRUBIN TOTAL: 0.5 mg/dL (ref 0.3–1.2)
BUN: 17 mg/dL (ref 6–23)
CO2: 24 mEq/L (ref 19–32)
Calcium: 9.1 mg/dL (ref 8.4–10.5)
Chloride: 97 mEq/L (ref 96–112)
Creatinine, Ser: 0.9 mg/dL (ref 0.50–1.10)
GFR calc Af Amer: 74 mL/min — ABNORMAL LOW (ref >90)
GFR, EST NON AFRICAN AMERICAN: 64 mL/min — AB (ref >90)
Glucose, Bld: 138 mg/dL — ABNORMAL HIGH (ref 70–99)
Potassium: 3.6 mEq/L — ABNORMAL LOW (ref 3.7–5.3)
SODIUM: 134 meq/L — AB (ref 137–147)
TOTAL PROTEIN: 6.5 g/dL (ref 6.0–8.3)

## 2013-08-29 LAB — LIPASE, BLOOD: Lipase: 11 U/L (ref 11–59)

## 2013-08-29 LAB — CBC WITH DIFFERENTIAL/PLATELET
BASOS PCT: 0 % (ref 0–1)
Basophils Absolute: 0 10*3/uL (ref 0.0–0.1)
EOS ABS: 0 10*3/uL (ref 0.0–0.7)
EOS PCT: 0 % (ref 0–5)
HCT: 29 % — ABNORMAL LOW (ref 36.0–46.0)
Hemoglobin: 9.4 g/dL — ABNORMAL LOW (ref 12.0–15.0)
LYMPHS ABS: 1.5 10*3/uL (ref 0.7–4.0)
Lymphocytes Relative: 10 % — ABNORMAL LOW (ref 12–46)
MCH: 29.6 pg (ref 26.0–34.0)
MCHC: 32.4 g/dL (ref 30.0–36.0)
MCV: 91.2 fL (ref 78.0–100.0)
Monocytes Absolute: 1.4 10*3/uL — ABNORMAL HIGH (ref 0.1–1.0)
Monocytes Relative: 10 % (ref 3–12)
NEUTROS PCT: 79 % — AB (ref 43–77)
Neutro Abs: 11 10*3/uL — ABNORMAL HIGH (ref 1.7–7.7)
PLATELETS: 575 10*3/uL — AB (ref 150–400)
RBC: 3.18 MIL/uL — AB (ref 3.87–5.11)
RDW: 13.7 % (ref 11.5–15.5)
WBC: 13.9 10*3/uL — ABNORMAL HIGH (ref 4.0–10.5)

## 2013-08-29 LAB — URINALYSIS, ROUTINE W REFLEX MICROSCOPIC
Glucose, UA: NEGATIVE mg/dL
HGB URINE DIPSTICK: NEGATIVE
Ketones, ur: NEGATIVE mg/dL
Nitrite: NEGATIVE
PROTEIN: 30 mg/dL — AB
Specific Gravity, Urine: 1.031 — ABNORMAL HIGH (ref 1.005–1.030)
Urobilinogen, UA: 2 mg/dL — ABNORMAL HIGH (ref 0.0–1.0)
pH: 5.5 (ref 5.0–8.0)

## 2013-08-29 LAB — URINE MICROSCOPIC-ADD ON

## 2013-08-29 LAB — OCCULT BLOOD, POC DEVICE: FECAL OCCULT BLD: NEGATIVE

## 2013-08-29 MED ORDER — SODIUM CHLORIDE 0.9 % IV SOLN
INTRAVENOUS | Status: DC
  Administered 2013-08-29: 22:00:00 via INTRAVENOUS

## 2013-08-29 MED ORDER — IOHEXOL 300 MG/ML  SOLN
50.0000 mL | Freq: Once | INTRAMUSCULAR | Status: AC | PRN
  Administered 2013-08-29: 50 mL via ORAL

## 2013-08-29 MED ORDER — MORPHINE SULFATE 4 MG/ML IJ SOLN
4.0000 mg | Freq: Once | INTRAMUSCULAR | Status: AC
  Administered 2013-08-29: 4 mg via INTRAVENOUS
  Filled 2013-08-29: qty 1

## 2013-08-29 MED ORDER — IOHEXOL 300 MG/ML  SOLN
100.0000 mL | Freq: Once | INTRAMUSCULAR | Status: AC | PRN
  Administered 2013-08-29: 100 mL via INTRAVENOUS

## 2013-08-29 MED ORDER — ONDANSETRON HCL 4 MG/2ML IJ SOLN
4.0000 mg | Freq: Once | INTRAMUSCULAR | Status: AC
  Administered 2013-08-29: 4 mg via INTRAVENOUS
  Filled 2013-08-29: qty 2

## 2013-08-29 NOTE — ED Notes (Signed)
Patient transported to CT 

## 2013-08-29 NOTE — ED Provider Notes (Signed)
CSN: [id number]     Arrival date & time 08/29/13  1952 History   First MD Initiated Contact with Patient 08/29/13 2023     Chief Complaint  Patient presents with  . Abdominal Pain     (Consider location/radiation/quality/duration/timing/severity/associated sxs/prior Treatment) HPI Comments: Patient with a history of past ovarian and colon cancer status post abdominal hysterectomy, salpingoophorectomy, and omentectomy in Jan 2014 presents with abdominal pain. She completed cancer treatments about a year ago. She states she's currently cancer free. She reports a 4 to five-day history of pain in her right side abdomen is been worsening over the last 2 or 3 days. She does have a history of constipation and hasn't had a bowel movement about 4 days. She does say that she's never had this pain with the constipation the past. She is passing gas. She's had some nausea but no vomiting. She denies he fevers or chills. She denies any urinary symptoms. She states the pain has been constant but waxing and waning over last few days.  Patient is a 68 y.o. female presenting with abdominal pain.  Abdominal Pain Associated symptoms: constipation and nausea   Associated symptoms: no chest pain, no chills, no cough, no diarrhea, no fatigue, no fever, no hematuria, no shortness of breath and no vomiting     Past Medical History  Diagnosis Date  . Diabetes mellitus   . Hyperlipidemia   . Hypertension     no current meds for since aug 2013  . Anemia   . Ovarian cancer 2013  . Colon cancer 2013    spread from her ovarian cancer  . Blood transfusion without reported diagnosis   . HCAP (healthcare-associated pneumonia) 08/22/2012   Past Surgical History  Procedure Laterality Date  . Wisdom tooth extraction    . Laparotomy  07/26/2012    Procedure: EXPLORATORY LAPAROTOMY;  Surgeon: Laurette Schimke, MD PHD;  Location: [facility];  Service: Gynecology;  Laterality: N/A;  Exploratory Laporatomy/Total Abdominal  Hysterectomy Bilatderal Salpingo Oopherectomy/Tumor Debulking  . Abdominal hysterectomy  07/26/2012    Procedure: HYSTERECTOMY ABDOMINAL;  Surgeon: Laurette Schimke, MD PHD;  Location: [facility];  Service: Gynecology;;  . Salpingoophorectomy  07/26/2012    Procedure: SALPINGO OOPHORECTOMY;  Surgeon: Laurette Schimke, MD PHD;  Location: [facility];  Service: Gynecology;  Laterality: Bilateral;  . Omentectomy  07/26/2012    Procedure: OMENTECTOMY;  Surgeon: Laurette Schimke, MD PHD;  Location: [facility];  Service: Gynecology;;  . Colostomy revision  07/26/2012    Procedure: COLON RESECTION RIGHT;  Surgeon: Laurette Schimke, MD PHD;  Location: [facility];  Service: Gynecology;;  with reanastomsis  . Portacath placement  2013   Family History  Problem Relation Age of Onset  . Colon cancer Neg Hx   . Esophageal cancer Neg Hx   . Stomach cancer Neg Hx   . Rectal cancer Neg Hx   . Kidney cancer Maternal Uncle   . Uterine cancer Sister 89  . Prostate cancer Cousin     early 58s  . Lymphoma Cousin     Hodgkin's Lymphoma  . Lung cancer Cousin    History  Substance Use Topics  . Smoking status: Never Smoker   . Smokeless tobacco: Never Used  . Alcohol Use: No   OB History   Grav Para Term Preterm Abortions TAB SAB Ect Mult Living                 Review of Systems  Constitutional: Negative for fever, chills, diaphoresis and  fatigue.  HENT: Negative for congestion, rhinorrhea and sneezing.   Eyes: Negative.   Respiratory: Negative for cough, chest tightness and shortness of breath.   Cardiovascular: Negative for chest pain and leg swelling.  Gastrointestinal: Positive for nausea, abdominal pain and constipation. Negative for vomiting, diarrhea and blood in stool.  Genitourinary: Negative for frequency, hematuria, flank pain and difficulty urinating.  Musculoskeletal: Negative for arthralgias and back pain.  Skin: Negative for rash.  Neurological: Negative for dizziness, speech difficulty, weakness, numbness  and headaches.      Allergies  Review of patient's allergies indicates no known allergies.  Home Medications   Current Outpatient Rx  Name  Route  Sig  Dispense  Refill  . aspirin 81 MG tablet   Oral   Take 81 mg by mouth every morning.          Marland Kitchen atorvastatin (LIPITOR) 40 MG tablet   Oral   Take 40 mg by mouth at bedtime.          . docusate sodium (COLACE) 100 MG capsule   Oral   Take 100 mg by mouth 2 (two) times daily.          Marland Kitchen ezetimibe (ZETIA) 10 MG tablet   Oral   Take 10 mg by mouth every morning.          Marland Kitchen glimepiride (AMARYL) 1 MG tablet   Oral   Take 1 mg by mouth daily with breakfast.         . insulin regular (NOVOLIN R,HUMULIN R) 100 units/mL injection   Subcutaneous   Inject into the skin 3 (three) times daily before meals. Sliding scale only as needed for BS>200         . lidocaine-prilocaine (EMLA) cream      Apply to [facility] Cath 1- 2 hours prior to access   30 g   1   . LORazepam (ATIVAN) 0.5 MG tablet      TAKE ONE TAB EVERY SIX HOURS AS NEEDED FOR NAUSEA, ANXIETY, OR SLEEP.   30 tablet   0     PCP to refill from here   . ondansetron (ZOFRAN) 8 MG tablet      TAKE ONE TABLET BY MOUTH EVERY EIGHT HOURS AS NEEDED FOR NAUSEA   30 tablet   2   . polyethylene glycol (MIRALAX / GLYCOLAX) packet   Oral   Take 17 g by mouth 2 (two) times daily as needed. constipation         . traMADol (ULTRAM) 50 MG tablet   Oral   Take 1 tablet (50 mg total) by mouth every 6 (six) hours as needed.   60 tablet   1    BP 136/59  Pulse 99  Temp(Src) 99.2 F (37.3 C) (Oral)  Resp 18  Ht 5\' 3"  (1.6 m)  Wt 149 lb (67.586 kg)  BMI 26.40 kg/m2  SpO2 95% Physical Exam  Constitutional: She is oriented to person, place, and time. She appears well-developed and well-nourished.  HENT:  Head: Normocephalic and atraumatic.  Eyes: Pupils are equal, round, and reactive to light.  Neck: Normal range of motion. Neck supple.  Cardiovascular:  Normal rate, regular rhythm and normal heart sounds.   Pulmonary/Chest: Effort normal and breath sounds normal. No respiratory distress. She has no wheezes. She has no rales. She exhibits no tenderness.  Abdominal: Soft. Bowel sounds are normal. There is tenderness (Moderate tenderness to the right mid and lower abdomen). There is no  rebound and no guarding.  Musculoskeletal: Normal range of motion. She exhibits no edema.  Lymphadenopathy:    She has no cervical adenopathy.  Neurological: She is alert and oriented to person, place, and time.  Skin: Skin is warm and dry. No rash noted.  Psychiatric: She has a normal mood and affect.    ED Course  Procedures (including critical care time) Labs Review Labs Reviewed  CBC WITH DIFFERENTIAL - Abnormal; Notable for the following:    WBC 13.9 (*)    RBC 3.18 (*)    Hemoglobin 9.4 (*)    HCT 29.0 (*)    Platelets 575 (*)    Neutrophils Relative % 79 (*)    Neutro Abs 11.0 (*)    Lymphocytes Relative 10 (*)    Monocytes Absolute 1.4 (*)    All other components within normal limits  COMPREHENSIVE METABOLIC PANEL - Abnormal; Notable for the following:    Sodium 134 (*)    Potassium 3.6 (*)    Glucose, Bld 138 (*)    Albumin 2.4 (*)    AST 40 (*)    Alkaline Phosphatase 143 (*)    GFR calc non Af Amer 64 (*)    GFR calc Af Amer 74 (*)    All other components within normal limits  URINALYSIS, ROUTINE W REFLEX MICROSCOPIC - Abnormal; Notable for the following:    Color, Urine ORANGE (*)    APPearance CLOUDY (*)    Specific Gravity, Urine 1.031 (*)    Bilirubin Urine MODERATE (*)    Protein, ur 30 (*)    Urobilinogen, UA 2.0 (*)    Leukocytes, UA MODERATE (*)    All other components within normal limits  URINE MICROSCOPIC-ADD ON - Abnormal; Notable for the following:    Squamous Epithelial / LPF MANY (*)    Crystals CA OXALATE CRYSTALS (*)    All other components within normal limits  LIPASE, BLOOD  OCCULT BLOOD X 1 CARD TO LAB,  STOOL  OCCULT BLOOD, POC DEVICE   Imaging Review Ct Abdomen Pelvis W Contrast  08/29/2013   CLINICAL DATA:  Ovarian carcinoma, right-sided abdominal pain. Multiple bowel surgeries. Who  EXAM: CT ABDOMEN AND PELVIS WITH CONTRAST  TECHNIQUE: Multidetector CT imaging of the abdomen and pelvis was performed using the standard protocol following bolus administration of intravenous contrast.  CONTRAST:  74mL OMNIPAQUE IOHEXOL 300 MG/ML SOLN, 162mL OMNIPAQUE IOHEXOL 300 MG/ML SOLN  COMPARISON:  DG ABDOMEN 2V dated 08/28/2013; CT ABD/PELVIS W CM dated 03/16/2013; CT ABD/PELVIS W CM dated 09/23/2012  FINDINGS: New lung bases are clear.  No pericardial fluid.  No focal hepatic lesion. The gallbladder, pancreas, spleen, adrenal glands, and kidneys are normal.  The stomach and proximal small bowel are normal. Oral contrast flows into the mid small bowel without evidence obstruction or dilatation of the bowel.  Within the right lower quadrant there is a inflammatory process adjacent to the enteric colonic anastomoses. This is increased in volume compared to prior. For example previously there was a small round 2.9 x 2.1 cm enhancing fluid collection where on today's scan there is an approximately 5 .3 x 4.2 cm enhancing collection. This is concerning for ovarian cancer recurrence. Medially along the right lateral ventral of the peritoneal surface is a collection of gas which appears to contained within a lumen 4.2 x 2.2 cm. There is small amount of material within the dependent portion of this gas collection which may represent a enteric contents. There several other  small fluid collections which are difficult to tell where within the bowel or extra luminal. .  There is fluid within the transverse and proximal descending colon. Small amount stool in the descending colon. The distal descending colon rectosigmoid colon are collapsed. Contrast does not flow passed the mid small bowel.  Abdominal or is normal caliber. No  retroperitoneal periportal lymphadenopathy. No free fluid the pelvis. The bladder is normal. Post hysterectomy anatomy.  IMPRESSION: 1. Enlarging thick-walled enhancing process in the right lower quadrant is concerning for ovarian cancer recurrence. These process is immediately adjacent to the enteric colonic anastomoses. 2. Gas collection adjacent to this enlarging enhancing lesion is felt to be redundant postsurgical bowel however cannot exclude abscess. Delayed imaging through the abdomen could indicate if this gas collection communicate with the bowel. 3. No evidence of high-grade obstruction at the proximal small bowel is non dilated. There is minimal stool in the colon.   Electronically Signed   By: Suzy Bouchard M.D.   On: 08/29/2013 22:56    EKG Interpretation   None       MDM   Final diagnoses:  Abdominal mass    Patient history of past ovarian colon cancer presents with right-sided abdominal pain. 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 There is an adjacent fluid collection which is likely bowel but an abscess cannot be excluded. I did discuss the patient with the gyn onc fellow on-call at [facility] where the patient's gyn onc MD, Dr Skeet Latch is located. I also talked to the oncologist on call here at [facility], Dr. Neil Crouch and given that we cannot exclude an abscess adjacent to the mass we will go ahead and admit the patient to the hospitalist service with oncology consultation.  Pt was started on abx.    Malvin Johns, MD 08/30/13 [phone]

## 2013-08-29 NOTE — ED Notes (Signed)
Pt states she has been having lower right abd pain  Pt states she was seen by her dr on Monday and was told to go on a liquid diet and was told he could feel some kind of an obstruction  Pt states she has been on the liquids and continues to have pain  Pt has not had a bowel movement since last week

## 2013-08-29 NOTE — ED Notes (Signed)
MD at bedside. Dr. Belfi at bedside.  

## 2013-08-30 ENCOUNTER — Encounter (HOSPITAL_COMMUNITY): Payer: Self-pay

## 2013-08-30 DIAGNOSIS — R1903 Right lower quadrant abdominal swelling, mass and lump: Secondary | ICD-10-CM

## 2013-08-30 DIAGNOSIS — C182 Malignant neoplasm of ascending colon: Secondary | ICD-10-CM

## 2013-08-30 DIAGNOSIS — E119 Type 2 diabetes mellitus without complications: Secondary | ICD-10-CM

## 2013-08-30 DIAGNOSIS — D500 Iron deficiency anemia secondary to blood loss (chronic): Secondary | ICD-10-CM

## 2013-08-30 DIAGNOSIS — R109 Unspecified abdominal pain: Secondary | ICD-10-CM | POA: Diagnosis present

## 2013-08-30 DIAGNOSIS — R1900 Intra-abdominal and pelvic swelling, mass and lump, unspecified site: Secondary | ICD-10-CM | POA: Diagnosis present

## 2013-08-30 DIAGNOSIS — R1031 Right lower quadrant pain: Secondary | ICD-10-CM | POA: Diagnosis present

## 2013-08-30 DIAGNOSIS — K5900 Constipation, unspecified: Secondary | ICD-10-CM

## 2013-08-30 LAB — GLUCOSE, CAPILLARY
GLUCOSE-CAPILLARY: 185 mg/dL — AB (ref 70–99)
Glucose-Capillary: 159 mg/dL — ABNORMAL HIGH (ref 70–99)
Glucose-Capillary: 176 mg/dL — ABNORMAL HIGH (ref 70–99)

## 2013-08-30 LAB — CBC
HEMATOCRIT: 27.2 % — AB (ref 36.0–46.0)
Hemoglobin: 8.7 g/dL — ABNORMAL LOW (ref 12.0–15.0)
MCH: 29.2 pg (ref 26.0–34.0)
MCHC: 32 g/dL (ref 30.0–36.0)
MCV: 91.3 fL (ref 78.0–100.0)
PLATELETS: 486 10*3/uL — AB (ref 150–400)
RBC: 2.98 MIL/uL — ABNORMAL LOW (ref 3.87–5.11)
RDW: 13.6 % (ref 11.5–15.5)
WBC: 10.5 10*3/uL (ref 4.0–10.5)

## 2013-08-30 LAB — BASIC METABOLIC PANEL
BUN: 13 mg/dL (ref 6–23)
CALCIUM: 8.9 mg/dL (ref 8.4–10.5)
CHLORIDE: 101 meq/L (ref 96–112)
CO2: 23 mEq/L (ref 19–32)
CREATININE: 0.77 mg/dL (ref 0.50–1.10)
GFR calc Af Amer: >90 mL/min (ref >90)
GFR calc non Af Amer: 84 mL/min — ABNORMAL LOW (ref >90)
Glucose, Bld: 268 mg/dL — ABNORMAL HIGH (ref 70–99)
Potassium: 4 mEq/L (ref 3.7–5.3)
Sodium: 135 mEq/L — ABNORMAL LOW (ref 137–147)

## 2013-08-30 MED ORDER — INSULIN ASPART 100 UNIT/ML ~~LOC~~ SOLN
0.0000 [IU] | Freq: Three times a day (TID) | SUBCUTANEOUS | Status: DC
  Administered 2013-08-30 (×3): 3 [IU] via SUBCUTANEOUS
  Administered 2013-08-31: 2 [IU] via SUBCUTANEOUS

## 2013-08-30 MED ORDER — ONDANSETRON HCL 4 MG PO TABS
4.0000 mg | ORAL_TABLET | Freq: Four times a day (QID) | ORAL | Status: DC | PRN
  Administered 2013-08-31: 4 mg via ORAL
  Filled 2013-08-30: qty 1

## 2013-08-30 MED ORDER — SODIUM CHLORIDE 0.9 % IV SOLN: INTRAVENOUS | Status: DC

## 2013-08-30 MED ORDER — CIPROFLOXACIN IN D5W 400 MG/200ML IV SOLN
400.0000 mg | Freq: Two times a day (BID) | INTRAVENOUS | Status: DC
  Administered 2013-08-30 – 2013-08-31 (×3): 400 mg via INTRAVENOUS
  Filled 2013-08-30 (×4): qty 200

## 2013-08-30 MED ORDER — DOCUSATE SODIUM 100 MG PO CAPS
100.0000 mg | ORAL_CAPSULE | Freq: Two times a day (BID) | ORAL | Status: DC
  Administered 2013-08-30 – 2013-08-31 (×2): 100 mg via ORAL
  Filled 2013-08-30 (×4): qty 1

## 2013-08-30 MED ORDER — CIPROFLOXACIN IN D5W 400 MG/200ML IV SOLN
400.0000 mg | Freq: Once | INTRAVENOUS | Status: AC
  Administered 2013-08-30: 400 mg via INTRAVENOUS
  Filled 2013-08-30 (×2): qty 200

## 2013-08-30 MED ORDER — HYDROMORPHONE HCL PF 1 MG/ML IJ SOLN
0.5000 mg | INTRAMUSCULAR | Status: AC | PRN
  Administered 2013-08-30: 0.5 mg via INTRAVENOUS
  Filled 2013-08-30: qty 1

## 2013-08-30 MED ORDER — POTASSIUM CHLORIDE CRYS ER 20 MEQ PO TBCR
40.0000 meq | EXTENDED_RELEASE_TABLET | Freq: Once | ORAL | Status: AC
  Administered 2013-08-30: 40 meq via ORAL
  Filled 2013-08-30: qty 2

## 2013-08-30 MED ORDER — METRONIDAZOLE IN NACL 5-0.79 MG/ML-% IV SOLN
500.0000 mg | Freq: Once | INTRAVENOUS | Status: AC
  Administered 2013-08-30: 500 mg via INTRAVENOUS
  Filled 2013-08-30: qty 100

## 2013-08-30 MED ORDER — ONDANSETRON HCL 4 MG/2ML IJ SOLN
4.0000 mg | Freq: Four times a day (QID) | INTRAMUSCULAR | Status: DC | PRN
  Administered 2013-08-30 – 2013-08-31 (×2): 4 mg via INTRAVENOUS
  Filled 2013-08-30 (×2): qty 2

## 2013-08-30 MED ORDER — LORAZEPAM 0.5 MG PO TABS
0.5000 mg | ORAL_TABLET | Freq: Four times a day (QID) | ORAL | Status: DC | PRN
  Administered 2013-08-31: 0.5 mg via ORAL
  Filled 2013-08-30: qty 1

## 2013-08-30 MED ORDER — ACETAMINOPHEN 325 MG PO TABS: 650.0000 mg | ORAL_TABLET | Freq: Four times a day (QID) | ORAL | Status: DC | PRN

## 2013-08-30 MED ORDER — ONDANSETRON HCL 4 MG/2ML IJ SOLN: 4.0000 mg | Freq: Three times a day (TID) | INTRAMUSCULAR | Status: AC | PRN

## 2013-08-30 MED ORDER — HYDROCODONE-ACETAMINOPHEN 5-325 MG PO TABS
1.0000 | ORAL_TABLET | ORAL | Status: DC | PRN
  Administered 2013-08-30 – 2013-08-31 (×7): 1 via ORAL
  Filled 2013-08-30 (×7): qty 1

## 2013-08-30 MED ORDER — ENOXAPARIN SODIUM 40 MG/0.4ML ~~LOC~~ SOLN
40.0000 mg | SUBCUTANEOUS | Status: DC
  Administered 2013-08-30 – 2013-08-31 (×2): 40 mg via SUBCUTANEOUS
  Filled 2013-08-30 (×3): qty 0.4

## 2013-08-30 MED ORDER — ACETAMINOPHEN 650 MG RE SUPP: 650.0000 mg | Freq: Four times a day (QID) | RECTAL | Status: DC | PRN

## 2013-08-30 MED ORDER — METRONIDAZOLE IN NACL 5-0.79 MG/ML-% IV SOLN
500.0000 mg | Freq: Three times a day (TID) | INTRAVENOUS | Status: DC
  Administered 2013-08-30 – 2013-08-31 (×4): 500 mg via INTRAVENOUS
  Filled 2013-08-30 (×5): qty 100

## 2013-08-30 MED ORDER — BOOST / RESOURCE BREEZE PO LIQD
1.0000 | Freq: Two times a day (BID) | ORAL | Status: DC
  Administered 2013-08-30 – 2013-08-31 (×3): 1 via ORAL

## 2013-08-30 MED ORDER — ASPIRIN 81 MG PO CHEW
81.0000 mg | CHEWABLE_TABLET | Freq: Every morning | ORAL | Status: DC
  Administered 2013-08-30: 81 mg via ORAL
  Filled 2013-08-30 (×2): qty 1

## 2013-08-30 MED ORDER — SODIUM CHLORIDE 0.9 % IJ SOLN
10.0000 mL | INTRAMUSCULAR | Status: DC | PRN
  Administered 2013-08-31 (×2): 10 mL

## 2013-08-30 MED ORDER — MORPHINE SULFATE 2 MG/ML IJ SOLN
1.0000 mg | INTRAMUSCULAR | Status: DC | PRN
  Administered 2013-08-30: 1 mg via INTRAVENOUS
  Filled 2013-08-30: qty 1

## 2013-08-30 MED ORDER — POLYETHYLENE GLYCOL 3350 17 G PO PACK
17.0000 g | PACK | Freq: Every day | ORAL | Status: DC
  Administered 2013-08-31: 17 g via ORAL
  Filled 2013-08-30 (×2): qty 1

## 2013-08-30 MED ORDER — EZETIMIBE 10 MG PO TABS
10.0000 mg | ORAL_TABLET | Freq: Every morning | ORAL | Status: DC
  Administered 2013-08-30 – 2013-08-31 (×2): 10 mg via ORAL
  Filled 2013-08-30 (×3): qty 1

## 2013-08-30 MED ORDER — ATORVASTATIN CALCIUM 40 MG PO TABS
40.0000 mg | ORAL_TABLET | Freq: Every day | ORAL | Status: DC
  Administered 2013-08-30: 40 mg via ORAL
  Filled 2013-08-30 (×2): qty 1

## 2013-08-30 NOTE — Progress Notes (Signed)
Dr. Skeet Latch aware of patient's admission.  She plans to see the patient tomorrow since she is at [facility] today.  She is agreeable with having IR evaluate the patient for biopsy or drainage.  Patient informed of plan this am.

## 2013-08-30 NOTE — Progress Notes (Signed)
INITIAL NUTRITION ASSESSMENT  DOCUMENTATION CODES Per approved criteria  -Not Applicable   INTERVENTION: - Diet advancement per MD - Resource Breeze BID - Will continue to monitor   NUTRITION DIAGNOSIS: Inadequate oral intake related to clear liquid diet as evidenced by diet order.   Goal: Advance diet as tolerated to diabetic diet  Monitor:  Weights, labs, diet advancement  Reason for Assessment: Malnutrition screening tool   68 y.o. female  Admitting Dx: Right lower quadrant abdominal pain  ASSESSMENT: Pt presented to the ED with right lower quadrant abdominal pain for past 3 days. Patient has history of adenocarcinoma of ascending colon and carcinoma of the bilateral ovaries status post debulking of the ovarian mass and right hemicolectomy. Last chemotherapy in April of 2014. Patient reports having sharp pain over the right lower quadrant which is nonradiating and occurs several times a day for last 3 days without any aggravating or relieving factors. The pain is 7/10 in severity and associated with nausea but no vomiting. She also reports not having a bowel movement in last 5 days. She reports poor appetite and also about a 5 pound weight loss in past 6-8 weeks.  Met with pt who reports not eating any solid food since Saturday PTA and has been having nausea for the past week. States she has been consuming chicken broth, jello, and coffee. Reports before then she had a good appetite and was eating 2-3 meals/day. Reports blood sugars have been up and down recently. Denies any abdominal pain today and reports eating 50% of clear liquid breakfast this morning.   Sodium low Potassium low, getting oral replacement Alk phos and AST elevated   Nutrition Focused Physical Exam:  Subcutaneous Fat:  Orbital Region: WNL Upper Arm Region: WNL Thoracic and Lumbar Region: NA  Muscle:  Temple Region: WNL Clavicle Bone Region: WNL Clavicle and Acromion Bone Region: WNL Scapular Bone  Region: NA Dorsal Hand: WNL Patellar Region: WNL Anterior Thigh Region: WNL Posterior Calf Region: WNL  Edema: None noted   Height: Ht Readings from Last 1 Encounters:  08/30/13 '5\' 3"'  (1.6 m)    Weight: Wt Readings from Last 1 Encounters:  08/30/13 149 lb (67.586 kg)    Ideal Body Weight: 115 lb   % Ideal Body Weight: 129%  Wt Readings from Last 10 Encounters:  08/30/13 149 lb (67.586 kg)  07/24/13 162 lb (73.483 kg)  07/18/13 162 lb (73.483 kg)  06/28/13 161 lb 14.4 oz (73.437 kg)  06/27/13 162 lb 1.6 oz (73.528 kg)  03/30/13 162 lb 9.6 oz (73.755 kg)  03/07/13 162 lb 4.8 oz (73.619 kg)  12/27/12 153 lb 3.2 oz (69.491 kg)  11/29/12 152 lb 4.8 oz (69.083 kg)  11/08/12 146 lb (66.225 kg)    Usual Body Weight: 154 lb   % Usual Body Weight: 97%  BMI:  Body mass index is 26.4 kg/(m^2).  Estimated Nutritional Needs: Kcal: 1400-1600 Protein: 65-75g Fluid: 1.4-1.6L/day  Skin: Intact   Diet Order: Clear Liquid  EDUCATION NEEDS: -No education needs identified at this time   Intake/Output Summary (Last 24 hours) at 08/30/13 1109 Last data filed at 08/30/13 1100  Gross per 24 hour  Intake    685 ml  Output    350 ml  Net    335 ml    Last BM: 2/18  Labs:   Recent Labs Lab 08/29/13 2122  NA 134*  K 3.6*  CL 97  CO2 24  BUN 17  CREATININE 0.90  CALCIUM  9.1  GLUCOSE 138*    CBG (last 3)   Recent Labs  08/30/13 0709  GLUCAP 159*    Scheduled Meds: . atorvastatin  40 mg Oral QHS  . ciprofloxacin  400 mg Intravenous Q12H  . docusate sodium  100 mg Oral BID  . enoxaparin (LOVENOX) injection  40 mg Subcutaneous Q24H  . ezetimibe  10 mg Oral q morning - 10a  . insulin aspart  0-15 Units Subcutaneous TID WC  . metronidazole  500 mg Intravenous Q8H  . polyethylene glycol  17 g Oral Daily    Continuous Infusions: . sodium chloride 125 mL/hr at 08/29/13 2148  . sodium chloride      Past Medical History  Diagnosis Date  . Diabetes  mellitus   . Hyperlipidemia   . Hypertension     no current meds for since aug 2013  . Anemia   . Ovarian cancer 2013  . Colon cancer 2013    spread from her ovarian cancer  . Blood transfusion without reported diagnosis   . HCAP (healthcare-associated pneumonia) 08/22/2012  . Shortness of breath     Past Surgical History  Procedure Laterality Date  . Wisdom tooth extraction    . Laparotomy  07/26/2012    Procedure: EXPLORATORY LAPAROTOMY;  Surgeon: Janie Morning, MD PHD;  Location: [facility];  Service: Gynecology;  Laterality: N/A;  Exploratory Laporatomy/Total Abdominal Hysterectomy Bilatderal Salpingo Oopherectomy/Tumor Debulking  . Abdominal hysterectomy  07/26/2012    Procedure: HYSTERECTOMY ABDOMINAL;  Surgeon: Janie Morning, MD PHD;  Location: [facility];  Service: Gynecology;;  . Salpingoophorectomy  07/26/2012    Procedure: SALPINGO OOPHORECTOMY;  Surgeon: Janie Morning, MD PHD;  Location: [facility];  Service: Gynecology;  Laterality: Bilateral;  . Omentectomy  07/26/2012    Procedure: OMENTECTOMY;  Surgeon: Janie Morning, MD PHD;  Location: [facility];  Service: Gynecology;;  . Colostomy revision  07/26/2012    Procedure: COLON RESECTION RIGHT;  Surgeon: Janie Morning, MD PHD;  Location: [facility];  Service: Gynecology;;  with reanastomsis  . Portacath placement  2013  . Colon surgery      Mikey College MS, [place], [place] Pager [phone] After Hours Pager

## 2013-08-30 NOTE — H&P (Signed)
[facility] History and Physical  Ann Baldwin [id number] DOB: 01/15/1946 DOA: 08/29/2013  Referring physician: Dr. Tamera Punt PCP: [name], Ann Brandy, MD   Chief Complaint: Right lower quadrant abdominal pain since 3 days   HPI:  68 year old female presented to the ED with right lower quadrant abdominal pain for past 3 days. Patient has history of adenocarcinoma of ascending colon and carcinoma of the bilateral ovaries status post debulking of the ovarian mass and right hemicolectomy. Last chemotherapy in April of 2014. Patient follows with Dr. Marko Baldwin and Dr. Skeet Baldwin her gyn surgeon. Patient recently had a followup colonoscopy in one month back which showed no signs of malignancy. ( Follows with Dr. Carlean Baldwin) patient reports having sharp pain over the right lower quadrant which is nonradiating and occurs several times a day for last 3 days without any aggravating or relieving factors. The pain is 7/10 in severity and associated with nausea but no vomiting. She also reports not having a bowel movement in last 5 days. She reports poor appetite and also about a 5 pound weight loss in past 6-8 weeks. Patient denies headache, dizziness, fever, chills, vomiting, chest pain, palpitations, SOB, abdominal pain, bowel or urinary symptoms.    course in the ED Patient had a low-grade temperature of 99.25F, mildly tachycardic, normal respiratory rate and blood pressure.  Blood work done showed leukocytosis with WBC of 13.9k, hb of  9.4, hematocrit of 29 and platelet of 575 Chemistry showed sodium of 134, potassium 3.6 and glucose of 138.  A CT scan of the abdomen and pelvis done showed findings as outlined below. Patient given a dose of IV ciprofloxacin and Flagyl in the ED followed by 4 mg of IV morphine and 4 mg IV Zofran. Patient started on IV normal saline. Given the CT findings. Patient's GYN surgeon Dr. Skeet Baldwin was contacted and the ED physician spoke with the fellow on call who informed to  notify Dr. Skeet Baldwin in the morning. Oncology on call Dr. Julien Baldwin was consulted by ED physician who recommended admission to medical floor and formal oncology consult in the morning.  Review of Systems:  Constitutional: Denies fever, chills, diaphoresis, appetite change, weight loss and fatigue.  HEENT: Denies photophobia, eye pain,  congestion, sore throat, rhinorrhea, sneezing, mouth sores, trouble swallowing, neck pain, neck stiffness and tinnitus.   Respiratory: Denies SOB, DOE, cough, chest tightness,  and wheezing.   Cardiovascular: Denies chest pain, palpitations and leg swelling.  Gastrointestinal:  abdominal pain, nausea, constipation Denies vomiting,  diarrhea,  blood in stool and abdominal distention.  Genitourinary: Denies dysuria, urgency, frequency, hematuria, flank pain and difficulty urinating.  Endocrine: Denies hot or cold intolerance, polyuria, polydipsia. Musculoskeletal: Denies myalgias, back pain, joint swelling, arthralgias and gait problem.  Skin: Denies pallor, rash and wound.  Neurological: Denies dizziness, seizures, syncope, weakness, light-headedness, numbness and headaches.  Psychiatric/Behavioral: Denies  confusion, nervousness, sleep disturbance and agitation   Past Medical History  Diagnosis Date  . Diabetes mellitus   . Hyperlipidemia   . Hypertension     no current meds for since aug 2013  . Anemia   . Ovarian cancer 2013  . Colon cancer 2013    spread from her ovarian cancer  . Blood transfusion without reported diagnosis   . HCAP (healthcare-associated pneumonia) 08/22/2012   Past Surgical History  Procedure Laterality Date  . Wisdom tooth extraction    . Laparotomy  07/26/2012    Procedure: EXPLORATORY LAPAROTOMY;  Surgeon: Janie Morning, MD PHD;  Location: [facility];  Service: Gynecology;  Laterality: N/A;  Exploratory Laporatomy/Total Abdominal Hysterectomy Bilatderal Salpingo Oopherectomy/Tumor Debulking  . Abdominal hysterectomy  07/26/2012     Procedure: HYSTERECTOMY ABDOMINAL;  Surgeon: Janie Morning, MD PHD;  Location: [facility];  Service: Gynecology;;  . Salpingoophorectomy  07/26/2012    Procedure: SALPINGO OOPHORECTOMY;  Surgeon: Janie Morning, MD PHD;  Location: [facility];  Service: Gynecology;  Laterality: Bilateral;  . Omentectomy  07/26/2012    Procedure: OMENTECTOMY;  Surgeon: Janie Morning, MD PHD;  Location: [facility];  Service: Gynecology;;  . Colostomy revision  07/26/2012    Procedure: COLON RESECTION RIGHT;  Surgeon: Janie Morning, MD PHD;  Location: [facility];  Service: Gynecology;;  with reanastomsis  . Portacath placement  2013   Social History:  reports that she has never smoked. She has never used smokeless tobacco. She reports that she does not drink alcohol or use illicit drugs.  No Known Allergies  Family History  Problem Relation Age of Onset  . Colon cancer Neg Hx   . Esophageal cancer Neg Hx   . Stomach cancer Neg Hx   . Rectal cancer Neg Hx   . Kidney cancer Maternal Uncle   . Uterine cancer Sister 57  . Prostate cancer Cousin     early 25s  . Lymphoma Cousin     Hodgkin's Lymphoma  . Lung cancer Cousin     Prior to Admission medications   Medication Sig Start Date End Date Taking? Authorizing Provider  aspirin 81 MG tablet Take 81 mg by mouth every morning.    Yes Historical Provider, MD  atorvastatin (LIPITOR) 40 MG tablet Take 40 mg by mouth at bedtime.    Yes Historical Provider, MD  docusate sodium (COLACE) 100 MG capsule Take 100 mg by mouth 2 (two) times daily.    Yes Historical Provider, MD  ezetimibe (ZETIA) 10 MG tablet Take 10 mg by mouth every morning.    Yes Historical Provider, MD  glimepiride (AMARYL) 1 MG tablet Take 1 mg by mouth daily with breakfast.   Yes Historical Provider, MD  insulin regular (NOVOLIN R,HUMULIN R) 100 units/mL injection Inject into the skin 3 (three) times daily before meals. Sliding scale only as needed for BS>200   Yes Historical Provider, MD   lidocaine-prilocaine (EMLA) cream Apply to [facility] Cath 1- 2 hours prior to access 09/21/12  Yes Lennis P Livesay, MD  LORazepam (ATIVAN) 0.5 MG tablet TAKE ONE TAB EVERY SIX HOURS AS NEEDED FOR NAUSEA, ANXIETY, OR SLEEP. 08/02/13  Yes Lennis P Livesay, MD  ondansetron (ZOFRAN) 8 MG tablet TAKE ONE TABLET BY MOUTH EVERY EIGHT HOURS AS NEEDED FOR NAUSEA 06/14/13  Yes Lennis Marion Downer, MD  polyethylene glycol (MIRALAX / GLYCOLAX) packet Take 17 g by mouth 2 (two) times daily as needed. constipation   Yes Historical Provider, MD  traMADol (ULTRAM) 50 MG tablet Take 1 tablet (50 mg total) by mouth every 6 (six) hours as needed. 06/27/13  Yes Janie Morning, MD     Physical Exam:  Filed Vitals:   08/29/13 2004 08/29/13 2347  BP: 136/82 136/59  Pulse: 111 99  Temp: 98.7 F (37.1 C) 99.2 F (37.3 C)  TempSrc: Oral Oral  Resp: 20 18  Height: 5\' 3"  (1.6 m)   Weight: 67.586 kg (149 lb)   SpO2: 96% 95%    Constitutional: Vital signs reviewed.  Patient is an alert female lying in bed in no acute distress HEENT: no pallor, no icterus, moist oral mucosa, no cervical  lymphadenopathy Cardiovascular: RRR, S1 normal, S2 normal, no MRG Chest: CTAB, no wheezes, rales, or rhonchi Abdominal: Soft. non-distended, bowel sounds are normal, tender to palpation over right lower quadrant, no masses, organomegaly, or guarding present. no CVA tenderness  Ext: warm, no edema Neurological: A&O x3, non focal  Labs on Admission:  Basic Metabolic Panel:  Recent Labs Lab 08/29/13 2122  NA 134*  K 3.6*  CL 97  CO2 24  GLUCOSE 138*  BUN 17  CREATININE 0.90  CALCIUM 9.1   Liver Function Tests:  Recent Labs Lab 08/29/13 2122  AST 40*  ALT 32  ALKPHOS 143*  BILITOT 0.5  PROT 6.5  ALBUMIN 2.4*    Recent Labs Lab 08/29/13 2122  LIPASE 11   No results found for this basename: AMMONIA,  in the last 168 hours CBC:  Recent Labs Lab 08/29/13 2122  WBC 13.9*  NEUTROABS 11.0*  HGB 9.4*  HCT  29.0*  MCV 91.2  PLT 575*   Cardiac Enzymes: No results found for this basename: CKTOTAL, CKMB, CKMBINDEX, TROPONINI,  in the last 168 hours BNP: No components found with this basename: POCBNP,  CBG: No results found for this basename: GLUCAP,  in the last 168 hours  Radiological Exams on Admission: Ct Abdomen Pelvis W Contrast  08/29/2013   CLINICAL DATA:  Ovarian carcinoma, right-sided abdominal pain. Multiple bowel surgeries. Who  EXAM: CT ABDOMEN AND PELVIS WITH CONTRAST  TECHNIQUE: Multidetector CT imaging of the abdomen and pelvis was performed using the standard protocol following bolus administration of intravenous contrast.  CONTRAST:  40mL OMNIPAQUE IOHEXOL 300 MG/ML SOLN, 136mL OMNIPAQUE IOHEXOL 300 MG/ML SOLN  COMPARISON:  DG ABDOMEN 2V dated 08/28/2013; CT ABD/PELVIS W CM dated 03/16/2013; CT ABD/PELVIS W CM dated 09/23/2012  FINDINGS: New lung bases are clear.  No pericardial fluid.  No focal hepatic lesion. The gallbladder, pancreas, spleen, adrenal glands, and kidneys are normal.  The stomach and proximal small bowel are normal. Oral contrast flows into the mid small bowel without evidence obstruction or dilatation of the bowel.  Within the right lower quadrant there is a inflammatory process adjacent to the enteric colonic anastomoses. This is increased in volume compared to prior. For example previously there was a small round 2.9 x 2.1 cm enhancing fluid collection where on today's scan there is an approximately 5 .3 x 4.2 cm enhancing collection. This is concerning for ovarian cancer recurrence. Medially along the right lateral ventral of the peritoneal surface is a collection of gas which appears to contained within a lumen 4.2 x 2.2 cm. There is small amount of material within the dependent portion of this gas collection which may represent a enteric contents. There several other small fluid collections which are difficult to tell where within the bowel or extra luminal. .  There is  fluid within the transverse and proximal descending colon. Small amount stool in the descending colon. The distal descending colon rectosigmoid colon are collapsed. Contrast does not flow passed the mid small bowel.  Abdominal or is normal caliber. No retroperitoneal periportal lymphadenopathy. No free fluid the pelvis. The bladder is normal. Post hysterectomy anatomy.  IMPRESSION: 1. Enlarging thick-walled enhancing process in the right lower quadrant is concerning for ovarian cancer recurrence. These process is immediately adjacent to the enteric colonic anastomoses. 2. Gas collection adjacent to this enlarging enhancing lesion is felt to be redundant postsurgical bowel however cannot exclude abscess. Delayed imaging through the abdomen could indicate if this gas collection communicate with the  bowel. 3. No evidence of high-grade obstruction at the proximal small bowel is non dilated. There is minimal stool in the colon.   Electronically Signed   By: Suzy Bouchard M.D.   On: 08/29/2013 22:56      Assessment/Plan Principal problem Right lower quadrant abdominal pain Patient has history of adenocarcinoma of ascending colon and carcinoma of the bilateral ovaries status post debulking of the ovarian mass and right hemicolectomy. Last chemotherapy in April of 2014. Patient follows with Dr. Marko Baldwin and Dr. Skeet Baldwin her gyn surgeon. Patient recently had a followup colonoscopy in one month back which showed no signs of malignancy. ( Follows with Dr. Carlean Baldwin) CT scan of the abdomen shows enlarged thick walled enhancing process in the right lower quadrant concerning for recurrence awaiting cancer which is immediately adjacent to anterior colonic anastomosis. Also shows gas collection adjacent to the enlarging enhancing lesion which could be postsurgical bowel changes but cannot rule out abscess. -Patient will be admitted to medical floor.  -I would place her on empiric ciprofloxacin and Flagyl. Continue serial  abdominal exam. She will need formal consult by oncology and her GYN surgeon. Dr. Skeet Baldwin has been notified. Please inform her oncologist Dr. Marko Baldwin (I have added as a consult)  -A liquid diet.    ascending colon cancer and ovarian cancer  As outlined above  Anemia Workup in ED shows a drop in her hb by almost 2 g. Check stool for occult blood. Recent colonoscopy unremarkable. GI consult as needed   Hypokalemia replenish   Hyperlipidemia Continue statin and Zetia  Diabetes mellitus Hold glyburide.  monitor fsg. continue SSI  Diet: Clear liquids  DVT prophylaxis: sq lovenox   Code Status:full code Family Communication: discussed with daughter-in-law and granddaughter at bedside Disposition Plan: Currently inpatient  Louellen Molder [facility] Pager [phone]  Total time spent on admission :70 minutes  If 7PM-7AM, please contact night-coverage [email or web address] Password [email or web address] 08/30/2013, 1:08 AM

## 2013-08-30 NOTE — Progress Notes (Signed)
IR aware of consult request for possible aspiration/drain of RLQ process. Chart, past medical and surgical history reviewed. Images reviewed by Dr. Laurence Ferrari, very difficult to tell what is what in RLQ. Contrast had not yet reached bowel in RLQ including anastomosis on CT scan from last night. Needle aspiration/drainage would be high risk for injury to the bowel. Recommend IV abx as ordered and see if she responds to treatment. We will coordinate a repeat CT scan for tomorrow specifically timed to do scan when po contrast has reached RLQ/anastomosis. This will allow better visualization for what is bowel and what could potentially be abscess vs cancer recurrence. D/W Dr. Tyrell Antonio  Please call Dr. Laurence Ferrari with any questions.  Ascencion Dike PA-C Interventional Radiology 08/30/2013 11:29 AM

## 2013-08-30 NOTE — Progress Notes (Signed)
Medical Oncology  Reason for Consult: RLQ abdominal mass with history of colon carcinoma and ovarian carcinoma Referring Physician: hospitalist service Other MDs: W.Brewster, C.Carlean Purl, D.Newman/ E.Wilson, [employer] (PCP)  Ann Baldwin is an 68 y.o. female with complicated medical history including ovarian and colon cancers, admitted with recent constipation followed by several days of progessive RLQ pain. CT AP 08-29-13 showed increased inflammatory process adjacent to colonic anastomosis with fluid and gas.  ONCOLOGIC HISTORY Patient presented with GI symptoms in April 2013, with colonoscopy by Dr Carlean Purl 01-26-12, with large ulcer in ascending colon, pathology [phone])  adenocarcinoma consistent with noncolonic primary. CT AP  02-02-12 had complex left adnexal mass, right ovarian mass, omental caking and peritoneal carcinomatosis. She was seen by Dr Skeet Latch on 02-09-12, with CA 125 of 22,938 and CEA 0.5; CMET resulted after Dr Leone Brand visit had BUN 63 and creatinine 8.78. She was admitted to [facility] with acute renal failure felt multifactorial including N/V/diarrhea/ascites, NSAID and ACE inhibitors, lasix, IV contrast, HTN and DM. Renal function progressively improved. Single agent taxol began 02-24-12 with addition of carboplatin beginning cycle 3 chemotherapy. She had paracentesis of 5 liters on 02-11-12 and 2.4 liters on 02-18-12. CA 125, which was 22,938 at presentation, was down to 1835 by Sept 2013, 98 by Nov 2013 and 35 by 06-2012. She had total of 4 cycles with carbo + taxol thru 06-23-12. Surgery by Dr Skeet Latch  07-26-12 was complete debulking of left ovarian primary and also right hemicolectomy with reanastomosis and 2 lymph node evaluation for the unexpected finding of synchronous primary colon cancer. The colon cancer had involvement of 1 of 2 regional nodes without microsatellite instability. She had additional weekly taxol from 3-12 thru 11-02-12. Due to performance status and  overall situation, decision was made not to attempt additional adjuvant chemotherapy for the colon cancer. Follow up CT AP 03-2013 had question of anastomotic leak, followed with observation as patient was asymptomatic. She had colonoscopy by Dr Carlean Purl 07-24-13 with inflammation but no malignancy in biopsies of the anastomotic area. CA 125 on 08-23-13 was 16.6, having been 12.5 in Dec and 8.0 in Sept.  She has PAC.  HPI: Patient had recent constipation, then progressive RLQ abdominal pain for 5-6 days prior to being admitted thru ED on 08-29-13. CT AP 08-29-13 had clear lung bases, no hepatic lesions, inflammatory process adjacent to colonic anastomosis now 5.3 x 4.2 cm, with gas collection in lateral aspect and several other small fluid collections not clearly within or external to bowel. Contrast did not move past mid small bowel, no adenopathy.   ROS Some nausea, improved with antiemetics since admission. No shaking chills. No LE swelling or pain. No SOB. No mucositis  Allergies: No Known Allergies Past Medical History  Diagnosis Date  . Diabetes mellitus   . Hyperlipidemia   . Hypertension     no current meds for since aug 2013  . Anemia   . Ovarian cancer 2013  . Colon cancer 2013    spread from her ovarian cancer  . Blood transfusion without reported diagnosis   . HCAP (healthcare-associated pneumonia) 08/22/2012  . Shortness of breath    Negative genetics testing for hereditary ovarian cancer 02-2013 Past Surgical History  Procedure Laterality Date  . Wisdom tooth extraction    . Laparotomy  07/26/2012    Procedure: EXPLORATORY LAPAROTOMY;  Surgeon: Janie Morning, MD PHD;  Location: [facility];  Service: Gynecology;  Laterality: N/A;  Exploratory Laporatomy/Total Abdominal Hysterectomy Bilatderal Salpingo Oopherectomy/Tumor Debulking  .  Abdominal hysterectomy  07/26/2012    Procedure: HYSTERECTOMY ABDOMINAL;  Surgeon: Janie Morning, MD PHD;  Location: [facility];  Service: Gynecology;;  .  Salpingoophorectomy  07/26/2012    Procedure: SALPINGO OOPHORECTOMY;  Surgeon: Janie Morning, MD PHD;  Location: [facility];  Service: Gynecology;  Laterality: Bilateral;  . Omentectomy  07/26/2012    Procedure: OMENTECTOMY;  Surgeon: Janie Morning, MD PHD;  Location: [facility];  Service: Gynecology;;  . Colostomy revision  07/26/2012    Procedure: COLON RESECTION RIGHT;  Surgeon: Janie Morning, MD PHD;  Location: [facility];  Service: Gynecology;;  with reanastomsis  . Portacath placement  2013  . Colon surgery      Family History  Problem Relation Age of Onset  . Colon cancer Neg Hx   . Esophageal cancer Neg Hx   . Stomach cancer Neg Hx   . Rectal cancer Neg Hx   . Kidney cancer Maternal Uncle   . Uterine cancer Sister 42  . Prostate cancer Cousin     early 55s  . Lymphoma Cousin     Hodgkin's Lymphoma  . Lung cancer Cousin     Social History:  reports that she has never smoked. She has never used smokeless tobacco. She reports that she does not drink alcohol or use illicit drugs. DIvorced. Lives with family in [place] granddaughter very involved in care thru all of chemo. 2 sons, 2 grandchildren. Retired from work with furniture and sewing.  Medications: reviewed in EMR  Blood pressure 124/59, pulse 81, temperature 98.9 F (37.2 C), temperature source Oral, resp. rate 18, height '5\' 3"'  (1.6 m), weight 149 lb (67.586 kg), SpO2 95.00%. Physical Exam Awake, alert, NAD lying supine in bed on RA, IV infusing at 75 cc/hr via PAC.  PERRL, not icteric. Oral mucosa moist without lesions. No supraclavicular adenopathy. Heart RRR, no gallop. Lungs clear anteriorly and laterally. Abdomen soft, not distended, scattered bowel sounds without rushing, somewhat tender RLQ without mass appreciated. LE no edema, cords, tenderness. Neuro nonfocal. Psych: appropriate mood and affect, fully oriented, cooperative.    Results for orders placed during the hospital encounter of 08/29/13 (from the past 48  hour(s))  CBC WITH DIFFERENTIAL     Status: Abnormal   Collection Time    08/29/13  9:22 PM      Result Value Ref Range   WBC 13.9 (*) 4.0 - 10.5 K/uL   RBC 3.18 (*) 3.87 - 5.11 MIL/uL   Hemoglobin 9.4 (*) 12.0 - 15.0 g/dL   HCT 29.0 (*) 36.0 - 46.0 %   MCV 91.2  78.0 - 100.0 fL   MCH 29.6  26.0 - 34.0 pg   MCHC 32.4  30.0 - 36.0 g/dL   RDW 13.7  11.5 - 15.5 %   Platelets 575 (*) 150 - 400 K/uL   Neutrophils Relative % 79 (*) 43 - 77 %   Neutro Abs 11.0 (*) 1.7 - 7.7 K/uL   Lymphocytes Relative 10 (*) 12 - 46 %   Lymphs Abs 1.5  0.7 - 4.0 K/uL   Monocytes Relative 10  3 - 12 %   Monocytes Absolute 1.4 (*) 0.1 - 1.0 K/uL   Eosinophils Relative 0  0 - 5 %   Eosinophils Absolute 0.0  0.0 - 0.7 K/uL   Basophils Relative 0  0 - 1 %   Basophils Absolute 0.0  0.0 - 0.1 K/uL  COMPREHENSIVE METABOLIC PANEL     Status: Abnormal   Collection Time  08/29/13  9:22 PM      Result Value Ref Range   Sodium 134 (*) 137 - 147 mEq/L   Potassium 3.6 (*) 3.7 - 5.3 mEq/L   Chloride 97  96 - 112 mEq/L   CO2 24  19 - 32 mEq/L   Glucose, Bld 138 (*) 70 - 99 mg/dL   BUN 17  6 - 23 mg/dL   Creatinine, Ser 0.90  0.50 - 1.10 mg/dL   Calcium 9.1  8.4 - 10.5 mg/dL   Total Protein 6.5  6.0 - 8.3 g/dL   Albumin 2.4 (*) 3.5 - 5.2 g/dL   AST 40 (*) 0 - 37 U/L   ALT 32  0 - 35 U/L   Alkaline Phosphatase 143 (*) 39 - 117 U/L   Total Bilirubin 0.5  0.3 - 1.2 mg/dL   GFR calc non Af Amer 64 (*) >90 mL/min   GFR calc Af Amer 74 (*) >90 mL/min   Comment: (NOTE)     The eGFR has been calculated using the CKD EPI equation.     This calculation has not been validated in all clinical situations.     eGFR's persistently <90 mL/min signify possible Chronic Kidney     Disease.  LIPASE, BLOOD     Status: None   Collection Time    08/29/13  9:22 PM      Result Value Ref Range   Lipase 11  11 - 59 U/L  URINALYSIS, ROUTINE W REFLEX MICROSCOPIC     Status: Abnormal   Collection Time    08/29/13  9:34 PM       Result Value Ref Range   Color, Urine ORANGE (*) YELLOW   Comment: BIOCHEMICALS MAY BE AFFECTED BY COLOR   APPearance CLOUDY (*) CLEAR   Specific Gravity, Urine 1.031 (*) 1.005 - 1.030   pH 5.5  5.0 - 8.0   Glucose, UA NEGATIVE  NEGATIVE mg/dL   Hgb urine dipstick NEGATIVE  NEGATIVE   Bilirubin Urine MODERATE (*) NEGATIVE   Ketones, ur NEGATIVE  NEGATIVE mg/dL   Protein, ur 30 (*) NEGATIVE mg/dL   Urobilinogen, UA 2.0 (*) 0.0 - 1.0 mg/dL   Nitrite NEGATIVE  NEGATIVE   Leukocytes, UA MODERATE (*) NEGATIVE  URINE MICROSCOPIC-ADD ON     Status: Abnormal   Collection Time    08/29/13  9:34 PM      Result Value Ref Range   Squamous Epithelial / LPF MANY (*) RARE   WBC, UA 21-50  <3 WBC/hpf   RBC / HPF 0-2  <3 RBC/hpf   Crystals CA OXALATE CRYSTALS (*) NEGATIVE   Urine-Other MUCOUS PRESENT    OCCULT BLOOD, POC DEVICE     Status: None   Collection Time    08/29/13 10:45 PM      Result Value Ref Range   Fecal Occult Bld NEGATIVE  NEGATIVE  GLUCOSE, CAPILLARY     Status: Abnormal   Collection Time    08/30/13  7:09 AM      Result Value Ref Range   Glucose-Capillary 159 (*) 70 - 99 mg/dL   Comment 1 Notify RN     Comment 2 Documented in Chart    CBC     Status: Abnormal   Collection Time    08/30/13  7:42 AM      Result Value Ref Range   WBC 10.5  4.0 - 10.5 K/uL   RBC 2.98 (*) 3.87 - 5.11 MIL/uL   Hemoglobin  8.7 (*) 12.0 - 15.0 g/dL   HCT 27.2 (*) 36.0 - 46.0 %   MCV 91.3  78.0 - 100.0 fL   MCH 29.2  26.0 - 34.0 pg   MCHC 32.0  30.0 - 36.0 g/dL   RDW 13.6  11.5 - 15.5 %   Platelets 486 (*) 150 - 400 K/uL  GLUCOSE, CAPILLARY     Status: Abnormal   Collection Time    08/30/13 11:36 AM      Result Value Ref Range   Glucose-Capillary 185 (*) 70 - 99 mg/dL   Comment 1 Notify RN     Comment 2 Documented in Chart    BASIC METABOLIC PANEL     Status: Abnormal   Collection Time    08/30/13 12:00 PM      Result Value Ref Range   Sodium 135 (*) 137 - 147 mEq/L   Potassium 4.0   3.7 - 5.3 mEq/L   Chloride 101  96 - 112 mEq/L   CO2 23  19 - 32 mEq/L   Glucose, Bld 268 (*) 70 - 99 mg/dL   BUN 13  6 - 23 mg/dL   Creatinine, Ser 0.77  0.50 - 1.10 mg/dL   Calcium 8.9  8.4 - 10.5 mg/dL   GFR calc non Af Amer 84 (*) >90 mL/min   GFR calc Af Amer >90  >90 mL/min   Comment: (NOTE)     The eGFR has been calculated using the CKD EPI equation.     This calculation has not been validated in all clinical situations.     eGFR's persistently <90 mL/min signify possible Chronic Kidney     Disease.   Iron, TIBC, ferritin and CEA added to AM labs tomorrow      Ct Abdomen Pelvis W Contrast  08/29/2013   CLINICAL DATA:  Ovarian carcinoma, right-sided abdominal pain. Multiple bowel surgeries. Who  EXAM: CT ABDOMEN AND PELVIS WITH CONTRAST  TECHNIQUE: Multidetector CT imaging of the abdomen and pelvis was performed using the standard protocol following bolus administration of intravenous contrast.  CONTRAST:  19m OMNIPAQUE IOHEXOL 300 MG/ML SOLN, 1059mOMNIPAQUE IOHEXOL 300 MG/ML SOLN  COMPARISON:  DG ABDOMEN 2V dated 08/28/2013; CT ABD/PELVIS W CM dated 03/16/2013; CT ABD/PELVIS W CM dated 09/23/2012  FINDINGS: New lung bases are clear.  No pericardial fluid.  No focal hepatic lesion. The gallbladder, pancreas, spleen, adrenal glands, and kidneys are normal.  The stomach and proximal small bowel are normal. Oral contrast flows into the mid small bowel without evidence obstruction or dilatation of the bowel.  Within the right lower quadrant there is a inflammatory process adjacent to the enteric colonic anastomoses. This is increased in volume compared to prior. For example previously there was a small round 2.9 x 2.1 cm enhancing fluid collection where on today's scan there is an approximately 5 .3 x 4.2 cm enhancing collection. This is concerning for ovarian cancer recurrence. Medially along the right lateral ventral of the peritoneal surface is a collection of gas which appears to contained  within a lumen 4.2 x 2.2 cm. There is small amount of material within the dependent portion of this gas collection which may represent a enteric contents. There several other small fluid collections which are difficult to tell where within the bowel or extra luminal. .  There is fluid within the transverse and proximal descending colon. Small amount stool in the descending colon. The distal descending colon rectosigmoid colon are collapsed. Contrast does not flow  passed the mid small bowel.  Abdominal or is normal caliber. No retroperitoneal periportal lymphadenopathy. No free fluid the pelvis. The bladder is normal. Post hysterectomy anatomy.  IMPRESSION: 1. Enlarging thick-walled enhancing process in the right lower quadrant is concerning for ovarian cancer recurrence. These process is immediately adjacent to the enteric colonic anastomoses. 2. Gas collection adjacent to this enlarging enhancing lesion is felt to be redundant postsurgical bowel however cannot exclude abscess. Delayed imaging through the abdomen could indicate if this gas collection communicate with the bowel. 3. No evidence of high-grade obstruction at the proximal small bowel is non dilated. There is minimal stool in the colon.   Electronically Signed   By: Suzy Bouchard M.D.   On: 08/29/2013 22:56        Assessment/Plan: 1.RLQ mass with pain and possible partial bowel obstruction, in patient with high grade ovarian cancer on observation since chemotherapy completed 11-02-12, and T4aN1 adenocarcinoma of ascending colon (only 2 nodes evaluated at this unexpected finding during surgery for the ovarian cancer 07-2012, no additional therapy). Note colonoscopy Jan 2015 without malignancy. Dr Skeet Latch aware and will see her on 2-19; interventional radiology also evaluating.  2.diabetes on insulin 3.PAC in 4.acute renal failure just after diagnosis of gyn cancer 2013 5.anemia: multifactorial including iron deficiency from blood loss (post  IV iron March 2014), chemo, chronic disease. Down from 11.6 in Dec and 12.7 in Sept. Repeat CBC in AM + iron studies  Please call if our service needed between rounds.  LIVESAY,Ann Baldwin 08/30/2013, 3:47 PM

## 2013-08-30 NOTE — Progress Notes (Signed)
[facility] PROGRESS NOTE  Ann Baldwin KDT:[id number] DOB: [birth year] DOA: 08/29/2013 PCP: Lorrin Mais, MD  Assessment/Plan: 1- Enlarging thick-walled enhancing process in the right lower quadrant : is concerning for ovarian cancer recurrence. These process  is immediately adjacent to the enteric colonic anastomoses. Gas collection adjacent to this enlarging enhancing lesion is felt to be redundant postsurgical bowel however cannot exclude abscess.  Discussed case with GYN/Oncology on called. Will ask IR for aspiration and or drain placement. This will need to be send for culture and cytology.  IR consulted.  GYN/Oncology note to follow.  Continue with Ciprofloxacin and Flagyl.  WBC trending down.  Dr Darrold Span to see patient.   2-Anemia; follow hb trend.  3-Diabetes mellitus  Hold glyburide. monitor fsg. continue SSI 4-hypokalemia; repeat B-met pending for this am.   Code Status: Full Code.  Family Communication: care discussed with Patient.  Disposition Plan: Remain inpatient.    Consultants:  GYN/Oncology  Procedures:  none  Antibiotics:  Ciprofloxacin 2-18  Flagyl; 2-18    HPI/Subjective: Abdominal pain improved. Still with nausea. Had Bm last night, watery.   Objective: Filed Vitals:   08/30/13 0504  BP: 143/62  Pulse: 80  Temp: 98.5 F (36.9 C)  Resp:     Intake/Output Summary (Last 24 hours) at 08/30/13 1029 Last data filed at 08/30/13 0900  Gross per 24 hour  Intake    360 ml  Output    350 ml  Net     10 ml   Filed Weights   08/29/13 2004 08/30/13 0504  Weight: 67.586 kg (149 lb) 67.586 kg (149 lb)    Exam:   General:  No distress  Cardiovascular: S 1, S 2 RRR  Respiratory:CTA  Abdomen: Bs present, soft, right side tenderness. No rigidity.   Musculoskeletal: no edema.   Data Reviewed: Basic Metabolic Panel:  Recent Labs Lab 08/29/13 2122  NA 134*  K 3.6*  CL 97  CO2 24  GLUCOSE 138*  BUN 17  CREATININE 0.90   CALCIUM 9.1   Liver Function Tests:  Recent Labs Lab 08/29/13 2122  AST 40*  ALT 32  ALKPHOS 143*  BILITOT 0.5  PROT 6.5  ALBUMIN 2.4*    Recent Labs Lab 08/29/13 2122  LIPASE 11   No results found for this basename: AMMONIA,  in the last 168 hours CBC:  Recent Labs Lab 08/29/13 2122 08/30/13 0742  WBC 13.9* 10.5  NEUTROABS 11.0*  --   HGB 9.4* 8.7*  HCT 29.0* 27.2*  MCV 91.2 91.3  PLT 575* 486*   Cardiac Enzymes: No results found for this basename: CKTOTAL, CKMB, CKMBINDEX, TROPONINI,  in the last 168 hours BNP (last 3 results) No results found for this basename: PROBNP,  in the last 8760 hours CBG:  Recent Labs Lab 08/30/13 0709  GLUCAP 159*    No results found for this or any previous visit (from the past 240 hour(s)).   Studies: Ct Abdomen Pelvis W Contrast  08/29/2013   CLINICAL DATA:  Ovarian carcinoma, right-sided abdominal pain. Multiple bowel surgeries. Who  EXAM: CT ABDOMEN AND PELVIS WITH CONTRAST  TECHNIQUE: Multidetector CT imaging of the abdomen and pelvis was performed using the standard protocol following bolus administration of intravenous contrast.  CONTRAST:  23mL OMNIPAQUE IOHEXOL 300 MG/ML SOLN, OMNIPAQUE IOHEXOL 300 MG/ML SOLN  COMPARISON:  DG ABDOMEN 2V dated 08/28/2013; CT ABD/PELVIS W CM dated 03/16/2013; CT ABD/PELVIS W CM dated 09/23/2012  FINDINGS: New lung bases are  clear.  No pericardial fluid.  No focal hepatic lesion. The gallbladder, pancreas, spleen, adrenal glands, and kidneys are normal.  The stomach and proximal small bowel are normal. Oral contrast flows into the mid small bowel without evidence obstruction or dilatation of the bowel.  Within the right lower quadrant there is a inflammatory process adjacent to the enteric colonic anastomoses. This is increased in volume compared to prior. For example previously there was a small round 2.9 x 2.1 cm enhancing fluid collection where on today's scan there is an approximately 5 .3  x 4.2 cm enhancing collection. This is concerning for ovarian cancer recurrence. Medially along the right lateral ventral of the peritoneal surface is a collection of gas which appears to contained within a lumen 4.2 x 2.2 cm. There is small amount of material within the dependent portion of this gas collection which may represent a enteric contents. There several other small fluid collections which are difficult to tell where within the bowel or extra luminal. .  There is fluid within the transverse and proximal descending colon. Small amount stool in the descending colon. The distal descending colon rectosigmoid colon are collapsed. Contrast does not flow passed the mid small bowel.  Abdominal or is normal caliber. No retroperitoneal periportal lymphadenopathy. No free fluid the pelvis. The bladder is normal. Post hysterectomy anatomy.  IMPRESSION: 1. Enlarging thick-walled enhancing process in the right lower quadrant is concerning for ovarian cancer recurrence. These process is immediately adjacent to the enteric colonic anastomoses. 2. Gas collection adjacent to this enlarging enhancing lesion is felt to be redundant postsurgical bowel however cannot exclude abscess. Delayed imaging through the abdomen could indicate if this gas collection communicate with the bowel. 3. No evidence of high-grade obstruction at the proximal small bowel is non dilated. There is minimal stool in the colon.   Electronically Signed   By: Suzy Bouchard M.D.   On: 08/29/2013 22:56    Scheduled Meds: . aspirin  81 mg Oral q morning - 10a  . atorvastatin  40 mg Oral QHS  . ciprofloxacin  400 mg Intravenous Q12H  . docusate sodium  100 mg Oral BID  . enoxaparin (LOVENOX) injection  40 mg Subcutaneous Q24H  . ezetimibe  10 mg Oral q morning - 10a  . insulin aspart  0-15 Units Subcutaneous TID WC  . metronidazole  500 mg Intravenous Q8H  . polyethylene glycol  17 g Oral Daily   Continuous Infusions: . sodium chloride 125  mL/hr at 08/29/13 2148  . sodium chloride      Principal Problem:   Right lower quadrant abdominal pain Active Problems:   Diabetes mellitus   Hypertension   Hyperlipidemia   Ovarian ca   Personal history of malignant neoplasm of large intestine   Abdominal mass   Abdominal pain    Time spent: 35 minutes.     Danessa Mensch  [facility] Pager [phone]. If 7PM-7AM, please contact night-coverage at [email or web address], password [facility] 08/30/2013, 10:29 AM  LOS: 1 day

## 2013-08-31 ENCOUNTER — Inpatient Hospital Stay (HOSPITAL_COMMUNITY): Payer: Medicare Other

## 2013-08-31 ENCOUNTER — Encounter: Payer: Medicare Other | Admitting: Gynecologic Oncology

## 2013-08-31 ENCOUNTER — Encounter (HOSPITAL_COMMUNITY): Payer: Self-pay | Admitting: Radiology

## 2013-08-31 DIAGNOSIS — E785 Hyperlipidemia, unspecified: Secondary | ICD-10-CM

## 2013-08-31 LAB — CEA: CEA: 0.6 ng/mL (ref 0.0–5.0)

## 2013-08-31 LAB — BASIC METABOLIC PANEL
BUN: 8 mg/dL (ref 6–23)
CHLORIDE: 104 meq/L (ref 96–112)
CO2: 24 mEq/L (ref 19–32)
Calcium: 8.7 mg/dL (ref 8.4–10.5)
Creatinine, Ser: 0.75 mg/dL (ref 0.50–1.10)
GFR calc Af Amer: >90 mL/min (ref >90)
GFR, EST NON AFRICAN AMERICAN: 84 mL/min — AB (ref >90)
GLUCOSE: 138 mg/dL — AB (ref 70–99)
POTASSIUM: 4.2 meq/L (ref 3.7–5.3)
SODIUM: 140 meq/L (ref 137–147)

## 2013-08-31 LAB — CBC
HCT: 28.1 % — ABNORMAL LOW (ref 36.0–46.0)
Hemoglobin: 8.7 g/dL — ABNORMAL LOW (ref 12.0–15.0)
MCH: 28.6 pg (ref 26.0–34.0)
MCHC: 31 g/dL (ref 30.0–36.0)
MCV: 92.4 fL (ref 78.0–100.0)
Platelets: 510 10*3/uL — ABNORMAL HIGH (ref 150–400)
RBC: 3.04 MIL/uL — ABNORMAL LOW (ref 3.87–5.11)
RDW: 13.8 % (ref 11.5–15.5)
WBC: 9.3 10*3/uL (ref 4.0–10.5)

## 2013-08-31 LAB — GLUCOSE, CAPILLARY
GLUCOSE-CAPILLARY: 120 mg/dL — AB (ref 70–99)
Glucose-Capillary: 131 mg/dL — ABNORMAL HIGH (ref 70–99)
Glucose-Capillary: 144 mg/dL — ABNORMAL HIGH (ref 70–99)

## 2013-08-31 LAB — IRON AND TIBC
Iron: 16 ug/dL — ABNORMAL LOW (ref 42–135)
SATURATION RATIOS: 11 % — AB (ref 20–55)
TIBC: 148 ug/dL — ABNORMAL LOW (ref 250–470)
UIBC: 132 ug/dL (ref 125–400)

## 2013-08-31 LAB — FERRITIN: FERRITIN: 727 ng/mL — AB (ref 10–291)

## 2013-08-31 LAB — CA 125: CA 125: 18.1 U/mL (ref 0.0–30.2)

## 2013-08-31 MED ORDER — METRONIDAZOLE IN NACL 5-0.79 MG/ML-% IV SOLN: 500.0000 mg | Freq: Three times a day (TID) | INTRAVENOUS | Status: AC

## 2013-08-31 MED ORDER — CIPROFLOXACIN IN D5W 400 MG/200ML IV SOLN: 400.0000 mg | Freq: Two times a day (BID) | INTRAVENOUS | Status: AC

## 2013-08-31 MED ORDER — HEPARIN SOD (PORK) LOCK FLUSH 100 UNIT/ML IV SOLN: 500.0000 [IU] | INTRAVENOUS | Status: DC | PRN

## 2013-08-31 NOTE — Progress Notes (Signed)
In Patient Consult: Gyn-Onc  Consult requested by Dr. Tyrell Antonio to the evaluation of   Ann Baldwin 68 y.o. female  CC:  Synchronous ovarian/colon cancer. Abdominal pain  Assessment/Plan:  Ann Baldwin  is a 68 y.o.  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  She underwent interval debulking 07/26/2012  and findings were consistent with a synchronous ovarian and ascending colon malignancy. Postoperative course was complicated by either bowel obstruction caused by an internal hernia or ischemia that spontaneously resolved.  The patient completed chemotherapy 12/2012.  She is currently admitted with c/o abdominal pain.  Imaging is notable for marked thickening at the anastomotic site with possible contained anastomotic leak.  Unable to access the area by VIR given the location.  Previous general surgery evaluation at [facility] suggested input from [facility] general surgeons.  Will transfer to [facility]   I HPI: 68 y.o.  y/o G2P2 LNMP 40's  presented in April 2013 with abdominal discomfort, diarrhea, nausea, no constipation  that was thought to be IBD. The symptoms persisted and she was referred for colonoscopy 01/26/2012.  Findings were notable for  A large ulcer with heaped up margins in the ascending colon.  It was difficult to see and to obtain biopsies.    Pathology was c/w adenocarcinoma and the concern that it was not a primary colonic adenocarcinoma.    CT abdomen and pelvis 02/01/2012 with a 5x6.2 cmcomplex solid and cystic left adnexal mass, 3.9x2.9 right ovarian soft tissue mass, omental caking and peritoneal carcinomatosis.    The initial CA 125 returned > 22,000 CEA wnl, immuno stains of the biopsy were not c/w colon cancer.  Cr was > 8 and the patient was admitted with acute renal failure. Cytology from paracentesis was c/w Gyn primary. She has received 6  cycles of dose dense Taxol/carboplatin. Cycle 6was administered on 06/23/2012. CA 125 34.7 06/2012  A CT scan of the abdomen and pelvis collected on 05/12/2012 IMPRESSION:  1. Interval response to therapy. There has been near complete resolution of abdominal ascites and peritoneal nodularity. 2. Interval decrease in the size of bilateral adnexal cystic lesions. 3. Small soft tissue attenuating nodule arising from the uterine fundus is unchanged in size from previous exam and is favored to represent a subserosal (exophytic) fibroid. 4. No new or progressive disease identified. 5. Small subpleural nodule in the right middle lobe is stable from previous exam.   In July 26 2012 she underwent total abdominal hysterectomy bilateral salpingo-oophorectomy omentectomy resection of the midportion of the ascending colon with ileotransverse enterocolostomy. 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  Molecular evaluation of the colon cancer IHC: No loss of expression of MSH2 and MSH6, loss expression of PMS2 and MLH1. ([id number]) MSI PCR: Microsatellite instability-high (BAT-26, NR-21, BAT-25, MONO-27 and NR-24: unstable; [id number]).  Patient declined genetic counselling  The patient has been hospitalized twice with transient RUQ pain.  Imaging CT 03/2013: At the level of the small bowel/right colon anastomoses,  there is thickening of the walls and mild infiltration surrounding fat planes with a 3.2 x 2.5 x 2.2 cm loculated fluid collection. This fluid is in a different position than fluid noted on prior  examination. Etiology indeterminate. As this is immediately adjacent to the anastomoses, this may represent a leak from the anastomoses and small contained  abscess.  WBC at that time was not elevated, pain resolved and she declined further workup  Patient reports persistent nausea since 08/24/2013.  Denies emesis.  Pain worsened and she presented to the ER.  CT 08/31/2013 Again noted is extensive bowel wall thickening in the region of the terminal ileum at its juncture with the cecum and adjacent to anastomotic chain sutures. Prominence of mural fat is noted likely indicating the location of the ileocecal valve, or could be the sequela of chronic inflammation. There is a curvilinear amorphous contrast collection within the right lower quadrant and with adjacent wall thickening. This is also seen on image 30 of the coronal images with associated gas Lab Results  Component Value Date   WBC 9.3 08/31/2013   HGB 8.7* 08/31/2013   HCT 28.1* 08/31/2013   MCV 92.4 08/31/2013   PLT 510* 08/31/2013   Lab Results  Component Value Date   CA125 18.1 08/31/2013   Lab Results  Component Value Date   CEA 0.6 08/31/2013   Pain is persistent, no hematochezia. Significant nausea no vomiting    Past Surgical Hx:  Past Surgical History  Procedure Laterality Date  . Wisdom tooth extraction    . Laparotomy  07/26/2012    Procedure: EXPLORATORY LAPAROTOMY;  Surgeon: Janie Morning, MD PHD;  Location: [facility];  Service: Gynecology;  Laterality: N/A;  Exploratory Laporatomy/Total Abdominal Hysterectomy Bilatderal Salpingo Oopherectomy/Tumor Debulking  . Abdominal hysterectomy  07/26/2012    Procedure: HYSTERECTOMY ABDOMINAL;  Surgeon: Janie Morning, MD PHD;  Location: [facility];  Service: Gynecology;;  . Salpingoophorectomy  07/26/2012    Procedure: SALPINGO OOPHORECTOMY;  Surgeon: Janie Morning, MD PHD;  Location: [facility];  Service: Gynecology;  Laterality: Bilateral;  . Omentectomy  07/26/2012    Procedure: OMENTECTOMY;  Surgeon: Janie Morning, MD PHD;  Location: [facility];  Service: Gynecology;;  . Colostomy revision  07/26/2012    Procedure: COLON RESECTION RIGHT;   Surgeon: Janie Morning, MD PHD;  Location: [facility];  Service: Gynecology;;  with reanastomsis  . Portacath placement  2013  . Colon surgery      Past Medical Hx:  Past Medical History  Diagnosis Date  . Diabetes mellitus   . Hyperlipidemia   . Hypertension     no current meds for since aug 2013  . Anemia   . Ovarian cancer 2013  . Colon cancer 2013    spread from her ovarian cancer  . Blood transfusion without reported diagnosis   . HCAP (healthcare-associated pneumonia) 08/22/2012  . Shortness of breath   Last mammogram 2012  wnl.  Past Gynecological History: G2 P2 LNMP in 40's.  Menarche 12 regular menses until menopause.  No contraceptives used.  No h/o abnormal pap test, Last pap 10 years ago.  NSVD x 2.  No LMP recorded. Patient has had a hysterectomy.  Family Hx:  Family History  Problem Relation Age of Onset  . Colon cancer Neg Hx   . Esophageal cancer Neg Hx   . Stomach cancer Neg Hx   . Rectal cancer Neg Hx   . Kidney cancer Maternal Uncle   . Uterine cancer Sister 24  . Prostate cancer Cousin     early 44s  . Lymphoma Cousin     Hodgkin's Lymphoma  . Lung cancer Cousin     Review of Systems:  Constitutional  Feels  fair, reports weight gain?, good appetite until last Thursday Cardiovascular  No chest pain, shortness of breath,  Pulmonary  Cough productive of clear sputum Gastro Intestinal  Persistent nausea  for one week , no vomiting, no bloating, no bright red blood per rectum.  No constipation, no diarrhea,   Genito Urinary  No vaginal bleeding or discharge ID  no fever or chills  PHYSICAL EXAMINATION Wt Readings from Last 3 Encounters:  08/30/13 149 lb (67.586 kg)  07/24/13 162 lb (73.483 kg)  07/18/13 162 lb (73.483 kg)    WD female in NAD Neck  Supple without any enlargements.  Cardiovascular  Pulse normal rate, regularity and rhythm. Lungs  Clear to auscultation bilateraly, Good air movement.  Psychiatry  Alert and oriented  appropriate mood and affect Abdomen  Normoactive bowel sounds, abdomen soft, non-tender and obese. Surgical  sites intact without evidence of hernia. No ascites or palpable masses.  Right sided abdominal wall tenderness, no rebound Genitalia:  Normal external genitalia  no palpable pelvic masses or nodularity Rectal Good tone no masses no rectovaginal septum nodularity no rectal nodularity Extremities  No bilateral cyanosis, clubbing or edema. No rash, lesions or petiche.    Janie Morning, MD, PhD 08/31/2013, 5:08 PM

## 2013-08-31 NOTE — Progress Notes (Signed)
[facility] PROGRESS NOTE  Ann Baldwin HQI:[id number] DOB: [birth year] DOA: 08/29/2013 PCP: Thora Lance, MD  Assessment/Plan: 1- Enlarging thick-walled enhancing process in the right lower quadrant : is concerning for ovarian cancer recurrence. These process  is immediately adjacent to the enteric colonic anastomoses. Gas collection adjacent to this enlarging enhancing lesion is felt to be redundant postsurgical bowel however cannot exclude abscess.  IR consulted. repeat CT scan today.  There is possibility patient might be transfer to [facility], depending if biopsy can be done here or if she will require surgery. Dr Skeet Latch helping coordinating care.  GYN/Oncology note to follow.  Continue with Ciprofloxacin and Flagyl.  WBC 9.3.   2-Anemia; follow hb trend.  3-Diabetes mellitus  Hold glyburide. monitor fsg. continue SSI 4-hypokalemia; resolved.   Code Status: Full Code.  Family Communication: care discussed with Patient.  Disposition Plan: Remain inpatient.    Consultants:  GYN/Oncology  Procedures:  none  Antibiotics:  Ciprofloxacin 2-18  Flagyl; 2-18    HPI/Subjective: Abdominal pain worse at time, worse on exertion. . Still with nausea.   Objective: Filed Vitals:   08/31/13 0558  BP: 118/73  Pulse: 80  Temp: 97.4 F (36.3 C)  Resp: 18    Intake/Output Summary (Last 24 hours) at 08/31/13 0942 Last data filed at 08/31/13 1324  Gross per 24 hour  Intake   1185 ml  Output   2025 ml  Net   -840 ml   Filed Weights   08/29/13 2004 08/30/13 0504  Weight: 67.586 kg (149 lb) 67.586 kg (149 lb)    Exam:   General:  No distress  Cardiovascular: S 1, S 2 RRR  Respiratory:CTA  Abdomen: Bs present, soft, right side tenderness. No rigidity.   Musculoskeletal: no edema.   Data Reviewed: Basic Metabolic Panel:  Recent Labs Lab 08/29/13 2122 08/30/13 1200 08/31/13 0510  NA 134* 135* 140  K 3.6* 4.0 4.2  CL 97 101 104  CO2 24 23 24    GLUCOSE 138* 268* 138*  BUN 17 13 8   CREATININE 0.90 0.77 0.75  CALCIUM 9.1 8.9 8.7   Liver Function Tests:  Recent Labs Lab 08/29/13 2122  AST 40*  ALT 32  ALKPHOS 143*  BILITOT 0.5  PROT 6.5  ALBUMIN 2.4*    Recent Labs Lab 08/29/13 2122  LIPASE 11   No results found for this basename: AMMONIA,  in the last 168 hours CBC:  Recent Labs Lab 08/29/13 2122 08/30/13 0742 08/31/13 0510  WBC 13.9* 10.5 9.3  NEUTROABS 11.0*  --   --   HGB 9.4* 8.7* 8.7*  HCT 29.0* 27.2* 28.1*  MCV 91.2 91.3 92.4  PLT 575* 486* 510*   Cardiac Enzymes: No results found for this basename: CKTOTAL, CKMB, CKMBINDEX, TROPONINI,  in the last 168 hours BNP (last 3 results) No results found for this basename: PROBNP,  in the last 8760 hours CBG:  Recent Labs Lab 08/30/13 0709 08/30/13 1136 08/30/13 1643 08/31/13 0124 08/31/13 0805  GLUCAP 159* 185* 176* 131* 144*    No results found for this or any previous visit (from the past 240 hour(s)).   Studies: Ct Abdomen Pelvis W Contrast  08/29/2013   CLINICAL DATA:  Ovarian carcinoma, right-sided abdominal pain. Multiple bowel surgeries. Who  EXAM: CT ABDOMEN AND PELVIS WITH CONTRAST  TECHNIQUE: Multidetector CT imaging of the abdomen and pelvis was performed using the standard protocol following bolus administration of intravenous contrast.  CONTRAST:  26mL OMNIPAQUE IOHEXOL 300 MG/ML  SOLN, 134mL OMNIPAQUE IOHEXOL 300 MG/ML SOLN  COMPARISON:  DG ABDOMEN 2V dated 08/28/2013; CT ABD/PELVIS W CM dated 03/16/2013; CT ABD/PELVIS W CM dated 09/23/2012  FINDINGS: New lung bases are clear.  No pericardial fluid.  No focal hepatic lesion. The gallbladder, pancreas, spleen, adrenal glands, and kidneys are normal.  The stomach and proximal small bowel are normal. Oral contrast flows into the mid small bowel without evidence obstruction or dilatation of the bowel.  Within the right lower quadrant there is a inflammatory process adjacent to the enteric colonic  anastomoses. This is increased in volume compared to prior. For example previously there was a small round 2.9 x 2.1 cm enhancing fluid collection where on today's scan there is an approximately 5 .3 x 4.2 cm enhancing collection. This is concerning for ovarian cancer recurrence. Medially along the right lateral ventral of the peritoneal surface is a collection of gas which appears to contained within a lumen 4.2 x 2.2 cm. There is small amount of material within the dependent portion of this gas collection which may represent a enteric contents. There several other small fluid collections which are difficult to tell where within the bowel or extra luminal. .  There is fluid within the transverse and proximal descending colon. Small amount stool in the descending colon. The distal descending colon rectosigmoid colon are collapsed. Contrast does not flow passed the mid small bowel.  Abdominal or is normal caliber. No retroperitoneal periportal lymphadenopathy. No free fluid the pelvis. The bladder is normal. Post hysterectomy anatomy.  IMPRESSION: 1. Enlarging thick-walled enhancing process in the right lower quadrant is concerning for ovarian cancer recurrence. These process is immediately adjacent to the enteric colonic anastomoses. 2. Gas collection adjacent to this enlarging enhancing lesion is felt to be redundant postsurgical bowel however cannot exclude abscess. Delayed imaging through the abdomen could indicate if this gas collection communicate with the bowel. 3. No evidence of high-grade obstruction at the proximal small bowel is non dilated. There is minimal stool in the colon.   Electronically Signed   By: Suzy Bouchard M.D.   On: 08/29/2013 22:56    Scheduled Meds: . atorvastatin  40 mg Oral QHS  . ciprofloxacin  400 mg Intravenous Q12H  . docusate sodium  100 mg Oral BID  . enoxaparin (LOVENOX) injection  40 mg Subcutaneous Q24H  . ezetimibe  10 mg Oral q morning - 10a  . feeding supplement  (RESOURCE BREEZE)  1 Container Oral BID BM  . insulin aspart  0-15 Units Subcutaneous TID WC  . metronidazole  500 mg Intravenous Q8H  . polyethylene glycol  17 g Oral Daily   Continuous Infusions: . sodium chloride 125 mL/hr at 08/29/13 2148  . sodium chloride      Principal Problem:   Abdominal mass Active Problems:   Diabetes mellitus   Hypertension   Hyperlipidemia   Ovarian ca   Personal history of malignant neoplasm of large intestine   Right lower quadrant abdominal pain   Abdominal pain    Time spent: 35 minutes.     Ketan Renz  [facility] Pager [phone]. If 7PM-7AM, please contact night-coverage at [email or web address], password [facility] 08/31/2013, 9:42 AM  LOS: 2 days

## 2013-08-31 NOTE — Discharge Summary (Signed)
Physician Discharge Summary  Ann Baldwin ZOX:[id number] DOB: [birth year] DOA: 08/29/2013  PCP: Thora Lance, MD  Admit date: 08/29/2013 Discharge date: 08/31/2013  Time spent: 35 minutes  Recommendations for Outpatient Follow-up:  1. Patient to be transfer to [facility] for surgical evaluation.   Discharge Diagnoses:    Abdominal mass   Diabetes mellitus   Hypertension   Hyperlipidemia   Ovarian ca   Personal history of malignant neoplasm of large intestine   Right lower quadrant abdominal pain   Abdominal pain   Discharge Condition: Stable.   Diet recommendation: Clear diet.   Filed Weights   08/29/13 2004 08/30/13 0504  Weight: 67.586 kg (149 lb) 67.586 kg (149 lb)    History of present illness:  68 year old female presented to the ED with right lower quadrant abdominal pain for past 3 days.  Patient has history of adenocarcinoma of ascending colon and carcinoma of the bilateral ovaries status post debulking of the ovarian mass and right hemicolectomy. Last chemotherapy in April of 2014. Patient follows with Dr. Marko Plume and Dr. Skeet Latch her gyn surgeon. Patient recently had a followup colonoscopy in one month back which showed no signs of malignancy. ( Follows with Dr. Carlean Purl) patient reports having sharp pain over the right lower quadrant which is nonradiating and occurs several times a day for last 3 days without any aggravating or relieving factors. The pain is 7/10 in severity and associated with nausea but no vomiting. She also reports not having a bowel movement in last 5 days. She reports poor appetite and also about a 5 pound weight loss in past 6-8 weeks.  Patient denies headache, dizziness, fever, chills, vomiting, chest pain, palpitations, SOB, abdominal pain, bowel or urinary symptoms   Hospital Course:  1- Enlarging thick-walled enhancing process in the right lower quadrant : is concerning for ovarian cancer recurrence. These process  is immediately adjacent to the  enteric colonic anastomoses. Gas collection adjacent to this enlarging enhancing lesion is felt to be redundant postsurgical bowel however cannot exclude abscess.  IR consulted. There is possibility patient might be transfer to [facility], depending if biopsy can be done here or if she will require surgery. Dr Skeet Latch helping coordinating care.  GYN/Oncology note to follow.  Continue with Ciprofloxacin and Flagyl.  WBC 9.3.  IR unable to perform biopsy or drainage.  Surgical team in the past has refer patient to [facility] per Dr Skeet Latch.  Plan to transfer patient to [facility], I have started processed. Waiting call back from [facility].  Ct scan; Right lower quadrant terminal ileal bowel wall thickening with an associated amorphous contrast-containing curvilinear structure in  the right lower quadrant with an appearance most typical for enteroenteric fistula. The presence of wall thickening could be benign or malignant in the setting of ovarian cancer and colon cancer. No evidence for intra-abdominal or pelvic metastatic disease elsewhere.   2-Anemia; follow hb trend.  3-Diabetes mellitus  Hold glyburide. monitor fsg. continue SSI  4-hypokalemia; resolved.    Procedures:   Consultations: Dr. Skeet Latch   Discharge Exam: Filed Vitals:   08/31/13 1321  BP: 130/71  Pulse: 78  Temp: 98.1 F (36.7 C)  Resp: 18    General: no distress Cardiovascular: S 1, S 2 RRR Respiratory: CTA  Discharge Instructions   Future Appointments Provider Department Dept Phone   10/05/2013 1:30 PM Janie Morning, MD [facility] [phone]   10/18/2013 1:15 PM [facility] [phone]  10/18/2013 1:30 PM [facility] Nurse [facility] [phone]   10/18/2013 2:00 PM Gordy Levan, MD [facility] [phone]       Medication List    ASK your doctor about these  medications       aspirin 81 MG tablet  Take 81 mg by mouth every morning.     atorvastatin 40 MG tablet  Commonly known as:  LIPITOR  Take 40 mg by mouth at bedtime.     docusate sodium 100 MG capsule  Commonly known as:  COLACE  Take 100 mg by mouth 2 (two) times daily.     ezetimibe 10 MG tablet  Commonly known as:  ZETIA  Take 10 mg by mouth every morning.     glimepiride 1 MG tablet  Commonly known as:  AMARYL  Take 1 mg by mouth daily with breakfast.     insulin regular 100 units/mL injection  Commonly known as:  NOVOLIN R,HUMULIN R  Inject into the skin 3 (three) times daily before meals. Sliding scale only as needed for BS>200     lidocaine-prilocaine cream  Commonly known as:  EMLA  Apply to Porta Cath 1- 2 hours prior to access     LORazepam 0.5 MG tablet  Commonly known as:  ATIVAN  TAKE ONE TAB EVERY SIX HOURS AS NEEDED FOR NAUSEA, ANXIETY, OR SLEEP.     ondansetron 8 MG tablet  Commonly known as:  ZOFRAN  TAKE ONE TABLET BY MOUTH EVERY EIGHT HOURS AS NEEDED FOR NAUSEA     polyethylene glycol packet  Commonly known as:  MIRALAX / GLYCOLAX  Take 17 g by mouth 2 (two) times daily as needed. constipation     traMADol 50 MG tablet  Commonly known as:  ULTRAM  Take 1 tablet (50 mg total) by mouth every 6 (six) hours as needed.       No Known Allergies    The results of significant diagnostics from this hospitalization (including imaging, microbiology, ancillary and laboratory) are listed below for reference.    Significant Diagnostic Studies: Ct Abdomen Pelvis Wo Contrast  08/31/2013   CLINICAL DATA:  Right lower quadrant abdominal pain. History of ovarian cancer. Colon cancer.  EXAM: CT ABDOMEN AND PELVIS WITHOUT CONTRAST  TECHNIQUE: Multidetector CT imaging of the abdomen and pelvis was performed following the standard protocol without intravenous contrast.  COMPARISON:  DG ABD PORTABLE 1V dated 08/31/2013; CT ABD/PELVIS W CM dated 08/29/2013; CT  ABD/PELVIS W CM dated 03/16/2013  FINDINGS: Curvilinear bilateral lower lobe atelectasis is new since the prior study. Trace bilateral pleural fluid.  Gallbladder distension with a dependent gallstone noted without other CT evidence for acute cholecystitis. No common duct dilatation. Unenhanced liver, adrenal glands, spleen, pancreas, and kidneys are normal. Bilateral nonspecific perinephric stranding is reidentified.  Again noted is extensive bowel wall thickening in the region of the terminal ileum at its juncture with the cecum and adjacent to anastomotic chain sutures. Prominence of mural fat is noted likely indicating the location of the ileocecal valve, or could be the sequela of chronic inflammation, for example image 31 coronal reformatted images. There is a curvilinear amorphous contrast collection within the right lower quadrant and with adjacent wall thickening, for example image 39 of the coronal images. This is also seen on image 30 of the coronal images with associated gas. Colon containing ventral abdominal wall hernia is identified.  No free air. No pelvic mass. A small  amount of free fluid is identified. The bladder is unremarkable. Moderate atheromatous aortic calcification without aneurysm. No lytic or sclerotic osseous lesion or acute osseous abnormality. Disc degenerative change is noted.  IMPRESSION: Right lower quadrant terminal ileal bowel wall thickening with an associated amorphous contrast-containing curvilinear structure in the right lower quadrant with an appearance most typical for enteroenteric fistula. The presence of wall thickening could be benign or malignant in the setting of ovarian cancer and colon cancer.  No evidence for intra-abdominal or pelvic metastatic disease elsewhere.  These results will be called to the ordering clinician or representative by the Radiologist Assistant, and communication documented in the PACS Dashboard.   Electronically Signed   By: Conchita Paris M.D.    On: 08/31/2013 13:18   Ct Abdomen Pelvis W Contrast  08/29/2013   CLINICAL DATA:  Ovarian carcinoma, right-sided abdominal pain. Multiple bowel surgeries. Who  EXAM: CT ABDOMEN AND PELVIS WITH CONTRAST  TECHNIQUE: Multidetector CT imaging of the abdomen and pelvis was performed using the standard protocol following bolus administration of intravenous contrast.  CONTRAST:  107mL OMNIPAQUE IOHEXOL 300 MG/ML SOLN, 1105mL OMNIPAQUE IOHEXOL 300 MG/ML SOLN  COMPARISON:  DG ABDOMEN 2V dated 08/28/2013; CT ABD/PELVIS W CM dated 03/16/2013; CT ABD/PELVIS W CM dated 09/23/2012  FINDINGS: New lung bases are clear.  No pericardial fluid.  No focal hepatic lesion. The gallbladder, pancreas, spleen, adrenal glands, and kidneys are normal.  The stomach and proximal small bowel are normal. Oral contrast flows into the mid small bowel without evidence obstruction or dilatation of the bowel.  Within the right lower quadrant there is a inflammatory process adjacent to the enteric colonic anastomoses. This is increased in volume compared to prior. For example previously there was a small round 2.9 x 2.1 cm enhancing fluid collection where on today's scan there is an approximately 5 .3 x 4.2 cm enhancing collection. This is concerning for ovarian cancer recurrence. Medially along the right lateral ventral of the peritoneal surface is a collection of gas which appears to contained within a lumen 4.2 x 2.2 cm. There is small amount of material within the dependent portion of this gas collection which may represent a enteric contents. There several other small fluid collections which are difficult to tell where within the bowel or extra luminal. .  There is fluid within the transverse and proximal descending colon. Small amount stool in the descending colon. The distal descending colon rectosigmoid colon are collapsed. Contrast does not flow passed the mid small bowel.  Abdominal or is normal caliber. No retroperitoneal periportal  lymphadenopathy. No free fluid the pelvis. The bladder is normal. Post hysterectomy anatomy.  IMPRESSION: 1. Enlarging thick-walled enhancing process in the right lower quadrant is concerning for ovarian cancer recurrence. These process is immediately adjacent to the enteric colonic anastomoses. 2. Gas collection adjacent to this enlarging enhancing lesion is felt to be redundant postsurgical bowel however cannot exclude abscess. Delayed imaging through the abdomen could indicate if this gas collection communicate with the bowel. 3. No evidence of high-grade obstruction at the proximal small bowel is non dilated. There is minimal stool in the colon.   Electronically Signed   By: Suzy Bouchard M.D.   On: 08/29/2013 22:56   Dg Abd Portable 1v  08/31/2013   CLINICAL DATA:  Possible small-bowel obstruction, history all partial right colectomy for malignancy  EXAM: PORTABLE ABDOMEN - 1 VIEW  COMPARISON:  CT ABD/PELVIS W CM dated 08/29/2013  FINDINGS: There is a moderate amount  of contrast present from the earlier CT scan throughout the normal calibered colon. There are a few loops of mildly distended gas-filled small bowel in the mid and lower abdomen. There is surgical surgical suture material just above the upper aspect of the right SI joint from the previous colonic anastomosis. There is no free extraluminal gas collection demonstrated. The bony structures exhibit osteopenia. There are degenerative changes of the lumbar spine.  IMPRESSION: The bowel gas pattern suggests an ileus or very minimal partial distal small bowel obstructive process. There is no evidence of perforation or high-grade obstruction.   Electronically Signed   By: David  Martinique   On: 08/31/2013 11:05    Microbiology: No results found for this or any previous visit (from the past 240 hour(s)).   Labs: Basic Metabolic Panel:  Recent Labs Lab 08/29/13 2122 08/30/13 1200 08/31/13 0510  NA 134* 135* 140  K 3.6* 4.0 4.2  CL 97 101 104   CO2 24 23 24   GLUCOSE 138* 268* 138*  BUN 17 13 8   CREATININE 0.90 0.77 0.75  CALCIUM 9.1 8.9 8.7   Liver Function Tests:  Recent Labs Lab 08/29/13 2122  AST 40*  ALT 32  ALKPHOS 143*  BILITOT 0.5  PROT 6.5  ALBUMIN 2.4*    Recent Labs Lab 08/29/13 2122  LIPASE 11   No results found for this basename: AMMONIA,  in the last 168 hours CBC:  Recent Labs Lab 08/29/13 2122 08/30/13 0742 08/31/13 0510  WBC 13.9* 10.5 9.3  NEUTROABS 11.0*  --   --   HGB 9.4* 8.7* 8.7*  HCT 29.0* 27.2* 28.1*  MCV 91.2 91.3 92.4  PLT 575* 486* 510*   Cardiac Enzymes: No results found for this basename: CKTOTAL, CKMB, CKMBINDEX, TROPONINI,  in the last 168 hours BNP: BNP (last 3 results) No results found for this basename: PROBNP,  in the last 8760 hours CBG:  Recent Labs Lab 08/30/13 1136 08/30/13 1643 08/31/13 0124 08/31/13 0805 08/31/13 1135  GLUCAP 185* 176* 131* 144* 120*       Signed:  Ahnesti Townsend  [facility] 08/31/2013, 2:09 PM

## 2013-09-10 ENCOUNTER — Other Ambulatory Visit: Payer: Self-pay | Admitting: Oncology

## 2013-09-28 ENCOUNTER — Ambulatory Visit: Payer: Medicare Other | Admitting: Gynecologic Oncology

## 2013-10-05 ENCOUNTER — Ambulatory Visit: Payer: Medicare Other | Attending: Gynecologic Oncology | Admitting: Gynecologic Oncology

## 2013-10-05 ENCOUNTER — Encounter: Payer: Self-pay | Admitting: Gynecologic Oncology

## 2013-10-05 VITALS — BP 110/70 | HR 120 | Temp 98.6°F | Resp 16 | Ht 62.01 in | Wt 142.0 lb

## 2013-10-05 DIAGNOSIS — C189 Malignant neoplasm of colon, unspecified: Secondary | ICD-10-CM | POA: Insufficient documentation

## 2013-10-05 DIAGNOSIS — Z9221 Personal history of antineoplastic chemotherapy: Secondary | ICD-10-CM | POA: Insufficient documentation

## 2013-10-05 DIAGNOSIS — C182 Malignant neoplasm of ascending colon: Secondary | ICD-10-CM

## 2013-10-05 DIAGNOSIS — R110 Nausea: Secondary | ICD-10-CM

## 2013-10-05 DIAGNOSIS — C569 Malignant neoplasm of unspecified ovary: Secondary | ICD-10-CM | POA: Insufficient documentation

## 2013-10-05 DIAGNOSIS — E119 Type 2 diabetes mellitus without complications: Secondary | ICD-10-CM | POA: Insufficient documentation

## 2013-10-05 DIAGNOSIS — Z9079 Acquired absence of other genital organ(s): Secondary | ICD-10-CM | POA: Insufficient documentation

## 2013-10-05 DIAGNOSIS — E785 Hyperlipidemia, unspecified: Secondary | ICD-10-CM | POA: Insufficient documentation

## 2013-10-05 DIAGNOSIS — Z90710 Acquired absence of both cervix and uterus: Secondary | ICD-10-CM | POA: Insufficient documentation

## 2013-10-05 MED ORDER — ONDANSETRON HCL 8 MG PO TABS: 8.0000 mg | ORAL_TABLET | Freq: Two times a day (BID) | ORAL | Status: AC

## 2013-10-05 NOTE — Patient Instructions (Signed)
Follow up at [facility] as scheduled.  Call for any questions or concerns.

## 2013-10-05 NOTE — Progress Notes (Signed)
GYN ONCOLOGY OFFICE VISIT    Ann Baldwin 68 y.o. female  CC:  Synchronous ovarian/colon cancer.  Assessment/Plan:  [name]  is a 68 y.o.  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  She underwent interval debulking 07/26/2012  and findings were consistent with a synchronous ovarian and ascending colon malignancy. Postoperative course was complicated by either bowel obstruction caused by an internal hernia or ischemia that spontaneously resolved.  The patient completed chemotherapy 12/2012.   Follow-up at [facility] for excision of the inflammatory area of the colon. Follow-up with Gyn Oncology in 3 months  I HPI: 68 y.o.  y/o G2P2 LNMP 40's  presented in April 2013 with abdominal discomfort, diarrhea, nausea, no constipation  that was thought to be IBD. The symptoms persisted and she was referred for colonoscopy 01/26/2012.  Findings were notable for  A large ulcer with heaped up margins in the ascending colon.  It was difficult to see and to obtain biopsies.    Pathology was c/w adenocarcinoma and the concern that it was not a primary colonic adenocarcinoma.    CT abdomen and pelvis 02/01/2012 with a 5x6.2 cmcomplex solid and cystic left adnexal mass, 3.9x2.9 right ovarian soft tissue mass, omental caking and peritoneal carcinomatosis.    The initial CA 125 returned > 22,000 CEA wnl, immuno stains of the biopsy were not c/w colon cancer.  Cr was > 8 and the patient was admitted with acute renal failure. Cytology from paracentesis was c/w Gyn primary. She has received 6 cycles of dose dense Taxol/carboplatin. Cycle 6was administered on 06/23/2012. CA 125 34.7 06/2012  A CT scan of the abdomen and pelvis collected on 05/12/2012 IMPRESSION:  1. Interval response to therapy. There has been near complete resolution of abdominal ascites and peritoneal nodularity.  2. Interval decrease in the size of bilateral adnexal cystic lesions. 3. Small soft tissue attenuating nodule arising from the uterine fundus is unchanged in size from previous exam and is favored to represent a subserosal (exophytic) fibroid. 4. No new or progressive disease identified. 5. Small subpleural nodule in the right middle lobe is stable from previous exam.   In July 26 2012 she underwent total abdominal hysterectomy bilateral salpingo-oophorectomy omentectomy resection of the midportion of the ascending colon with ileotransverse enterocolostomy. 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  Molecular evaluation of the colon cancer IHC: No loss of expression of MSH2 and MSH6, loss expression of PMS2 and MLH1. ([id number]) MSI PCR: Microsatellite instability-high (BAT-26, NR-21, BAT-25, MONO-27 and NR-24: unstable; [id number]).  Patient declined genetic counselling  The patient has been hospitalized twice with transient RUQ pain.  Imaging CT 03/2013: At the level of the small bowel/right colon anastomoses,  there is thickening of the walls and mild infiltration surrounding fat planes with a 3.2 x 2.5 x 2.2 cm loculated fluid collection. This fluid is in a different position than fluid noted on prior examination. Etiology indeterminate. As this is immediately adjacent to the anastomoses, this may represent a leak from the anastomoses and small contained abscess.  WBC at that time was not elevated, pain resolved and she declined further workup  Patient reported persistent nausea since 08/24/2013.  Denies emesis.  Pain worsened and she presented to the ER 08/2013  CT 08/31/2013 Again noted is extensive bowel wall thickening in the region of the terminal  ileum at its juncture with the cecum and adjacent to anastomotic chain sutures. Prominence of mural fat is noted likely indicating the location of the ileocecal valve, or could be the sequela of chronic inflammation. There is a curvilinear amorphous contrast collection within the right lower quadrant and with adjacent wall thickening. This is also seen on image 30 of the coronal images with associated gas.   Lab Results  Component Value Date   CA125 18.1 08/31/2013   Lab Results  Component Value Date   CEA 0.6 08/31/2013    Patient was transferred to [facility] colonoscopy without evidence of malignancy, Biopsies of the inflammatory area around the anastomosis is  without evidence of malignancy.  The anastomotic site will be revised by Dr. Launa Flight.    Patient reports am nausea.  Mild right sided pain.  Past Surgical Hx:  Past Surgical History  Procedure Laterality Date  . Wisdom tooth extraction    . Laparotomy  07/26/2012    Procedure: EXPLORATORY LAPAROTOMY;  Surgeon: Janie Morning, MD PHD;  Location: [facility];  Service: Gynecology;  Laterality: N/A;  Exploratory Laporatomy/Total Abdominal Hysterectomy Bilatderal Salpingo Oopherectomy/Tumor Debulking  . Abdominal hysterectomy  07/26/2012    Procedure: HYSTERECTOMY ABDOMINAL;  Surgeon: Janie Morning, MD PHD;  Location: [facility];  Service: Gynecology;;  . Salpingoophorectomy  07/26/2012    Procedure: SALPINGO OOPHORECTOMY;  Surgeon: Janie Morning, MD PHD;  Location: [facility];  Service: Gynecology;  Laterality: Bilateral;  . Omentectomy  07/26/2012    Procedure: OMENTECTOMY;  Surgeon: Janie Morning, MD PHD;  Location: [facility];  Service: Gynecology;;  . Colostomy revision  07/26/2012    Procedure: COLON RESECTION RIGHT;  Surgeon: Janie Morning, MD PHD;  Location: [facility];  Service: Gynecology;;  with reanastomsis  . Portacath placement  2013  . Colon surgery      Past Medical Hx:  Past Medical History  Diagnosis Date  . Diabetes mellitus   .  Hyperlipidemia   . Hypertension     no current meds for since aug 2013  . Anemia   . Ovarian cancer 2013  . Colon cancer 2013    spread from her ovarian cancer  . Blood transfusion without reported diagnosis   . HCAP (healthcare-associated pneumonia) 08/22/2012  . Shortness of breath   Last mammogram 2012  wnl.  Past Gynecological History: G2 P2 LNMP in 40's.  Menarche 12 regular menses until menopause.  No contraceptives used.  No h/o abnormal pap test, Last pap 10 years ago.  NSVD x 2.  No LMP recorded. Patient has had a hysterectomy.  Family Hx:  Family History  Problem Relation Age of Onset  . Colon cancer Neg Hx   . Esophageal cancer Neg Hx   . Stomach cancer Neg Hx   . Rectal cancer Neg Hx   . Kidney cancer Maternal Uncle   . Uterine cancer Sister 78  . Prostate cancer Cousin     early 75s  . Lymphoma Cousin     Hodgkin's Lymphoma  . Lung cancer Cousin     Review of Systems:  Constitutional  Feels fair Cardiovascular  No chest pain, shortness of breath,  Pulmonary  Cough productive of clear sputum Gastro Intestinal  Persistent nausea  Every morning, no vomiting, no bloating, no bright red blood per rectum.  No constipation, no diarrhea,  Noted hernia recently Genito Urinary  No vaginal bleeding or discharge ID  no fever or chills  PHYSICAL EXAMINATION Wt Readings from Last 3 Encounters:  10/05/13 142 lb (64.411 kg)  08/30/13 149 lb (67.586 kg)  07/24/13 162 lb (73.483 kg)   BP 110/70  Pulse 120  Temp(Src) 98.6 F (37 C)  Resp 16  Ht 5' 2.01" (1.575 m)  Wt 142 lb (64.411 kg)  BMI 25.97 kg/m2  SpO2 100%  WD female in NAD Neck  Supple without any enlargements.  Cardiovascular  Pulse normal rate, regularity and rhythm. Lungs  Clear to auscultation bilateraly, Good air movement.  Psychiatry  Alert and oriented appropriate mood and affect and judgement Abdomen  Normoactive bowel sounds, abdomen soft, non-tender and obese. Surgical  sites intact  without evidence of hernia. No ascites or palpable masses.  Right sided abdominal wall firmness. Reducible ventral hernia appreciated. Genitalia:  Normal external genitalia  no palpable pelvic masses or nodularity Rectal Good tone no masses no rectovaginal septum nodularity no rectal nodularity Extremities  No bilateral cyanosis, clubbing or edema. No rash, lesions or petiche.    Janie Morning, MD, PhD 10/05/2013, 2:51 PM

## 2013-10-09 ENCOUNTER — Telehealth: Payer: Self-pay | Admitting: *Deleted

## 2013-10-09 NOTE — Telephone Encounter (Signed)
Pt will have surgery at [facility] on April 9th, letter for pt's daughter in law to be sent to [phone] as she will need to be going with pt to [facility].

## 2013-10-15 ENCOUNTER — Other Ambulatory Visit: Payer: Self-pay | Admitting: Oncology

## 2013-10-17 ENCOUNTER — Telehealth: Payer: Self-pay

## 2013-10-17 NOTE — Telephone Encounter (Signed)
Granddaughter Herbert Spires stated that Ms. Oviedo is an inpatient at [facility] in [place].  She was admitted on 10-10-13 to help build up her strength prior to surgery. The surgery is scheduled for Thursday 10-19-13.  She will be in the hospital ~ another week post -op.   Will cancell appointment with Dr. Marko Plume for 10-18-13.   Will discuss with Dr. Marko Plume When to reschedule appointment. Tori verbalized understanding.

## 2013-10-17 NOTE — Telephone Encounter (Signed)
Message copied by Baruch Merl on Tue Oct 17, 2013 12:00 PM ------      Message from: Gordy Levan      Created: Sun Oct 15, 2013  3:30 PM       Has apt with [facility] + PAC flush 4-8.            Looks like she is to have or has had surgery at [facility] by Dr Launa Flight for inflammatory mass at colon      Please see if the surgery has happened - hopefully Ann Baldwin can get [facility] information including op note and path if so      If surgery is pending, I could move my apt out later if she is doing ok, or can keep it if she is having problems.            If PAC has been used recently, can move next flush to 6-8 weeks from last use (Last in EMR is 2-11, ? used during hospitalizations [facility] and [facility] late Feb?). ------

## 2013-10-18 ENCOUNTER — Other Ambulatory Visit: Payer: Medicare Other

## 2013-10-18 ENCOUNTER — Ambulatory Visit: Payer: Medicare Other | Admitting: Oncology

## 2013-10-20 ENCOUNTER — Telehealth: Payer: Self-pay | Admitting: *Deleted

## 2013-10-20 NOTE — Telephone Encounter (Signed)
Ann Baldwin called to advise [employer] did not receive letter for her to be out with pt. Pt requesting for new letter with new out of work dates being April 8-10. Letter created and faxed to [employer] [phone] per [facility] request.

## 2013-10-30 ENCOUNTER — Other Ambulatory Visit: Payer: Self-pay | Admitting: Oncology

## 2013-10-30 DIAGNOSIS — C569 Malignant neoplasm of unspecified ovary: Secondary | ICD-10-CM

## 2013-10-30 NOTE — Progress Notes (Signed)
Medical Oncology  Message from Dr Skeet Latch that patient found to have recurrent ovarian cancer at surgery done at [facility], and needs return apt here. POF to schedulers.  Evlyn Clines, MD

## 2013-10-31 ENCOUNTER — Telehealth: Payer: Self-pay | Admitting: Oncology

## 2013-10-31 NOTE — Telephone Encounter (Signed)
, °

## 2013-11-06 ENCOUNTER — Telehealth: Payer: Self-pay

## 2013-11-06 NOTE — Telephone Encounter (Signed)
Spoke with Ann Baldwin and rescheduled Ann Baldwin appointment with Ann Baldwin to 11-30-13 as Ann Baldwin just out of the hospital from surgery at [facility]. Ann Baldwin will relay appointment date and time to Ann Baldwin.  Told Ann Baldwin to call the [facility] if 11-30-13 appointment does not suit Ann Baldwin.

## 2013-11-08 ENCOUNTER — Other Ambulatory Visit: Payer: Medicare Other

## 2013-11-08 ENCOUNTER — Ambulatory Visit: Payer: Medicare Other | Admitting: Oncology

## 2013-11-26 ENCOUNTER — Other Ambulatory Visit: Payer: Self-pay | Admitting: Oncology

## 2013-11-30 ENCOUNTER — Other Ambulatory Visit: Payer: Medicare Other

## 2013-11-30 ENCOUNTER — Ambulatory Visit: Payer: Medicare Other | Admitting: Oncology

## 2013-11-30 ENCOUNTER — Encounter: Payer: Self-pay | Admitting: Oncology

## 2013-11-30 NOTE — Progress Notes (Signed)
Medical Oncology  Patient had been scheduled to see me today, but no longer on office schedule; no note in EMR in this regard. She is scheduled to see Dr Skeet Latch on 01-04-14. I am glad to see her before or after Dr Leone Brand appointment if requested.  L.Livesay MD

## 2014-01-04 ENCOUNTER — Ambulatory Visit: Payer: Medicare Other | Admitting: Gynecologic Oncology

## 2014-03-06 ENCOUNTER — Telehealth: Payer: Self-pay

## 2014-03-06 NOTE — Telephone Encounter (Signed)
Ann Baldwin is currently under Dr. Ova Freshwater care. Suzan Garibaldi PA. With Dr. Polly Cobia- [phone].     Faxed Op note from 07-26-12 surgery and path note., Genetic testing  results from 8-14, hospital note from 2-15 sting chemotherapy received at [facility].

## 2014-06-09 IMAGING — CT CT ABD-PELV W/O CM
1 of 4 series · 14 of 32 positions shown, 18 images · IV contrast (agent unspecified)
Comparison: CT of the abdomen pelvis 02/01/2012

***ADDENDUM*** CREATED: 02/11/2012 [DATE]

The patient has new renal failure. No evidence for hydronephrosis.
There is a persistent nephrogram on this noncontrast exam, a
finding which can be associated with contrast nephropathy.  The
patient was last given intravenous contrast on 02/01/2012.
CLINICAL DATA: Abdominal pain.  Diarrhea, nausea.  Colonoscopy
[DATE] showed a large ulceration in the ascending colon, pathology
showing adenocarcinoma.
CT ABDOMEN AND PELVIS WITHOUT CONTRAST
TECHNIQUE: Multidetector CT imaging of the abdomen and pelvis was
performed following the standard protocol without intravenous
contrast.

[Series 2: abd/pel w/o · axial · non-contrast · 0.74mm/px · z∈[-585,-175]mm · 14 of 92 slices shown, 18 images]
[im 5/92  soft-tissue]
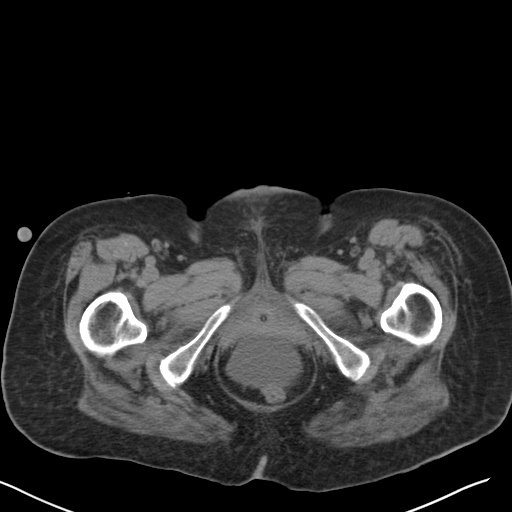
[im 5/92  bone]
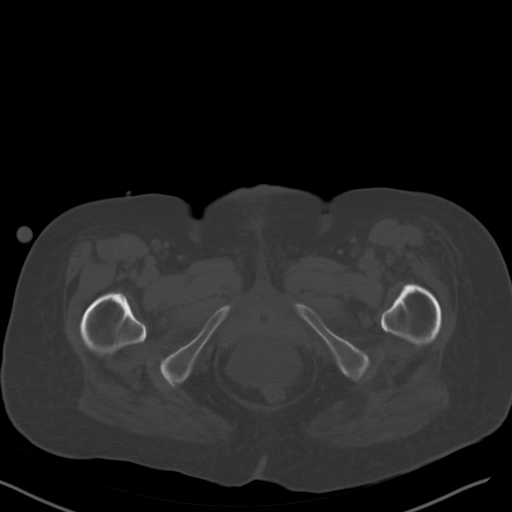
[im 13/92  soft-tissue]
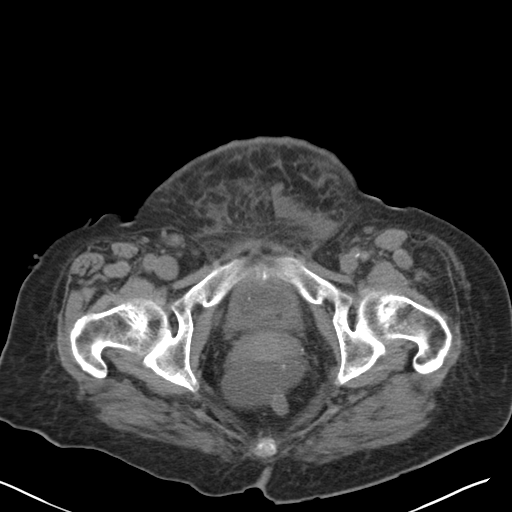
[im 21/92  soft-tissue]
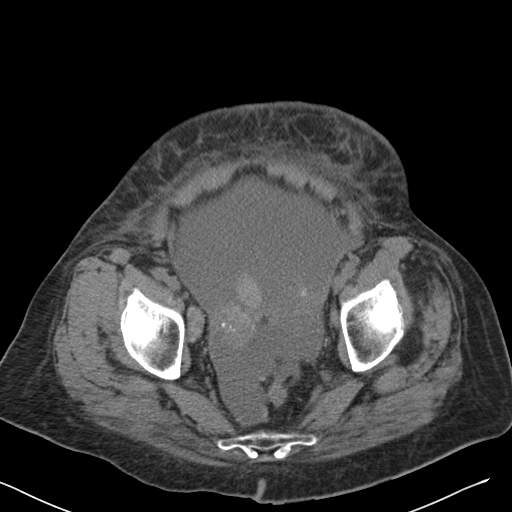
[im 29/92  soft-tissue]
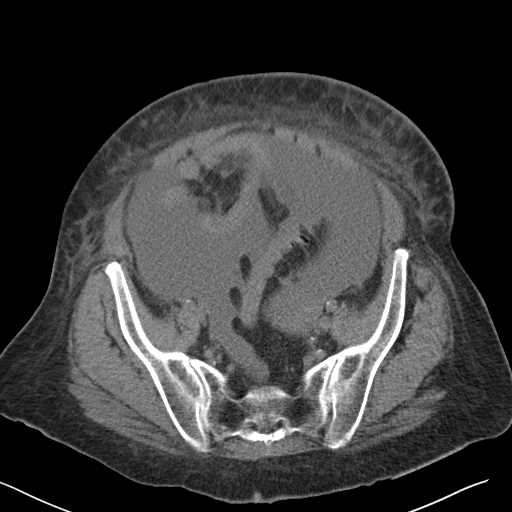
[im 34/92  soft-tissue]
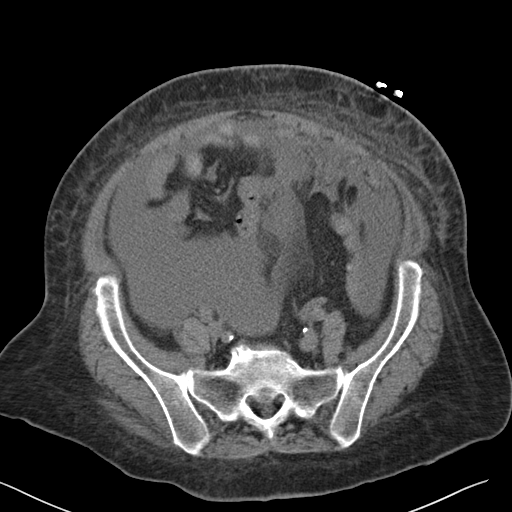
[im 42/92  soft-tissue]
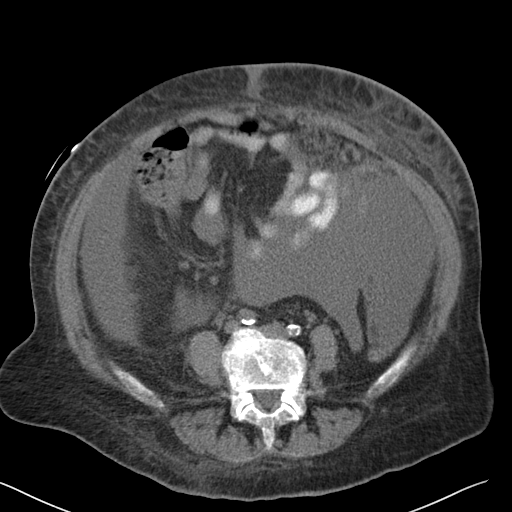
[im 50/92  soft-tissue]
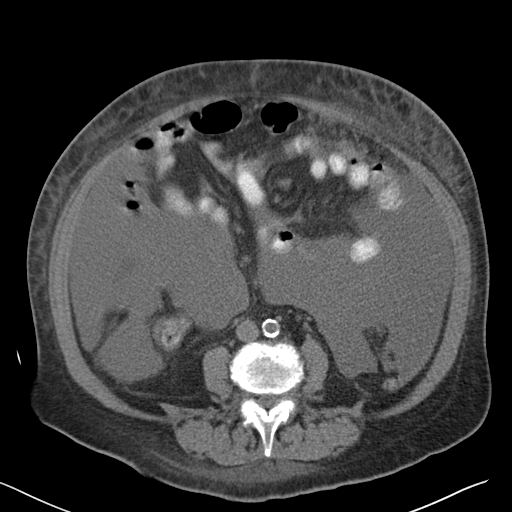
[im 58/92  soft-tissue]
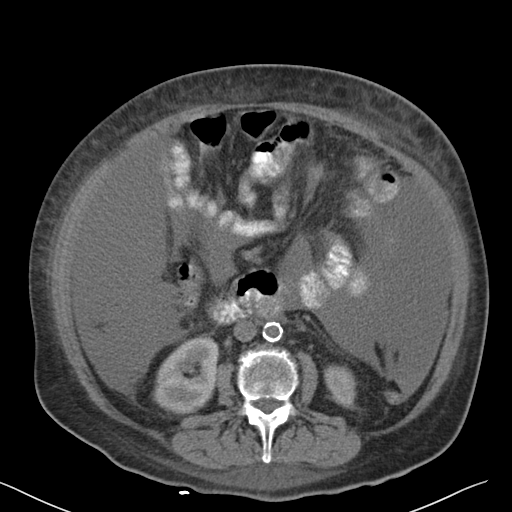
[im 63/92  soft-tissue]
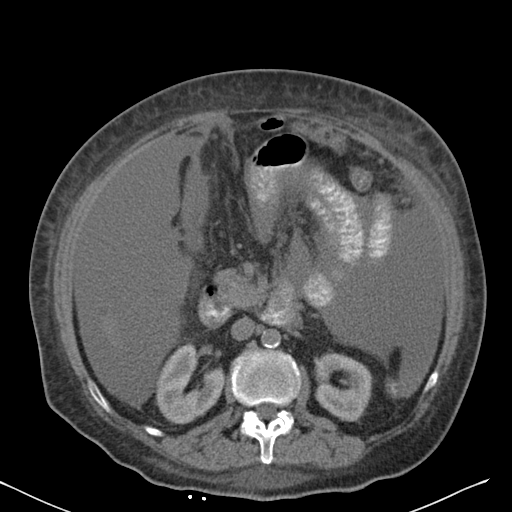
[im 63/92  bone]
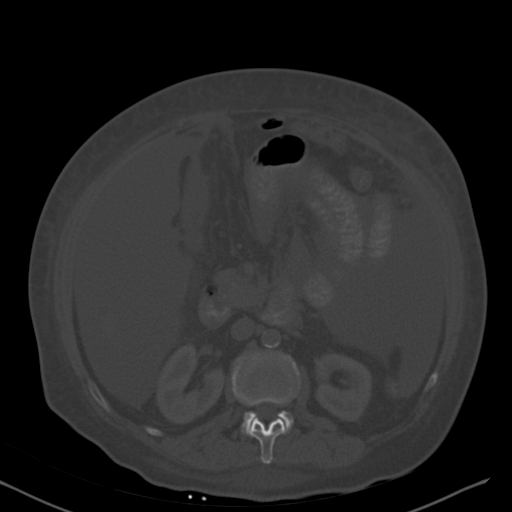
[im 71/92  soft-tissue]
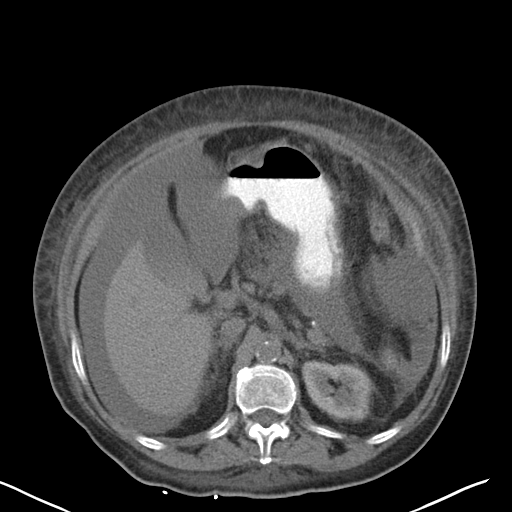
[im 75/92  lung]
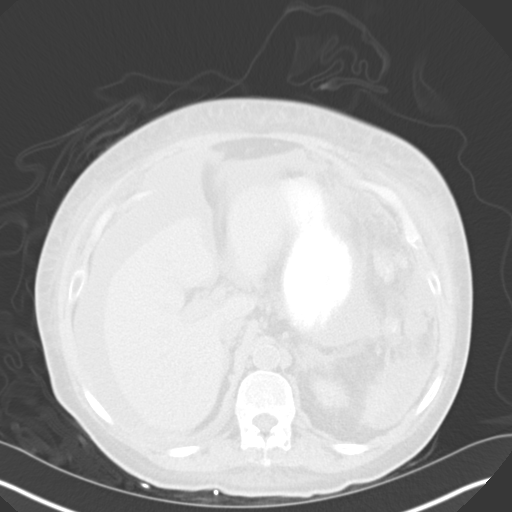
[im 79/92  soft-tissue]
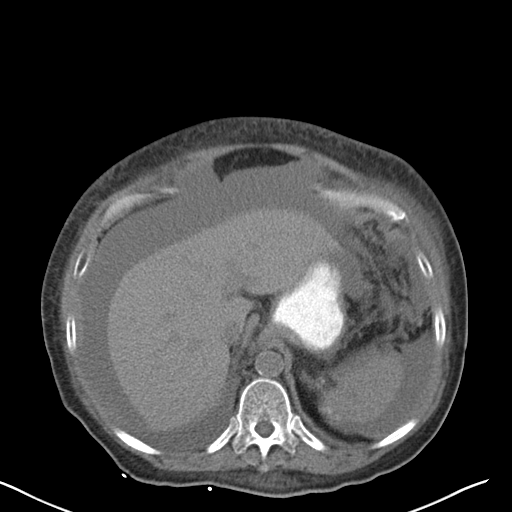
[im 79/92  lung]
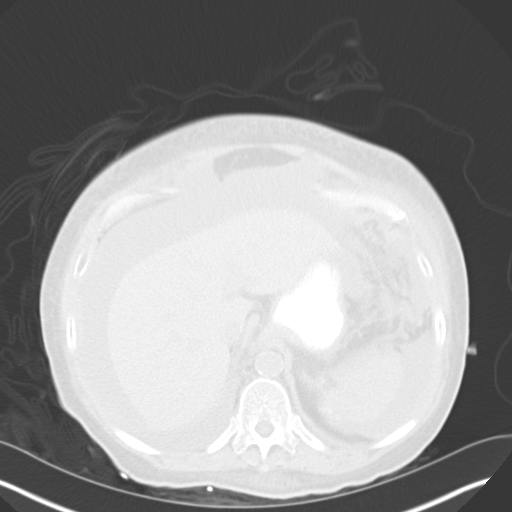
[im 83/92  lung]
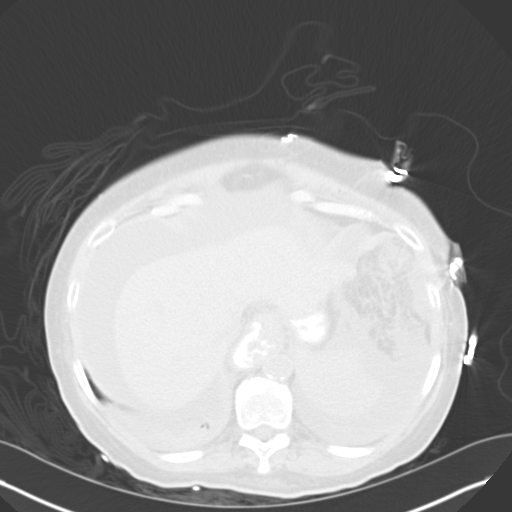
[im 87/92  soft-tissue]
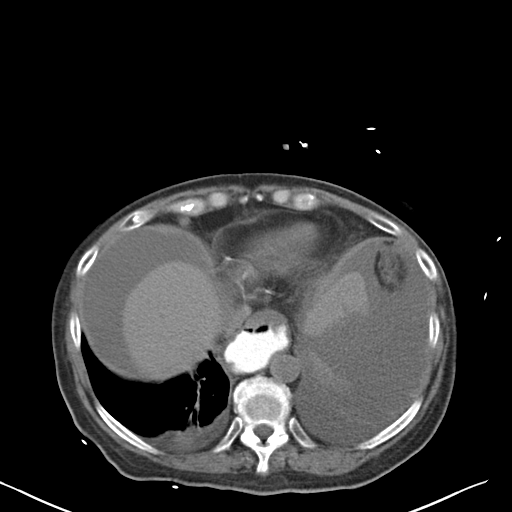
[im 87/92  lung]
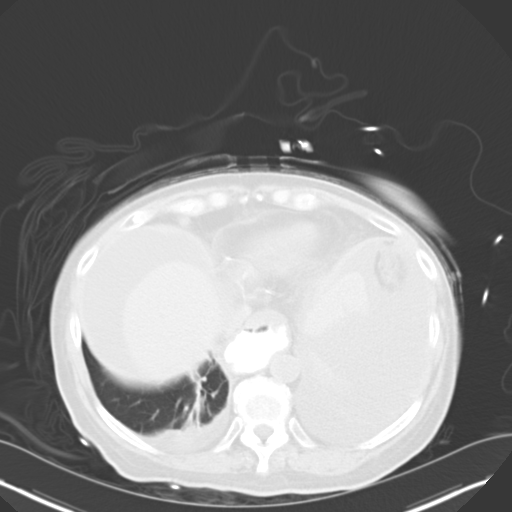

[14 of 32 positions shown; findings below may reference images not displayed]

FINDINGS: There are bilateral pleural effusions, left greater than
right.  Bibasilar atelectasis/consolidation is present, increased
since the prior study.

As identified previously, there is a large amount of ascites.
Peritoneal nodules and omental stranding are again identified,
consistent with carcinomatosis.

The liver shows no focal lesions.  No focal abnormality identified
within the spleen, pancreas, or adrenal glands.

A hiatal hernia is present.  Small bowel loops show no obstruction
although surrounded by mesenteric edema and ascites.

The gallbladder contains dependent, calcified gallstone.

Colonic loops are relatively collapsed.

Within the pelvis, solid and cystic adnexal masses are again noted,
unchanged in appearance.  Uterine fibroids are present.  A Foley
catheter decompresses the bladder.  There is diffuse body wall
edema.  Degenerative changes are seen in the spine.  There is dense
atherosclerotic calcification of the abdominal aorta but no
aneurysm.
IMPRESSION: 1.  Large amount of ascites, peritoneal and omental disease,
consistent with carcinomatosis.
2.  No evidence for bowel obstruction.
3.  Complex bilateral ovarian masses.  Differential diagnosis
includes primary ovarian malignancy or ovarian metastases, which
can originate from other sites such as stomach, pancreas, and colon
primaries.

## 2014-06-12 DEATH — deceased

## 2015-01-18 IMAGING — XA IR FLUORO GUIDE CV LINE*L*
1 series · 1 of 1 positions shown · non-contrast
Comparison: none

CLINICAL DATA: ovarian cancer, access for chemotherapy

[Series 300: line placements · 1 of 1 slices shown]
[im 1/1]
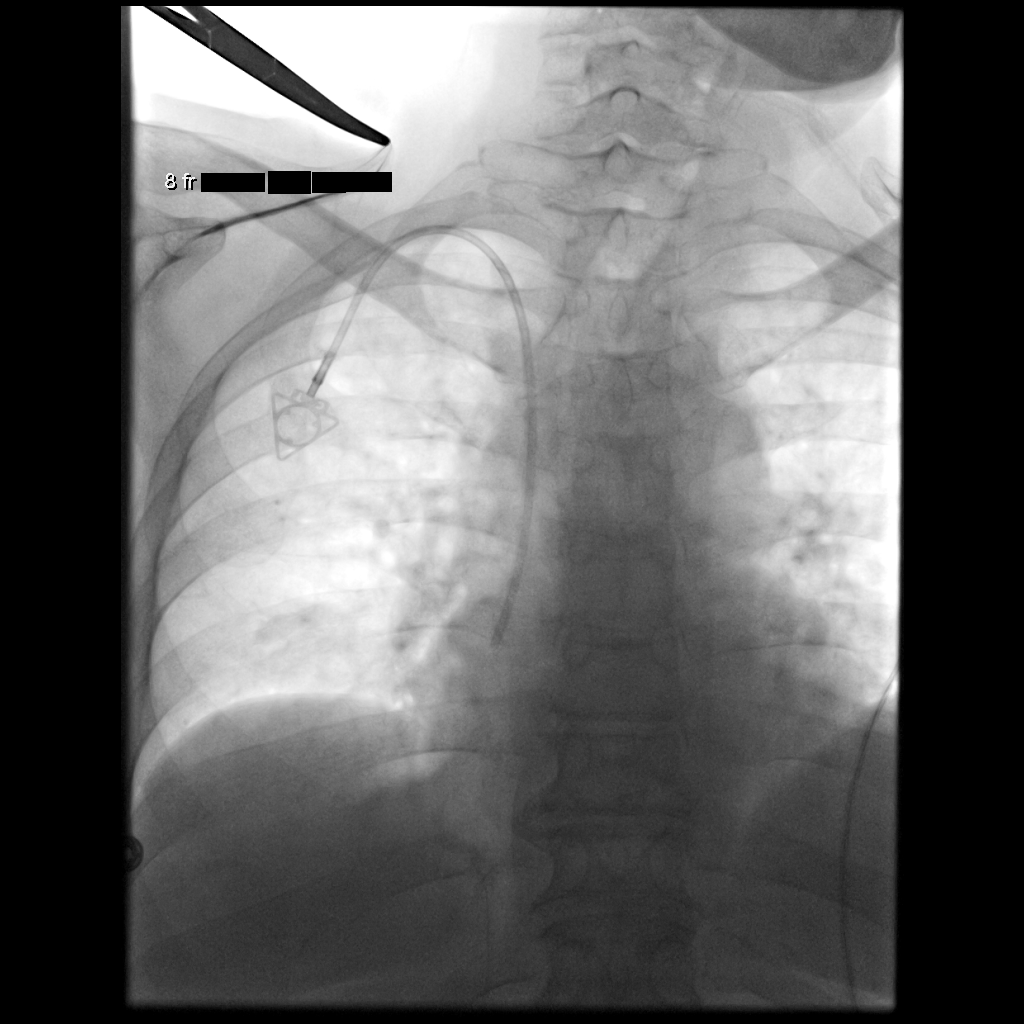

[1 of 1 positions shown; findings below may reference images not displayed]

ULTRASOUND GUIDANCE FOR VASCULAR ACCESS
RIGHT INTERNAL JUGULAR SINGLE LUMEN POWER PORT CATHETER INSERTION

Date: 09/20/2012 [DATE]

Radiologist:  Alex Von Luniak, M.D.

Medications:  1 gram ancefadministered within 1 hour of the
procedure.2 mg Versed, 100 mcg Fentanyl

Guidance:  Ultrasound and fluoroscopic

Fluoroscopy time:  0.7 minutes

Sedation time:  30 minutes

Contrast volume:  None.

Complications:  No immediate

PROCEDURE/FINDINGS:

Informed consent was obtained from the patient following
explanation of the procedure, risks, benefits and alternatives.
The patient understands, agrees and consents for the procedure.
All questions were addressed.  A time out was performed.

Maximal barrier sterile technique utilized including caps, mask,
sterile gowns, sterile gloves, large sterile drape, hand hygiene,
and 2% chlorhexidine scrub.

Under sterile conditions and local anesthesia, right internal
jugular micropuncture venous access was performed.  Access was
performed with ultrasound.  Images were obtained for documentation.
A guide wire was inserted followed by a transitional dilator.  This
allowed insertion of a guide wire and catheter into the IVC.
Measurements were obtained from the SVC / RA junction back to the
right IJ venotomy site.  In the right infraclavicular chest, a
subcutaneous pocket was created over the second anterior rib.  This
was done under sterile conditions and local anesthesia.  1%
lidocaine with epinephrine was utilized for this.  A 2.5 cm
incision was made in the skin.  Blunt dissection was performed to
create a subcutaneous pocket over the right pectoralis major
muscle.  The pocket was flushed with saline vigorously.  There was
adequate hemostasis.  The port catheter was assembled and checked
for leakage.  The port catheter was secured in the pocket with two
retention sutures.  The tubing was tunneled subcutaneously to the
right venotomy site and inserted into the SVC/RA junction through a
valved peel-away sheath.  Position was confirmed with fluoroscopy.
Images were obtained for documentation.  The patient tolerated the
procedure well.  No immediate complications.  Incisions were closed
in a two layer fashion with 4 - 0 Vicryl suture.  Dermabond was
applied to the skin. The port catheter was accessed, blood was
aspirated followed by saline and heparin flushes.  Needle was
removed.  A dry sterile dressing was applied.
IMPRESSION: Ultrasound and fluoroscopically guided right internal jugular
single lumen power port catheter insertion.  Tip in the SVC/RA
junction.  Catheter ready for use.

## 2016-07-27 ENCOUNTER — Encounter: Payer: Self-pay | Admitting: Internal Medicine

## 2016-07-31 ENCOUNTER — Other Ambulatory Visit: Payer: Self-pay | Admitting: Nurse Practitioner
